# Patient Record
Sex: Female | Born: 1935 | Race: White | Hispanic: No | Smoking: Former smoker
Health system: Southern US, Community
[De-identification: ages and names within clinical notes are randomized; demographics above are authoritative.]

## PROBLEM LIST (undated history)

## (undated) DIAGNOSIS — E785 Hyperlipidemia, unspecified: Secondary | ICD-10-CM

## (undated) DIAGNOSIS — C801 Malignant (primary) neoplasm, unspecified: Secondary | ICD-10-CM

## (undated) DIAGNOSIS — D649 Anemia, unspecified: Secondary | ICD-10-CM

## (undated) DIAGNOSIS — F419 Anxiety disorder, unspecified: Secondary | ICD-10-CM

## (undated) DIAGNOSIS — S6290XA Unspecified fracture of unspecified wrist and hand, initial encounter for closed fracture: Secondary | ICD-10-CM

## (undated) DIAGNOSIS — F32A Depression, unspecified: Secondary | ICD-10-CM

## (undated) DIAGNOSIS — E119 Type 2 diabetes mellitus without complications: Secondary | ICD-10-CM

## (undated) DIAGNOSIS — I10 Essential (primary) hypertension: Secondary | ICD-10-CM

## (undated) DIAGNOSIS — F329 Major depressive disorder, single episode, unspecified: Secondary | ICD-10-CM

## (undated) DIAGNOSIS — C50912 Malignant neoplasm of unspecified site of left female breast: Secondary | ICD-10-CM

## (undated) HISTORY — DX: Unspecified fracture of unspecified wrist and hand, initial encounter for closed fracture: S62.90XA

## (undated) HISTORY — DX: Anemia, unspecified: D64.9

## (undated) HISTORY — PX: MASTECTOMY: SHX3

## (undated) HISTORY — PX: APPENDECTOMY: SHX54

## (undated) HISTORY — DX: Malignant neoplasm of unspecified site of left female breast: C50.912

---

## 2010-08-19 ENCOUNTER — Other Ambulatory Visit: Payer: Self-pay | Admitting: Anesthesiology

## 2010-08-19 ENCOUNTER — Encounter (HOSPITAL_COMMUNITY): Payer: Medicare Other

## 2010-08-19 LAB — BASIC METABOLIC PANEL
BUN: 24 mg/dL — ABNORMAL HIGH (ref 6–23)
CO2: 21 mEq/L (ref 19–32)
Calcium: 10.8 mg/dL — ABNORMAL HIGH (ref 8.4–10.5)
Chloride: 101 mEq/L (ref 96–112)
Creatinine, Ser: 1.08 mg/dL (ref 0.4–1.2)

## 2010-08-26 ENCOUNTER — Ambulatory Visit (HOSPITAL_COMMUNITY)
Admission: RE | Admit: 2010-08-26 | Discharge: 2010-08-26 | Disposition: A | Discharge status: Home / Self Care | Payer: Medicare Other | Source: Ambulatory Visit | Attending: Ophthalmology | Admitting: Ophthalmology

## 2010-08-26 DIAGNOSIS — Z7982 Long term (current) use of aspirin: Secondary | ICD-10-CM | POA: Insufficient documentation

## 2010-08-26 DIAGNOSIS — H2510 Age-related nuclear cataract, unspecified eye: Secondary | ICD-10-CM | POA: Insufficient documentation

## 2010-08-26 DIAGNOSIS — Z01812 Encounter for preprocedural laboratory examination: Secondary | ICD-10-CM | POA: Insufficient documentation

## 2010-08-26 DIAGNOSIS — E119 Type 2 diabetes mellitus without complications: Secondary | ICD-10-CM | POA: Insufficient documentation

## 2010-08-26 DIAGNOSIS — I10 Essential (primary) hypertension: Secondary | ICD-10-CM | POA: Insufficient documentation

## 2010-08-26 DIAGNOSIS — Z79899 Other long term (current) drug therapy: Secondary | ICD-10-CM | POA: Insufficient documentation

## 2010-08-26 HISTORY — PX: CATARACT EXTRACTION W/ INTRAOCULAR LENS IMPLANT: SHX1309

## 2010-09-03 ENCOUNTER — Emergency Department (HOSPITAL_COMMUNITY)
Admission: EM | Admit: 2010-09-03 | Discharge: 2010-09-03 | Disposition: A | Discharge status: Home / Self Care | Payer: Medicare Other | Attending: Emergency Medicine | Admitting: Emergency Medicine

## 2010-09-03 ENCOUNTER — Emergency Department (HOSPITAL_COMMUNITY): Payer: Medicare Other

## 2010-09-03 DIAGNOSIS — E7800 Pure hypercholesterolemia, unspecified: Secondary | ICD-10-CM | POA: Insufficient documentation

## 2010-09-03 DIAGNOSIS — E119 Type 2 diabetes mellitus without complications: Secondary | ICD-10-CM | POA: Insufficient documentation

## 2010-09-03 DIAGNOSIS — F329 Major depressive disorder, single episode, unspecified: Secondary | ICD-10-CM | POA: Insufficient documentation

## 2010-09-03 DIAGNOSIS — E860 Dehydration: Secondary | ICD-10-CM | POA: Insufficient documentation

## 2010-09-03 DIAGNOSIS — I10 Essential (primary) hypertension: Secondary | ICD-10-CM | POA: Insufficient documentation

## 2010-09-03 DIAGNOSIS — Z882 Allergy status to sulfonamides status: Secondary | ICD-10-CM | POA: Insufficient documentation

## 2010-09-03 DIAGNOSIS — Z881 Allergy status to other antibiotic agents status: Secondary | ICD-10-CM | POA: Insufficient documentation

## 2010-09-03 DIAGNOSIS — F3289 Other specified depressive episodes: Secondary | ICD-10-CM | POA: Insufficient documentation

## 2010-09-03 LAB — DIFFERENTIAL
Basophils Absolute: 0 K/uL (ref 0.0–0.1)
Basophils Relative: 0 % (ref 0–1)
Eosinophils Absolute: 0 10*3/uL (ref 0.0–0.7)
Eosinophils Relative: 0 % (ref 0–5)
Lymphocytes Relative: 21 % (ref 12–46)
Lymphs Abs: 1.3 10*3/uL (ref 0.7–4.0)
Monocytes Absolute: 0.5 10*3/uL (ref 0.1–1.0)
Monocytes Relative: 8 % (ref 3–12)
Neutro Abs: 4.4 K/uL (ref 1.7–7.7)
Neutrophils Relative %: 71 % (ref 43–77)

## 2010-09-03 LAB — CK TOTAL AND CKMB (NOT AT ARMC)
CK, MB: 1.4 ng/mL (ref 0.3–4.0)
Relative Index: INVALID (ref 0.0–2.5)
Total CK: 44 U/L (ref 7–177)

## 2010-09-03 LAB — URINALYSIS, ROUTINE W REFLEX MICROSCOPIC
Bilirubin Urine: NEGATIVE
Glucose, UA: NEGATIVE mg/dL
Hgb urine dipstick: NEGATIVE
Ketones, ur: NEGATIVE mg/dL
Leukocytes, UA: NEGATIVE
Nitrite: NEGATIVE
Protein, ur: NEGATIVE mg/dL
Specific Gravity, Urine: >1.03 — ABNORMAL HIGH (ref 1.005–1.030)
Urobilinogen, UA: 0.2 mg/dL (ref 0.0–1.0)
pH: 5.5 (ref 5.0–8.0)

## 2010-09-03 LAB — CBC
HCT: 35.3 % — ABNORMAL LOW (ref 36.0–46.0)
Hemoglobin: 12.2 g/dL (ref 12.0–15.0)
MCH: 31.4 pg (ref 26.0–34.0)
MCHC: 34.6 g/dL (ref 30.0–36.0)
MCV: 91 fL (ref 78.0–100.0)
Platelets: 200 10*3/uL (ref 150–400)
RBC: 3.88 MIL/uL (ref 3.87–5.11)
RDW: 13.4 % (ref 11.5–15.5)
WBC: 6.2 K/uL (ref 4.0–10.5)

## 2010-09-03 LAB — BASIC METABOLIC PANEL
BUN: 18 mg/dL (ref 6–23)
Calcium: 10.6 mg/dL — ABNORMAL HIGH (ref 8.4–10.5)
GFR calc Af Amer: 50 mL/min — ABNORMAL LOW (ref >60)
GFR calc non Af Amer: 41 mL/min — ABNORMAL LOW (ref >60)
Potassium: 4.2 mEq/L (ref 3.5–5.1)

## 2010-09-03 LAB — BASIC METABOLIC PANEL WITH GFR
CO2: 22 meq/L (ref 19–32)
Chloride: 96 meq/L (ref 96–112)
Creatinine, Ser: 1.26 mg/dL — ABNORMAL HIGH (ref 0.50–1.10)
Glucose, Bld: 142 mg/dL — ABNORMAL HIGH (ref 70–99)
Sodium: 131 meq/L — ABNORMAL LOW (ref 135–145)

## 2010-09-03 LAB — HEPATIC FUNCTION PANEL
ALT: 32 U/L (ref 0–35)
AST: 27 U/L (ref 0–37)
Albumin: 4 g/dL (ref 3.5–5.2)
Alkaline Phosphatase: 75 U/L (ref 39–117)
Bilirubin, Direct: 0.1 mg/dL (ref 0.0–0.3)
Indirect Bilirubin: 0.6 mg/dL (ref 0.3–0.9)
Total Bilirubin: 0.7 mg/dL (ref 0.3–1.2)
Total Protein: 7.7 g/dL (ref 6.0–8.3)

## 2010-09-03 LAB — TROPONIN I: Troponin I: <0.3 ng/mL (ref <0.30)

## 2010-09-04 ENCOUNTER — Other Ambulatory Visit (HOSPITAL_COMMUNITY): Payer: Self-pay

## 2010-09-05 ENCOUNTER — Inpatient Hospital Stay (HOSPITAL_COMMUNITY): Payer: Medicare Other

## 2010-09-05 ENCOUNTER — Emergency Department (HOSPITAL_COMMUNITY): Payer: Medicare Other

## 2010-09-05 ENCOUNTER — Inpatient Hospital Stay (HOSPITAL_COMMUNITY)
Admission: EM | Admit: 2010-09-05 | Discharge: 2010-09-09 | DRG: 629 | Disposition: A | Discharge status: Home / Self Care | Payer: Medicare Other | Attending: Internal Medicine | Admitting: Internal Medicine

## 2010-09-05 DIAGNOSIS — E119 Type 2 diabetes mellitus without complications: Secondary | ICD-10-CM | POA: Diagnosis present

## 2010-09-05 DIAGNOSIS — D649 Anemia, unspecified: Secondary | ICD-10-CM | POA: Diagnosis present

## 2010-09-05 DIAGNOSIS — R509 Fever, unspecified: Secondary | ICD-10-CM | POA: Diagnosis present

## 2010-09-05 DIAGNOSIS — C50919 Malignant neoplasm of unspecified site of unspecified female breast: Secondary | ICD-10-CM | POA: Diagnosis present

## 2010-09-05 DIAGNOSIS — N179 Acute kidney failure, unspecified: Secondary | ICD-10-CM | POA: Diagnosis present

## 2010-09-05 DIAGNOSIS — D696 Thrombocytopenia, unspecified: Secondary | ICD-10-CM | POA: Diagnosis present

## 2010-09-05 DIAGNOSIS — I10 Essential (primary) hypertension: Secondary | ICD-10-CM | POA: Diagnosis present

## 2010-09-05 DIAGNOSIS — E871 Hypo-osmolality and hyponatremia: Principal | ICD-10-CM | POA: Diagnosis present

## 2010-09-05 DIAGNOSIS — E785 Hyperlipidemia, unspecified: Secondary | ICD-10-CM | POA: Diagnosis present

## 2010-09-05 DIAGNOSIS — F341 Dysthymic disorder: Secondary | ICD-10-CM | POA: Diagnosis present

## 2010-09-05 HISTORY — PX: INCISIONAL BREAST BIOPSY: SHX1812

## 2010-09-05 LAB — DIFFERENTIAL
Basophils Absolute: 0 10*3/uL (ref 0.0–0.1)
Basophils Relative: 1 % (ref 0–1)
Eosinophils Absolute: 0.1 10*3/uL (ref 0.0–0.7)
Eosinophils Relative: 2 % (ref 0–5)
Neutrophils Relative %: 58 % (ref 43–77)

## 2010-09-05 LAB — COMPREHENSIVE METABOLIC PANEL
Albumin: 3.4 g/dL — ABNORMAL LOW (ref 3.5–5.2)
BUN: 22 mg/dL (ref 6–23)
CO2: 23 mEq/L (ref 19–32)
Chloride: 94 mEq/L — ABNORMAL LOW (ref 96–112)
Creatinine, Ser: 1.35 mg/dL — ABNORMAL HIGH (ref 0.50–1.10)
GFR calc Af Amer: 46 mL/min — ABNORMAL LOW (ref >60)
GFR calc non Af Amer: 38 mL/min — ABNORMAL LOW (ref >60)
Glucose, Bld: 139 mg/dL — ABNORMAL HIGH (ref 70–99)
Total Bilirubin: 0.7 mg/dL (ref 0.3–1.2)

## 2010-09-05 LAB — URINALYSIS, ROUTINE W REFLEX MICROSCOPIC
Bilirubin Urine: NEGATIVE
Glucose, UA: NEGATIVE mg/dL
Ketones, ur: NEGATIVE mg/dL
Leukocytes, UA: NEGATIVE
Nitrite: NEGATIVE
Specific Gravity, Urine: 1.025 (ref 1.005–1.030)
Urobilinogen, UA: 0.2 mg/dL (ref 0.0–1.0)
pH: 5 (ref 5.0–8.0)

## 2010-09-05 LAB — LIPASE, BLOOD: Lipase: 42 U/L (ref 11–59)

## 2010-09-05 LAB — CK TOTAL AND CKMB (NOT AT ARMC)
CK, MB: 1.4 ng/mL (ref 0.3–4.0)
Relative Index: INVALID (ref 0.0–2.5)
Total CK: 49 U/L (ref 7–177)

## 2010-09-05 LAB — CBC
HCT: 36 % (ref 36.0–46.0)
Hemoglobin: 12.4 g/dL (ref 12.0–15.0)
MCH: 31.5 pg (ref 26.0–34.0)
MCHC: 34.4 g/dL (ref 30.0–36.0)
MCV: 91.4 fL (ref 78.0–100.0)
Platelets: 101 K/uL — ABNORMAL LOW (ref 150–400)
RBC: 3.94 MIL/uL (ref 3.87–5.11)
RDW: 13.5 % (ref 11.5–15.5)
WBC: 5.4 K/uL (ref 4.0–10.5)

## 2010-09-05 LAB — TROPONIN I: Troponin I: <0.3 ng/mL (ref <0.30)

## 2010-09-05 LAB — GLUCOSE, CAPILLARY: Glucose-Capillary: 148 mg/dL — ABNORMAL HIGH (ref 70–99)

## 2010-09-06 LAB — CBC
HCT: 30.3 % — ABNORMAL LOW (ref 36.0–46.0)
MCH: 31.4 pg (ref 26.0–34.0)
MCHC: 34.7 g/dL (ref 30.0–36.0)
MCV: 90.7 fL (ref 78.0–100.0)
Platelets: 83 10*3/uL — ABNORMAL LOW (ref 150–400)
RDW: 13.5 % (ref 11.5–15.5)
WBC: 4.2 10*3/uL (ref 4.0–10.5)

## 2010-09-06 LAB — GLUCOSE, CAPILLARY
Glucose-Capillary: 143 mg/dL — ABNORMAL HIGH (ref 70–99)
Glucose-Capillary: 119 mg/dL — ABNORMAL HIGH (ref 70–99)
Glucose-Capillary: 99 mg/dL (ref 70–99)

## 2010-09-06 LAB — BASIC METABOLIC PANEL
BUN: 21 mg/dL (ref 6–23)
Chloride: 102 mEq/L (ref 96–112)
GFR calc Af Amer: 46 mL/min — ABNORMAL LOW (ref >60)
GFR calc non Af Amer: 38 mL/min — ABNORMAL LOW (ref >60)
Potassium: 4.2 mEq/L (ref 3.5–5.1)
Sodium: 134 mEq/L — ABNORMAL LOW (ref 135–145)

## 2010-09-06 LAB — DIFFERENTIAL
Eosinophils Absolute: 0 10*3/uL (ref 0.0–0.7)
Eosinophils Relative: 0 % (ref 0–5)
Lymphocytes Relative: 27 % (ref 12–46)
Lymphs Abs: 1.2 10*3/uL (ref 0.7–4.0)
Monocytes Absolute: 0.3 10*3/uL (ref 0.1–1.0)

## 2010-09-06 LAB — HEMOGLOBIN A1C: Mean Plasma Glucose: 134 mg/dL — ABNORMAL HIGH (ref <117)

## 2010-09-06 LAB — HEPATIC FUNCTION PANEL
AST: 65 U/L — ABNORMAL HIGH (ref 0–37)
Bilirubin, Direct: 0.3 mg/dL (ref 0.0–0.3)
Indirect Bilirubin: 0.6 mg/dL (ref 0.3–0.9)
Total Bilirubin: 0.9 mg/dL (ref 0.3–1.2)

## 2010-09-06 LAB — FOLATE: Folate: 19.6 ng/mL

## 2010-09-06 LAB — TSH: TSH: 0.898 u[IU]/mL (ref 0.350–4.500)

## 2010-09-06 NOTE — Group Therapy Note (Signed)
  NAME:  Stacy [name], Stacy [name]                 ACCOUNT NO.:  [id number]  MEDICAL RECORD NO.:  [id number]  LOCATION:  A301                          FACILITY:  [facility]  PHYSICIAN:  Wilson Singer, M.D.DATE OF BIRTH:  October 08, 1931  DATE OF PROCEDURE:  09/06/2010 DATE OF DISCHARGE:                                PROGRESS NOTE   HISTORY OF PRESENT ILLNESS:  This 77 year old lady with a history of diabetes, was admitted with weakness, nausea, and vomiting.  She was found to have a left breast mass by Dr. Sherrie Mustache and indeed CT scan of her chest yesterday confirmed that there is a mass-like density in the left breast, upper inner quadrant with extension into the overlying skin, left nipple retraction and possible extension to the underlying pectoralis muscle.  There is also an abnormal left axillary lymph node. This is concerning for breast cancer.  She herself feels better today with no nausea or vomiting and she has been afebrile.  PHYSICAL EXAMINATION:  Temperature 98.5, blood pressure 100/66, pulse 74, saturation 93% on room air.  She does look systemically well. HEART:  Sounds are present and normal.  Lung fields are clear.  ABDOMEN: Soft and nontender.  She appeared to be keen to examine her left breast again today.  On investigations, sodium 134, potassium 4.2, bicarbonate 22, BUN 21, creatinine 1.36, calcium 9.0, albumin is 3.0.  AST and ALT slightly elevated at 65 and 58 respectively.  Hemoglobin 10.5, white blood cell count 4.2, platelets 83.  CT scan of her abdomen was rather unremarkable with no acute intra-abdominal pelvic process and no evidence of hepatic breast metastases or any other lymphadenopathy.  IMPRESSION: 1. Nausea, vomiting with fever, unclear etiology, improving. 2. Hyponatremia, improving with rehydration. 3. Acute renal failure, improving with rehydration. 4. Thrombocytopenia.  We will monitor. 5. Left breast mass concerning for malignancy. 6. Hypertension, well  controlled.  PLAN: 1. Surgical consultation as I really think is malignancy and I think a     mammogram may not add much to the management here. 2. Continue with intravenous antibiotics and IV fluids for the time     being and monitor diabetes.     Wilson Singer, M.D.     NCG/MEDQ  D:  09/06/2010  T:  09/06/2010  Job:  [id number]  Electronically Signed by Lilly Cove M.D. on 09/06/2010 11:33:55 AM

## 2010-09-07 LAB — BASIC METABOLIC PANEL
BUN: 17 mg/dL (ref 6–23)
CO2: 21 mEq/L (ref 19–32)
Chloride: 103 mEq/L (ref 96–112)
Creatinine, Ser: 1.39 mg/dL — ABNORMAL HIGH (ref 0.50–1.10)
GFR calc Af Amer: 45 mL/min — ABNORMAL LOW (ref >60)
Glucose, Bld: 113 mg/dL — ABNORMAL HIGH (ref 70–99)
Potassium: 4 mEq/L (ref 3.5–5.1)

## 2010-09-07 LAB — URINE CULTURE
Colony Count: NO GROWTH
Culture  Setup Time: 201206230120

## 2010-09-07 LAB — CBC
HCT: 27 % — ABNORMAL LOW (ref 36.0–46.0)
Hemoglobin: 9.4 g/dL — ABNORMAL LOW (ref 12.0–15.0)
MCV: 90.6 fL (ref 78.0–100.0)
RBC: 2.98 MIL/uL — ABNORMAL LOW (ref 3.87–5.11)
WBC: 4.2 10*3/uL (ref 4.0–10.5)

## 2010-09-07 LAB — OSMOLALITY, URINE: Osmolality, Ur: 525 mOsm/kg (ref 390–1090)

## 2010-09-07 LAB — DIFFERENTIAL
Basophils Relative: 2 % — ABNORMAL HIGH (ref 0–1)
Eosinophils Relative: 0 % (ref 0–5)
Lymphocytes Relative: 46 % (ref 12–46)
Monocytes Absolute: 0.5 10*3/uL (ref 0.1–1.0)
Monocytes Relative: 11 % (ref 3–12)
Neutrophils Relative %: 41 % — ABNORMAL LOW (ref 43–77)

## 2010-09-07 LAB — GLUCOSE, CAPILLARY: Glucose-Capillary: 110 mg/dL — ABNORMAL HIGH (ref 70–99)

## 2010-09-07 LAB — SURGICAL PCR SCREEN: Staphylococcus aureus: NEGATIVE

## 2010-09-07 NOTE — Group Therapy Note (Signed)
  [name], [name]                 ACCOUNT NO.:  [id number]  MEDICAL RECORD NO.:  [id number]  LOCATION:                                 FACILITY:  PHYSICIAN:  Wilson Singer, M.D.DATE OF BIRTH:  06/04/1931  DATE OF PROCEDURE:  09/07/2010 DATE OF DISCHARGE:                                PROGRESS NOTE   This lady feels better.  She had fevers yesterday and I elected to switch her to doxycycline as the antibiotic of choice and stop the other antibiotics.  She has had no further fevers since that time and she actually feels much improved.  I appreciate Dr. Leticia [name] seeing this patient and from what I can make out, it appears that she may be proceeding to a breast biopsy.  Also, he has asked Oncology to see her which I think is highly reasonable.  PHYSICAL EXAMINATION:  VITAL SIGNS:  Temperature 97.8, blood pressure 98/58, pulse 99, saturation 94% on room air. GENERAL:  She looks systemically well and is not clinically shocked despite the soft blood pressure. LUNGS:  Lung fields are clear. HEART:  Heart sounds are present and normal. BREASTS:  Examination of the left breast does show a fairly hard left breast lump consistent with malignancy.  INVESTIGATIONS:  Sodium stable at 133, potassium 4.0, bicarbonate 21, BUN 17, creatinine 1.39.  Hemoglobin 9.4, white blood cell count 4.2, platelets 70; also stable.  Hemoglobin A1c is 6.3% and her CBGs are actually quite reasonable.  IMPRESSION: 1. Nausea and vomiting, resolved. 2. Fever, defervesced for the time being. 3. Hyponatremia, improving. 4. Acute renal failure, stable now. 5. Thrombocytopenia, stable but we will need to monitor. 6. Probable left breast cancer.  PLAN: 1. Decrease intravenous fluids to [facility] and encourage oral fluids. 2. Possible breast mass biopsy tomorrow. 3. I agree with Oncology consultation at this stage.     Wilson Singer, M.D.     NCG/MEDQ  D:  09/07/2010  T:  09/07/2010  Job:   [id number]  Electronically Signed by Lilly Cove M.D. on 09/07/2010 10:45:26 AM

## 2010-09-08 DIAGNOSIS — G939 Disorder of brain, unspecified: Secondary | ICD-10-CM

## 2010-09-08 LAB — COMPREHENSIVE METABOLIC PANEL
AST: 67 U/L — ABNORMAL HIGH (ref 0–37)
Albumin: 2.6 g/dL — ABNORMAL LOW (ref 3.5–5.2)
Alkaline Phosphatase: 65 U/L (ref 39–117)
BUN: 15 mg/dL (ref 6–23)
CO2: 22 mEq/L (ref 19–32)
Chloride: 108 mEq/L (ref 96–112)
Creatinine, Ser: 1.22 mg/dL — ABNORMAL HIGH (ref 0.50–1.10)
GFR calc non Af Amer: 43 mL/min — ABNORMAL LOW (ref >60)
Potassium: 4.1 mEq/L (ref 3.5–5.1)
Total Bilirubin: 0.3 mg/dL (ref 0.3–1.2)

## 2010-09-08 LAB — DIFFERENTIAL
Basophils Absolute: 0.1 10*3/uL (ref 0.0–0.1)
Basophils Relative: 1 % (ref 0–1)
Eosinophils Absolute: 0 10*3/uL (ref 0.0–0.7)
Monocytes Absolute: 0.9 10*3/uL (ref 0.1–1.0)
Monocytes Relative: 18 % — ABNORMAL HIGH (ref 3–12)
Neutrophils Relative %: 26 % — ABNORMAL LOW (ref 43–77)

## 2010-09-08 LAB — GLUCOSE, CAPILLARY
Glucose-Capillary: 118 mg/dL — ABNORMAL HIGH (ref 70–99)
Glucose-Capillary: 139 mg/dL — ABNORMAL HIGH (ref 70–99)

## 2010-09-08 LAB — CBC
MCH: 31.2 pg (ref 26.0–34.0)
MCV: 90.6 fL (ref 78.0–100.0)
Platelets: 77 10*3/uL — ABNORMAL LOW (ref 150–400)
RDW: 13.8 % (ref 11.5–15.5)

## 2010-09-08 LAB — LACTATE DEHYDROGENASE: LDH: 308 U/L — ABNORMAL HIGH (ref 94–250)

## 2010-09-08 NOTE — Group Therapy Note (Signed)
  Stacy [name], Stacy [name]                 ACCOUNT NO.:  [id number]  MEDICAL RECORD NO.:  [id number]  LOCATION:  A301                          FACILITY:  [facility]  PHYSICIAN:  Wilson Singer, M.D.DATE OF BIRTH:  1931-09-23  DATE OF PROCEDURE:  09/08/2010 DATE OF DISCHARGE:                                PROGRESS NOTE   SUBJECTIVE:  This lady is due to have a left breast biopsy today by Dr. Leticia Penna.  She feels much better.  She has had no fevers in the last 24- 36 hours now.  She is eating well.  OBJECTIVE:  Vital Signs:  Temperature 98, blood pressure 132/69, pulse 68, saturation 98% on room air.  There are no new physical findings today.  Investigations:  Hemoglobin 9.6, white blood cell count 4.9, platelets 77, which is improving slowly.  Sodium 138, potassium 4.1, bicarbonate 22, BUN 15, creatinine almost normal at 1.22.  IMPRESSION: 1. Hyponatremia, resolved. 2. Fever, resolved. 3. Acute renal failure, improved. 4. Thrombocytopenia, improving. 5. Left breast mass, probably cancer.  Biopsy today.  PLAN: 1. Breast biopsy today. 2. Possible discharge to home today or tomorrow depending on clinical     course today.     Wilson Singer, M.D.     NCG/MEDQ  D:  09/08/2010  T:  09/08/2010  Job:  [id number]  Electronically Signed by Lilly Cove M.D. on 09/08/2010 11:03:00 AM

## 2010-09-09 ENCOUNTER — Ambulatory Visit (HOSPITAL_COMMUNITY): Admission: RE | Admit: 2010-09-09 | Payer: Medicare Other | Source: Ambulatory Visit | Admitting: Ophthalmology

## 2010-09-09 ENCOUNTER — Inpatient Hospital Stay (HOSPITAL_COMMUNITY): Payer: Medicare Other

## 2010-09-09 ENCOUNTER — Other Ambulatory Visit: Payer: Self-pay | Admitting: General Surgery

## 2010-09-09 DIAGNOSIS — N63 Unspecified lump in unspecified breast: Secondary | ICD-10-CM

## 2010-09-09 LAB — GLUCOSE, CAPILLARY: Glucose-Capillary: 137 mg/dL — ABNORMAL HIGH (ref 70–99)

## 2010-09-09 LAB — DIFFERENTIAL
Eosinophils Relative: 1 % (ref 0–5)
Lymphocytes Relative: 66 % — ABNORMAL HIGH (ref 12–46)
Monocytes Absolute: 0.6 10*3/uL (ref 0.1–1.0)
Monocytes Relative: 12 % (ref 3–12)
Neutro Abs: 1 10*3/uL — ABNORMAL LOW (ref 1.7–7.7)

## 2010-09-09 LAB — COMPREHENSIVE METABOLIC PANEL
ALT: 60 U/L — ABNORMAL HIGH (ref 0–35)
AST: 61 U/L — ABNORMAL HIGH (ref 0–37)
Albumin: 2.6 g/dL — ABNORMAL LOW (ref 3.5–5.2)
Calcium: 10.1 mg/dL (ref 8.4–10.5)
Chloride: 108 mEq/L (ref 96–112)
Creatinine, Ser: 1.11 mg/dL — ABNORMAL HIGH (ref 0.50–1.10)
Sodium: 139 mEq/L (ref 135–145)

## 2010-09-09 LAB — CBC
HCT: 27 % — ABNORMAL LOW (ref 36.0–46.0)
Hemoglobin: 9.3 g/dL — ABNORMAL LOW (ref 12.0–15.0)
MCH: 31.1 pg (ref 26.0–34.0)
MCV: 90.3 fL (ref 78.0–100.0)
RBC: 2.99 MIL/uL — ABNORMAL LOW (ref 3.87–5.11)

## 2010-09-09 LAB — CANCER ANTIGEN 27.29: CA 27.29: 12 U/mL (ref 0–39)

## 2010-09-09 MED ORDER — TECHNETIUM TC 99M MEDRONATE IV KIT
25.0000 | PACK | Freq: Once | INTRAVENOUS | Status: AC | PRN
  Administered 2010-09-09: 24.5 via INTRAVENOUS

## 2010-09-09 NOTE — H&P (Signed)
Stacy [name], HOFFERT NO.:  [id number]  MEDICAL RECORD NO.:  [id number]  LOCATION:  A307                          FACILITY:  [facility]  PHYSICIAN:  Elliot Cousin, M.D.    DATE OF BIRTH:  1931-10-12  DATE OF ADMISSION:  09/05/2010 DATE OF DISCHARGE:  LH                             HISTORY & PHYSICAL   PRIMARY CARE PHYSICIAN:  The patient is unassigned.  CHIEF COMPLAINT:  Generalized weakness, nausea, and vomiting.  HISTORY OF PRESENT ILLNESS:  The patient is a 77 year old woman with a past medical history significant for type 2 diabetes mellitus, depression, anxiety, and hyperlipidemia.  She presents to the emergency department today with a chief complaint of generalized weakness, and one episode of nausea and vomiting this morning.  She originally presented to the emergency department at [facility] on September 03, 2010. Following the evaluation, she was told that she was dehydrated.  She was discharged to home with instructions to increase her fluid intake. Approximately 2-3 weeks ago, she presented to the emergency department at [facility] for a chief complaint of weakness. According to her daughter, [name], they ordered a number of studies including radiographic studies and laboratory studies.  She ruled out for a myocardial infarction.  There was no evidence of PE on the V/Q scan.  She was, however, diagnosed with a urinary tract infection.  She was discharged from the emergency department on Cipro. The patient took Cipro for a couple of days but developed nausea and then subsequently stopped it.  She was given intravenous Cipro at [facility].  Two days after she was discharged from the emergency department, the patient noticed swelling and redness at the site of the previous IV.  She went to a local [facility] and was diagnosed with cellulitis.  She was then started on Bactrim which she took for a couple of  days but then subsequently stopped because of nausea.  She was also told that she was anemic.  She was started on oral iron therapy.  She stopped this as well due to nausea.  Currently, she has no complaints of pain.  She does complain of generalized weakness.  She had nausea and one bout of vomiting this morning.  She denies subjective fever or chills.  She denies focal unilateral weakness.  She denies headache, upper respiratory infection symptoms, cough, chest pain, shortness of breath, diarrhea, abdominal pain, painful urination and, swelling in her legs.  She has been drinking more water as instructed by the emergency department physician several days ago.  In addition to the medications above, metformin was discontinued several weeks ago after her primary care physician explained that her blood sugars were well within normal limits on diet alone.  In the emergency department, the patient is noted to be hemodynamically stable and initially afebrile.  Following the completion of my history and physical, the registered nurse informed me that the patient had developed a fever of 103.1.  Her lab data are significant a normal WBC, platelet count of 101, normal lipase, serum sodium of 127, BUN of 22, creatinine of 1.35, SGOT of 66, and SGPT of 59.  The ultrasound of her abdomen reveals hepatic steatosis but otherwise normal gallbladder. Her acute abdominal series is unremarkable.  She is being admitted for further evaluation and management.  PAST MEDICAL HISTORY: 1. Type 2 diabetes mellitus. 2. Depression with anxiety. 3. Hyperlipidemia. 4. Status post appendectomy. 5. Status post right cataract surgery recently. 6. Obesity. 7. Hypertension.  MEDICATIONS: 1. As mentioned above, Cipro, ferrous sulfate, and sulfa were recently     taken, but ultimately discontinued several days later due to     nausea. 2. Lexapro 10 mg daily. 3. Crestor 5 mg daily. 4.  Lisinopril/hydrochlorothiazide 20/25 mg once daily. 5. Aspirin 81 mg daily. 6. Pepcid 20 mg daily.  ALLERGIES:  The patient now has intolerances to Cipro, ferrous sulfate and sulfa drugs all of which cause nausea.  SOCIAL HISTORY:  The patient is married.  She lives in [place], [place].  She has one daughter, [name].  The patient is retired. She still drives.  She denies alcohol, tobacco, and illicit drug use.  FAMILY HISTORY:  Her mother died of a heart attack.  Her father died of chemical poisoning.  REVIEW OF SYSTEMS:  As above in history present illness.  Otherwise review of systems is negative.  PHYSICAL EXAMINATION:  VITAL SIGNS:  Temperature now 103.1, blood pressure 120/43, pulse 84, respiratory rate 20, and oxygen saturation 95% on room air. GENERAL:  The patient is a pleasant alert 77 year old obese Caucasian woman who is currently lying in bed in no acute distress, although she does appear ill. HEENT:  Head is normocephalic and nontraumatic.  Pupils equal, round, and reactive to light.  Extraocular muscles are intact.  Conjunctivae are clear.  Sclerae are white.  Tympanic membranes are clear bilaterally.  Nasal mucosa is dry.  No sinus tenderness.  Oropharynx reveals mildly dry mucous membranes.  No posterior exudates or erythema. NECK:  Supple and obese.  No adenopathy, no thyromegaly, no bruit or JVD. LUNGS:  Clear to auscultation bilaterally. BREASTS:  The left breast has an inverted nipple and a hardened mass around the areola which is nontender.  The right breast is soft with no masses palpated. HEART:  S1-S2 with no murmurs, rubs, or gallops. ABDOMEN:  Obese, positive bowel sounds, soft, nontender, and nondistended.  No hepatosplenomegaly, no masses palpated. GU/RECTAL:  Deferred. EXTREMITIES:  Pedal pulses are palpable bilaterally.  No pretibial edema.  No pedal edema. NEUROLOGIC:  The patient is alert and oriented x3.  Cranial nerves II- XII are  intact.  Strength is 5/5 throughout grossly.  Sensation is intact.  ADMISSION LABORATORY DATA:  EKG is pending.  The results of the ultrasound of the abdomen and acute abdominal series were dictated above.  Urinalysis reveals trace of blood and trace of protein, otherwise within normal limits.  WBC 5.4, hemoglobin 12.4, and platelet count 101.  Lipase 42.  Sodium 127, potassium 4.1, chloride 94, CO2 23, glucose 139, BUN 22, and creatinine 1.35.  Total bilirubin 0.7, alkaline phosphatase 78, SGOT 66, SGPT 59, total protein 7.1, albumin 3.4, and calcium 9.5.  ASSESSMENT: 1. Generalized weakness with associated nausea and vomiting.  The     etiology is unclear at this time.  She has no complaints of     abdominal pain or diarrhea.  She has no focal weakness that would     suggest a neurological event. 2. New onset fever.  The patient was initially afebrile but became     febrile with temperature of 103.1 in the emergency department.  Her     chest x-ray is not suggestive of infection.  Her urinalysis is not     suggestive of infection.  There appears to be no other obvious     reason for her to have a fever at this point. 3. Hyponatremia.  The patient's serum sodium is 127.  In comparison, 2     days ago, it was 131.  Her hyponatremia may be secondary to     hypovolemia and/or volume depletion.  It may also be secondary to     her recent increase in water intake as was recommended a couple of     days ago in the emergency department. 4. Acute renal failure.  The patient's BUN is 22 and her creatinine is     1.35.  In comparison, on September 03, 2010, her BUN was 18 and her     creatinine was 1.26.  This is likely the consequence of prerenal     azotemia. 5. Thrombocytopenia.  The patient's platelet count is 101.  In     comparison, it was 200 on September 03, 2010.  The underlying etiology     is unclear at this time. 6. Mild hepatic transaminitis.  The patient's SGOT and SGPT are mildly      elevated.  In comparison, her liver transaminases were well within     normal limits on September 03, 2010.  The ultrasound of her abdomen     reveals hepatic steatosis.  This is likely the cause of her     elevated liver transaminases.  Otherwise, the ultrasound of her     abdomen was unremarkable. 7. Left breast mass.  Upon my examination, the patient's left breast     was dimpled around the areola.  There was an inverted nipple and a     hard mass around the nipple.  This is concerning for a malignancy.     Upon my conversation with the patient, she had noticed the inverted     nipple about 5 years ago.  She has never had a mammogram. 8. Type 2 diabetes mellitus.  The patient was recently taken off     metformin.  Her venous glucose today is reasonable. 9. Hypertension.  The patient's blood pressure is well-controlled.  PLAN: 1. We will start gentle IV fluids.  We will monitor her serum sodium.     We will check a urine osmolality and a serum osmolality.  If it     appears that her serum sodium is not improving on normal saline, we     will then discontinue the IV fluids and investigate SIADH. 2. We will start intravenous Protonix empirically.  We will treat her     nausea with as-needed Zofran. 3. Due to the recent fever, we will check blood cultures x2 and a     urine culture.  We will start empiric broad-spectrum antibiotic     treatment with Zosyn and vancomycin.  Given the finding of the left     breast mass, we will order a CT scan of the chest, abdomen, and     pelvis to investigate malignancy, metastatic disease, and source of     fever. 4. The patient will need an outpatient mammogram.  If she is still     hospitalized on Monday, September 08, 2010, a mammogram can be ordered     at [facility]. 5. For further evaluation, we will check a vitamin B12 level, folate  level, cardiac enzymes, TSH, and free T4.     Elliot Cousin, M.D.     DF/MEDQ  D:  09/05/2010  T:   09/05/2010  Job:  [id number]  Electronically Signed by Elliot Cousin M.D. on 09/09/2010 07:10:14 PM

## 2010-09-09 NOTE — Group Therapy Note (Signed)
  NAME:  Stacy [name], Stacy [name]                 ACCOUNT NO.:  [id number]  MEDICAL RECORD NO.:  [id number]  LOCATION:  A301                          FACILITY:  [facility]  PHYSICIAN:  Wilson Singer, M.D.DATE OF BIRTH:  10-Aug-1931  DATE OF PROCEDURE: DATE OF DISCHARGE:                                PROGRESS NOTE   This lady feels well and due to scheduling issues, she was not able to have a breast biopsy yesterday.  Dr. Vicente Serene Odogwu saw the patient in consultation and has recommended bone scan, mammograms, and ultrasound for completion of her workup.  LDH has been elevated, CA 27.29 is actually in the normal range.  PHYSICAL EXAMINATION:  Temperature 98.3, blood pressure 132/73, pulse 68, saturation 96% on room air.  There are no new physical findings today.  INVESTIGATIONS:  Hemoglobin 9.3, white blood cell count 5.0, platelets 87,000.  Sodium normal now at 139, potassium 4.0, bicarbonate 24, BUN 15, creatinine 1.11, albumin 2.6, LDH elevated at 308.  IMPRESSION: 1. Hyponatremia resolved. 2. Fever resolved with blood and urine cultures still negative. 3. Acute renal failure resolved. 4. Thrombocytopenia continues to improve slowly. 5. Left breast mass likely breast cancer.  PLAN: 1. Breast biopsy today. 2. Hopefully we can get bone scan and other studies done today. 3. Probable discharge tomorrow.     Wilson Singer, M.D.     NCG/MEDQ  D:  09/09/2010  T:  09/09/2010  Job:  [id number]  Electronically Signed by Lilly Cove M.D. on 09/09/2010 04:54:09 PM

## 2010-09-10 ENCOUNTER — Ambulatory Visit (HOSPITAL_COMMUNITY)
Admission: RE | Admit: 2010-09-10 | Discharge: 2010-09-10 | Disposition: A | Discharge status: Home / Self Care | Payer: Medicare Other | Source: Ambulatory Visit | Attending: Oncology | Admitting: Oncology

## 2010-09-10 ENCOUNTER — Ambulatory Visit (HOSPITAL_COMMUNITY): Admit: 2010-09-10 | Payer: Medicare Other

## 2010-09-10 ENCOUNTER — Other Ambulatory Visit (HOSPITAL_COMMUNITY): Payer: Self-pay | Admitting: Oncology

## 2010-09-10 DIAGNOSIS — N63 Unspecified lump in unspecified breast: Secondary | ICD-10-CM

## 2010-09-10 DIAGNOSIS — C50919 Malignant neoplasm of unspecified site of unspecified female breast: Secondary | ICD-10-CM | POA: Insufficient documentation

## 2010-09-10 LAB — CULTURE, BLOOD (ROUTINE X 2)
Culture: NO GROWTH
Culture: NO GROWTH

## 2010-09-11 NOTE — Discharge Summary (Signed)
Stacy [name], Stacy [name]                 ACCOUNT NO.:  [id number]  MEDICAL RECORD NO.:  [id number]  LOCATION:  A301                          FACILITY:  [facility]  PHYSICIAN:  Wilson Singer, M.D.DATE OF BIRTH:  22-Nov-1931  DATE OF ADMISSION:  09/05/2010 DATE OF DISCHARGE:  06/26/2012LH                              DISCHARGE SUMMARY   FINAL DISCHARGE DIAGNOSES: 1. Dehydration and hyponatremia, resolved. 2. Left breast mass, likely breast cancer, status post breast biopsy 3. Fever, resolved empirically treated with doxycycline. 4. Thrombocytopenia, improving. 5. Hypertension.  MEDICATIONS ON DISCHARGE: 1. Doxycycline 100 mg b.i.d. for one further week. 2. Norvasc 5 mg daily. 3. Aspirin 81 mg daily. 4. Crestor 5 mg daily. 5. Famotidine 20 mg daily. 6. Lexapro 10 mg daily. 7. Tylenol 325 mg p.r.n. 8. Discontinue lisinopril/HCTZ.  HISTORY:  This is a very pleasant 77 year old lady who was admitted with symptoms of generalized weakness, nausea and vomiting.  When she was admitted, she was found to be dehydrated and hyponatremic with a sodium of 127.  Please see initial history and physical examination done by Dr. Elliot Cousin.  HOSPITAL PROGRESS:  The patient was appropriately rehydrated with intravenous fluids and also during Dr. Theodis Aguas examination, there was a left breast mass found which was very significant.  The patient underwent CT scan of her chest and also abdomen.  The CT scan of her chest was very suggestive of malignancy as there was a mass in the left upper inner quadrant with extension in the overlying skin, left nipple retraction and possible extension to the underlying pectoralis muscle. Dr. Leticia Penna, surgeon saw the patient and performed a breast biopsy today which went without complications.  The patient also was seen by Dr. Arlan Organ, oncologist who recommended further workup.  The patient underwent a bone scan today which was normal and no evidence  of metastatic disease.  Mammogram is pending.  This needs to be done as an outpatient.  The patient also had a CT scan of the abdomen which was unremarkable for any pathology.  Today, she feels well.  She has no further nausea and vomiting.  PHYSICAL EXAMINATION:  VITAL SIGNS:  Temperature 97.8, blood pressure 157/73, pulse 61, saturation 95% on room air. GENERAL:  She looks systemically well. HEART:  Sounds are present and normal. LUNGS:  Fields are clear.  INVESTIGATIONS:  All cultures have been negative so far.  Hemoglobin 9.3, white blood cell count 5.0, platelets 87.  Sodium 139, potassium 4.0, bicarbonate 24, BUN 15, creatinine 1.11, albumin 2.6.  Tumor marker CA 27.29 was in the normal range of 12, LDH was raised at 308. Pathology of the left breast biopsy is awaited.  She will need to follow up with Dr. Leticia Penna regarding this.  DISPOSITION:  The patient is stable to be discharged home today and I have discontinued her antihypertensive medication on admission and switched to Norvasc 5 mg daily.  I have asked to seek a primary carephysician to make sure blood pressure is monitored and controlled. Also, she will need to follow up with Dr. Leticia Penna surgery and also Dr. Mariel Sleet, Oncology.     Wilson Singer, M.D.  NCG/MEDQ  D:  09/09/2010  T:  09/09/2010  Job:  [id number]  cc:   Dr. Bernadene Person. Mariel Sleet, MD Fax: [phone]  Dr. Suzette Battiest  Electronically Signed by Lilly Cove M.D. on 09/11/2010 01:00:57 PM

## 2010-09-16 ENCOUNTER — Other Ambulatory Visit (HOSPITAL_COMMUNITY): Payer: Self-pay | Admitting: Oncology

## 2010-09-16 ENCOUNTER — Encounter (HOSPITAL_COMMUNITY): Payer: Medicare Other | Attending: Oncology | Admitting: Oncology

## 2010-09-16 ENCOUNTER — Encounter (HOSPITAL_COMMUNITY): Payer: Self-pay | Admitting: *Deleted

## 2010-09-16 DIAGNOSIS — Z79899 Other long term (current) drug therapy: Secondary | ICD-10-CM | POA: Insufficient documentation

## 2010-09-16 DIAGNOSIS — C50919 Malignant neoplasm of unspecified site of unspecified female breast: Secondary | ICD-10-CM

## 2010-09-16 DIAGNOSIS — I10 Essential (primary) hypertension: Secondary | ICD-10-CM | POA: Insufficient documentation

## 2010-09-16 DIAGNOSIS — E119 Type 2 diabetes mellitus without complications: Secondary | ICD-10-CM | POA: Insufficient documentation

## 2010-09-16 LAB — CBC
Hemoglobin: 11.2 g/dL — ABNORMAL LOW (ref 12.0–15.0)
MCHC: 33.7 g/dL (ref 30.0–36.0)
RBC: 3.56 MIL/uL — ABNORMAL LOW (ref 3.87–5.11)
WBC: 6.8 10*3/uL (ref 4.0–10.5)

## 2010-09-16 LAB — COMPREHENSIVE METABOLIC PANEL
Albumin: 3.6 g/dL (ref 3.5–5.2)
Alkaline Phosphatase: 75 U/L (ref 39–117)
BUN: 30 mg/dL — ABNORMAL HIGH (ref 6–23)
CO2: 26 mEq/L (ref 19–32)
Chloride: 103 mEq/L (ref 96–112)
Potassium: 4 mEq/L (ref 3.5–5.1)
Total Bilirubin: 0.4 mg/dL (ref 0.3–1.2)

## 2010-09-16 LAB — DIFFERENTIAL
Basophils Relative: 0 % (ref 0–1)
Lymphocytes Relative: 44 % (ref 12–46)
Lymphs Abs: 3 10*3/uL (ref 0.7–4.0)
Monocytes Relative: 10 % (ref 3–12)
Neutro Abs: 3 10*3/uL (ref 1.7–7.7)
Neutrophils Relative %: 45 % (ref 43–77)
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
## 2010-09-17 LAB — CANCER ANTIGEN 27.29: CA 27.29: 23 U/mL (ref 0–39)

## 2010-09-19 ENCOUNTER — Ambulatory Visit (HOSPITAL_COMMUNITY)
Admission: RE | Admit: 2010-09-19 | Discharge: 2010-09-19 | Disposition: A | Discharge status: Home / Self Care | Payer: Medicare Other | Source: Ambulatory Visit | Attending: Oncology | Admitting: Oncology

## 2010-09-19 DIAGNOSIS — Z79899 Other long term (current) drug therapy: Secondary | ICD-10-CM | POA: Insufficient documentation

## 2010-09-19 DIAGNOSIS — I10 Essential (primary) hypertension: Secondary | ICD-10-CM | POA: Insufficient documentation

## 2010-09-19 DIAGNOSIS — D696 Thrombocytopenia, unspecified: Secondary | ICD-10-CM | POA: Insufficient documentation

## 2010-09-19 DIAGNOSIS — Z780 Asymptomatic menopausal state: Secondary | ICD-10-CM | POA: Insufficient documentation

## 2010-09-19 DIAGNOSIS — C50919 Malignant neoplasm of unspecified site of unspecified female breast: Secondary | ICD-10-CM | POA: Insufficient documentation

## 2010-09-19 DIAGNOSIS — E785 Hyperlipidemia, unspecified: Secondary | ICD-10-CM | POA: Insufficient documentation

## 2010-09-19 DIAGNOSIS — E119 Type 2 diabetes mellitus without complications: Secondary | ICD-10-CM | POA: Insufficient documentation

## 2010-09-20 NOTE — Progress Notes (Signed)
CC:   Stacy [name], M.D. Tilford Pillar, MD  DIAGNOSIS:  Advanced stage III cancer of the left breast with what is a suspicious node on CT scan but a large mass in the left breast with nipple retraction and possible involvement down to the chest wall muscle.  Status post biopsy which showed a strongly ER/PR positive tumor, 100% for each.  HER-2 is still pending at the time.  She has seen Dr. Michell Heinrich whom I have spoken with today by phone, and I talked to Dr. Leticia Penna in person about this lady who is nearly 55 and has a large mass which she ignored for at least 2 years.  IMPRESSION AND PLAN:  With her strong ER/PR positivity and her grade 1 disease, I think it is absolutely worth trying her on Femara for 4-6 months seeing if her operation could be made easier by that therapy with shrinkage.  We are all in agreement that that should be the course of therapy and will call in Femara today.  We will see her monthly and then probably after 4 months decide on a date for surgery as long as she has responded.  I think she will need radiation therapy either way, and with a modified radical mastectomy which Dr. Leticia Penna and I and Dr. Michell Heinrich all think she will probably need, lymph node dissection will be part of that procedure.  So we will proceed with hormonal therapy.  There is a chance that it might make her a lumpectomy candidate as it has done in some studies, but I am not sure we can count on that.  Will call in the Femara today and get her HER-2 status finalized.    ______________________________ Ladona Horns. Mariel Sleet, MD ESN/MEDQ  D:  09/19/2010  T:  09/20/2010  Job:  [id number]

## 2010-10-01 NOTE — Op Note (Signed)
  NAMEARILYN, BRIERLEY NO.:  [id number]  MEDICAL RECORD NO.:  [id number]  LOCATION:  A301                          FACILITY:  [facility]  PHYSICIAN:  Tilford Pillar, MD      DATE OF BIRTH:  Jul 19, 1931  DATE OF PROCEDURE:  09/09/2010 DATE OF DISCHARGE:  09/09/2010                              OPERATIVE REPORT   PREOPERATIVE DIAGNOSES:  Left breast mass.  POSTOPERATIVE DIAGNOSIS:  Left breast mass.  PROCEDURE:  Incisional breast biopsy of left breast mass.  SURGEON:  Tilford Pillar, MD.  ANESTHESIA:  Laryngeal mask airway (general).  SPECIMEN:  Left breast biopsy.  ESTIMATED BLOOD LOSS:  Minimal.  INDICATIONS:  The patient is a 77 year old female, who presented to [facility] for unrelated reason, was noted to have a palpable left breast mass and inverted nipple.  CT of the chest in the office demonstrated changes in the breast tissue, worrisome for breast cancer and lymphadenopathy on the left side.  At this point, risks, benefits, and alternatives of excisional breast biopsy were discussed at length with the patient for additional diagnosis of tumor markers.  Her questions and concerns were addressed.  The patient was consented for planned procedure.  OPERATION:  The patient was taken to the operating room, was placed in supine position on the operating table, at which time, general anesthetic was administered.  Once the patient was asleep, the laryngeal mask airway was placed.  At this point, her left breast was prepped with DuraPrep solution and draped in standard fashion.  An elliptical incision was created around the areola on the left upper outer quadrant with a 15 blade scalpel.  Additional dissection down through the subcuticular tissue was carried out using electrocautery.  This was carried out down to the palpable mass, a portion which is grasped with Allis clamp, and dissection was carried out to excise a plug in this tissue, approximated 1  cm x 1 cm specimen removed.  Hemostasis was excellent and this sent as a permanent specimen pathology.  At this time, I did irrigate the field and 3-0 Vicryl was utilized to reapproximate the deep subcuticular tissue and then a 4-0 Monocryl was utilized to reapproximate the skin edges in a running subcuticular suture.  The skin was washed and dried with moist and dry towel. Benzoin was applied around the incision.  Half inch Steri-Strips was placed.  Drapes removed.  The patient allowed to come out of general anesthetic and was transferred back to regular hospital bed in stable condition.  At the conclusion of procedure, all instruments, sponge, and needle counts were correct.  The patient tolerated the procedure well.     Tilford Pillar, MD     BZ/MEDQ  D:  09/09/2010  T:  09/10/2010  Job:  [id number]  Electronically Signed by Tilford Pillar MD on 10/01/2010 08:28:53 AM

## 2010-10-02 ENCOUNTER — Telehealth (HOSPITAL_COMMUNITY): Payer: Self-pay

## 2010-10-02 NOTE — Telephone Encounter (Signed)
Will call patient in the morning.  Not at home.

## 2010-10-03 ENCOUNTER — Telehealth (HOSPITAL_COMMUNITY): Payer: Self-pay

## 2010-10-03 DIAGNOSIS — C50912 Malignant neoplasm of unspecified site of left female breast: Secondary | ICD-10-CM

## 2010-10-03 DIAGNOSIS — C50919 Malignant neoplasm of unspecified site of unspecified female breast: Secondary | ICD-10-CM

## 2010-10-03 HISTORY — DX: Malignant neoplasm of unspecified site of left female breast: C50.912

## 2010-10-03 NOTE — Telephone Encounter (Signed)
Patient instructed to start Calcium 600 mg and Vitamin D 1,000 - 2,000 units daily.  Bone Density will need to be done in 2 years.  Transcribed order placed for Bone Density in 09/2012.

## 2010-10-06 ENCOUNTER — Other Ambulatory Visit (HOSPITAL_COMMUNITY): Payer: Self-pay | Admitting: *Deleted

## 2010-10-06 MED ORDER — AMLODIPINE BESYLATE 5 MG PO TABS: 5.0000 mg | ORAL_TABLET | Freq: Every day | ORAL | Status: DC

## 2010-10-09 ENCOUNTER — Encounter (HOSPITAL_COMMUNITY): Payer: Self-pay | Admitting: *Deleted

## 2010-10-09 ENCOUNTER — Inpatient Hospital Stay (HOSPITAL_COMMUNITY): Admission: RE | Admit: 2010-10-09 | Payer: Medicare Other | Source: Ambulatory Visit

## 2010-10-14 ENCOUNTER — Encounter (HOSPITAL_COMMUNITY)
Admission: RE | Disposition: A | Discharge status: Home / Self Care | Payer: Self-pay | Source: Ambulatory Visit | Attending: Ophthalmology

## 2010-10-14 ENCOUNTER — Encounter (HOSPITAL_COMMUNITY): Payer: Self-pay | Admitting: Ophthalmology

## 2010-10-14 ENCOUNTER — Encounter (HOSPITAL_COMMUNITY): Payer: Self-pay | Admitting: Anesthesiology

## 2010-10-14 ENCOUNTER — Ambulatory Visit (HOSPITAL_COMMUNITY)
Admission: RE | Admit: 2010-10-14 | Discharge: 2010-10-14 | Disposition: A | Discharge status: Home / Self Care | Payer: Medicare Other | Source: Ambulatory Visit | Attending: Ophthalmology | Admitting: Ophthalmology

## 2010-10-14 ENCOUNTER — Ambulatory Visit (HOSPITAL_COMMUNITY): Payer: Medicare Other | Admitting: Anesthesiology

## 2010-10-14 DIAGNOSIS — I10 Essential (primary) hypertension: Secondary | ICD-10-CM | POA: Insufficient documentation

## 2010-10-14 DIAGNOSIS — Z01812 Encounter for preprocedural laboratory examination: Secondary | ICD-10-CM | POA: Insufficient documentation

## 2010-10-14 DIAGNOSIS — Z7982 Long term (current) use of aspirin: Secondary | ICD-10-CM | POA: Insufficient documentation

## 2010-10-14 DIAGNOSIS — H2510 Age-related nuclear cataract, unspecified eye: Secondary | ICD-10-CM | POA: Insufficient documentation

## 2010-10-14 HISTORY — DX: Depression, unspecified: F32.A

## 2010-10-14 HISTORY — DX: Major depressive disorder, single episode, unspecified: F32.9

## 2010-10-14 HISTORY — PX: CATARACT EXTRACTION W/PHACO: SHX586

## 2010-10-14 HISTORY — DX: Hyperlipidemia, unspecified: E78.5

## 2010-10-14 HISTORY — DX: Type 2 diabetes mellitus without complications: E11.9

## 2010-10-14 HISTORY — DX: Essential (primary) hypertension: I10

## 2010-10-14 HISTORY — DX: Anxiety disorder, unspecified: F41.9

## 2010-10-14 LAB — GLUCOSE, CAPILLARY: Glucose-Capillary: 110 mg/dL — ABNORMAL HIGH (ref 70–99)

## 2010-10-14 SURGERY — PHACOEMULSIFICATION, CATARACT, WITH IOL INSERTION
Anesthesia: Monitor Anesthesia Care | Site: Eye | Laterality: Left | Wound class: Clean

## 2010-10-14 MED ORDER — TETRACAINE HCL 0.5 % OP SOLN
1.0000 [drp] | OPHTHALMIC | Status: AC
  Administered 2010-10-14 (×3): 1 [drp] via OPHTHALMIC

## 2010-10-14 MED ORDER — TETRACAINE HCL 0.5 % OP SOLN
OPHTHALMIC | Status: AC
  Administered 2010-10-14: 1 [drp] via OPHTHALMIC
  Filled 2010-10-14: qty 2

## 2010-10-14 MED ORDER — MIDAZOLAM HCL 2 MG/2ML IJ SOLN
INTRAMUSCULAR | Status: AC
  Administered 2010-10-14: 2 mg via INTRAVENOUS
  Filled 2010-10-14: qty 2

## 2010-10-14 MED ORDER — CYCLOPENTOLATE-PHENYLEPHRINE 0.2-1 % OP SOLN
OPHTHALMIC | Status: AC
  Administered 2010-10-14: 1 [drp] via OPHTHALMIC
  Filled 2010-10-14: qty 2

## 2010-10-14 MED ORDER — LIDOCAINE HCL 3.5 % OP GEL: 1.0000 "application " | Freq: Once | OPHTHALMIC | Status: DC

## 2010-10-14 MED ORDER — BSS IO SOLN
INTRAOCULAR | Status: DC | PRN
  Administered 2010-10-14: 15 mL via OPHTHALMIC

## 2010-10-14 MED ORDER — LACTATED RINGERS IV SOLN
INTRAVENOUS | Status: DC
  Administered 2010-10-14: 08:00:00 via INTRAVENOUS

## 2010-10-14 MED ORDER — PHENYLEPHRINE HCL 2.5 % OP SOLN
1.0000 [drp] | OPHTHALMIC | Status: AC
  Administered 2010-10-14 (×3): 1 [drp] via OPHTHALMIC

## 2010-10-14 MED ORDER — FLURBIPROFEN SODIUM 0.03 % OP SOLN
OPHTHALMIC | Status: AC
  Administered 2010-10-14: 08:00:00
  Filled 2010-10-14: qty 2.5

## 2010-10-14 MED ORDER — PROVISC 10 MG/ML IO SOLN
INTRAOCULAR | Status: DC | PRN
  Administered 2010-10-14: .85 mL via INTRAOCULAR

## 2010-10-14 MED ORDER — CYCLOPENTOLATE-PHENYLEPHRINE 0.2-1 % OP SOLN
1.0000 [drp] | OPHTHALMIC | Status: AC
  Administered 2010-10-14 (×3): 1 [drp] via OPHTHALMIC

## 2010-10-14 MED ORDER — EPINEPHRINE HCL 1 MG/ML IJ SOLN
INTRAOCULAR | Status: DC | PRN
  Administered 2010-10-14: 08:00:00

## 2010-10-14 MED ORDER — KETOROLAC TROMETHAMINE 0.5 % OP SOLN
1.0000 [drp] | OPHTHALMIC | Status: AC
  Administered 2010-10-14 (×3): 1 [drp] via OPHTHALMIC

## 2010-10-14 MED ORDER — PHENYLEPHRINE HCL 2.5 % OP SOLN
OPHTHALMIC | Status: AC
  Administered 2010-10-14: 1 [drp] via OPHTHALMIC
  Filled 2010-10-14: qty 2

## 2010-10-14 MED ORDER — EPINEPHRINE HCL 1 MG/ML IJ SOLN
INTRAMUSCULAR | Status: AC
  Filled 2010-10-14: qty 1

## 2010-10-14 MED ORDER — MIDAZOLAM HCL 2 MG/2ML IJ SOLN
1.0000 mg | INTRAMUSCULAR | Status: DC | PRN
  Administered 2010-10-14: 2 mg via INTRAVENOUS

## 2010-10-14 SURGICAL SUPPLY — 24 items
CAPSULAR TENSION RING-AMO (OPHTHALMIC RELATED) IMPLANT
CLOTH BEACON ORANGE TIMEOUT ST (SAFETY) ×2 IMPLANT
DUOVISC SYSTEM (INTRAOCULAR LENS)
EYE SHIELD UNIVERSAL CLEAR (GAUZE/BANDAGES/DRESSINGS) ×2 IMPLANT
GLOVE BIO SURGEON STRL SZ 6.5 (GLOVE) ×2 IMPLANT
GLOVE BIOGEL M 6.5 STRL (GLOVE) IMPLANT
GLOVE ECLIPSE 6.5 STRL STRAW (GLOVE) IMPLANT
GLOVE ECLIPSE 7.0 STRL STRAW (GLOVE) IMPLANT
GLOVE EXAM NITRILE LRG STRL (GLOVE) IMPLANT
GLOVE EXAM NITRILE MD LF STRL (GLOVE) ×2 IMPLANT
GLOVE SKINSENSE NS SZ6.5 (GLOVE)
GLOVE SKINSENSE STRL SZ6.5 (GLOVE) IMPLANT
HEALON 5 0.6 ML (INTRAOCULAR LENS) IMPLANT
KIT VITRECTOMY (OPHTHALMIC RELATED) IMPLANT
PAD ARMBOARD 7.5X6 YLW CONV (MISCELLANEOUS) ×2 IMPLANT
PROC W NO LENS (INTRAOCULAR LENS)
PROC W SPEC LENS (INTRAOCULAR LENS)
PROCESS W NO LENS (INTRAOCULAR LENS) IMPLANT
PROCESS W SPEC LENS (INTRAOCULAR LENS) IMPLANT
RING MALYGIN (MISCELLANEOUS) IMPLANT
SIGHTPATH CAT PROC W REG LENS (Ophthalmic Related) ×2 IMPLANT
SYSTEM DUOVISC (INTRAOCULAR LENS) IMPLANT
VISCOELASTIC ADDITIONAL (OPHTHALMIC RELATED) IMPLANT
WATER STERILE IRR 250ML POUR (IV SOLUTION) ×2 IMPLANT

## 2010-10-14 NOTE — Anesthesia Preprocedure Evaluation (Signed)
Anesthesia Evaluation   Patient awake  General Assessment Comment  Reviewed: Allergy & Precautions, H&P , Patient's Chart, lab work & pertinent test results and reviewed documented beta blocker date and time   History of Anesthesia Complications Negative for: history of anesthetic complications  Airway Mallampati: II  Neck ROM: Full    Dental  (+) Teeth Intact   Pulmonary    pulmonary exam normal   Cardiovascular hypertension, Pt. on medications    Neuro/Psych (+) {AN ROS/MED HX NEURO HEADACHES (+) Anxiety, Depression,   GI/Hepatic/Renal   Endo/Other   (+) Diabetes mellitus-, Well Controlled, Type 2  Abdominal   Musculoskeletal  Hematology   Peds  Reproductive/Obstetrics   Anesthesia Other Findings             Anesthesia Physical Anesthesia Plan  ASA: II  Anesthesia Plan: MAC   Post-op Pain Management:    Induction:   Airway Management Planned: Nasal Cannula  Additional Equipment:   Intra-op Plan:   Post-operative Plan:   Informed Consent: I have reviewed the patients History and Physical, chart, labs and discussed the procedure including the risks, benefits and alternatives for the proposed anesthesia with the patient or authorized representative who has indicated his/her understanding and acceptance.     Plan Discussed with:   Anesthesia Plan Comments:         Anesthesia Quick Evaluation

## 2010-10-14 NOTE — Transfer of Care (Signed)
Immediate Anesthesia Transfer of Care Note  Patient: Stacy [name]  Procedure(s) Performed:  CATARACT EXTRACTION PHACO AND INTRAOCULAR LENS PLACEMENT (IOC) - CDE:  8.79  shortstay  Anesthesia Type: MAC  Level of Consciousness: awake and alert   Airway & Oxygen Therapy: Patient Spontanous Breathing  Post-op Assessment: Report given to PACU RN, Post -op Vital signs reviewed and stable and Patient moving all extremities  Post vital signs: Reviewed and stable  Complications: No apparent anesthesia complications

## 2010-10-14 NOTE — H&P (Signed)
No change in H and P from office. 

## 2010-10-14 NOTE — Anesthesia Postprocedure Evaluation (Signed)
  Anesthesia Post-op Note  Patient: Stacy [name]  Procedure(s) Performed:  CATARACT EXTRACTION PHACO AND INTRAOCULAR LENS PLACEMENT (IOC) - CDE:  8.79  Patient Location: Short Stay  Anesthesia Type: MAC  Level of Consciousness: awake and alert   Airway and Oxygen Therapy: Patient Spontanous Breathing  Post-op Pain: none  Post-op Assessment: Post-op Vital signs reviewed, Patient's Cardiovascular Status Stable, Respiratory Function Stable, Patent Airway and No signs of Nausea or vomiting  Post-op Vital Signs: Reviewed and stable  Complications: No apparent anesthesia complications

## 2010-10-14 NOTE — Op Note (Signed)
Patient brought to the operating room and prepped and draped in the usual manner.  Lid speculum inserted in right eye.  Stab incision made at the twelve o'clock position.  Provisc instilled in the anterior chamber.   A 2.4 mm. Stab incision was made temporally.  An anterior capsulotomy was done with a bent 25 gauge needle.  The nucleus was hydrodissected.  The Phaco tip was inserted in the anterior chamber and the nucleus was emulsified.  CDE was 8.79.  The cortical material was then removed with the I and A tip.  Posterior capsule was the polished.  The anterior chamber was deepened with Provisc.  A 24.0 Diopter Rayner 570C IOL was then inserted in the capsular bag.  Provisc was then removed with the I and A tip.  The wound was then hydrated.  Patient sent to the Recovery Room in good condition with follow up in my office.

## 2010-10-14 NOTE — Anesthesia Procedure Notes (Signed)
Procedure Name: MAC Date/Time: 10/14/2010 8:09 AM Performed by: Minerva Areola Pre-anesthesia Checklist: Patient identified, Patient being monitored, Emergency Drugs available, Timeout performed and Suction available Patient Re-evaluated:Patient Re-evaluated prior to inductionOxygen Delivery Method: Nasal Cannula

## 2010-10-27 ENCOUNTER — Encounter (HOSPITAL_COMMUNITY): Payer: Self-pay | Admitting: Ophthalmology

## 2010-11-04 ENCOUNTER — Encounter (HOSPITAL_COMMUNITY): Payer: Medicare Other | Attending: Oncology | Admitting: Oncology

## 2010-11-04 DIAGNOSIS — Z170 Estrogen receptor positive status [ER+]: Secondary | ICD-10-CM

## 2010-11-04 DIAGNOSIS — I10 Essential (primary) hypertension: Secondary | ICD-10-CM | POA: Insufficient documentation

## 2010-11-04 DIAGNOSIS — C50919 Malignant neoplasm of unspecified site of unspecified female breast: Secondary | ICD-10-CM | POA: Insufficient documentation

## 2010-11-04 NOTE — Patient Instructions (Signed)
[facility]  Discharge Instructions  RECOMMENDATIONS MADE BY THE CONSULTANT AND ANY TEST RESULTS WILL BE SENT TO YOUR REFERRING DOCTOR.        SPECIAL INSTRUCTIONS/FOLLOW-UP: You are doing well. We will get appointment scheduled in 8-9 weeks with Dr.Wentworth for radiation and Dr.Zeigler for port-a-cath placement.   I acknowledge that I have been informed and understand all the instructions given to me and received a copy. I do not have any more questions at this time, but understand that I may call the Specialty Clinic at [facility] at [phone] during business hours should I have any further questions or need assistance in obtaining follow-up care.    __________________________________________  _____________  __________ Signature of Patient or Authorized Representative            Date                   Time    __________________________________________ Nurse's Signature

## 2010-11-04 NOTE — Progress Notes (Signed)
This office note has been dictated.

## 2010-11-04 NOTE — Progress Notes (Signed)
CC:   Tilford Pillar, MD Lurline Hare, M.D.  DIAGNOSIS:  Stage III carcinoma of the left breast presented with a large mass in the breast, retraction of the nipple, and lymph node suspicious in left axilla by CT scan.  Estrogen receptor is 100%, progesterone receptor is 100%, KI-67 marker is 66%.  LVI was seen but was felt to be a grade 1 cancer and HER2 was said to be negative.  She had her biopsy on June 26th.  The patient is here today with her husband and daughter.  She is feeling very good today.  Vital signs are all stable.  Her oncologic review of systems is really negative.  She cannot tell she is taking the pill.  She takes it and has no side effects whatsoever.  Her sense of well-being is excellent.  PHYSICAL EXAMINATION:  Vital Signs:  Her weight is stable.  She is afebrile.  Blood pressure is 124/63, pulse is right around 55 and regular, respirations 16-18 and unlabored.  She is in no acute distress. She is alert and oriented.  She does have some left eye pupillary changes where she had her cataract removed recently.  She still cannot read but she can see better.  Neck:  Supple.  Lungs:  Clear to auscultation and percussion.  Lymph:  She has no axillary adenopathy. No supraclavicular, no infraclavicular, no cervical or inguinal nodes. Abdomen:  She has no hepatosplenomegaly.  Breasts:  Her left breast shows that the mass has been reduced to about 4 x 4 cm maximum diameter. The retraction of the nipple is still present but there is no breakdown of skin.  The whole breast is much softer.  It has clearly shrunk in size.  Heart:  No murmur, rub, or gallop.  At this time, there is no arm edema and no leg edema.  She looks really quite good so we are going to continue the Femara for another 2-1/2 months and then line up consultations with Dr. Leticia Penna in anticipation of surgery and Dr. Michell Heinrich to make sure she sees her before surgical resection.  In the meantime, we  will actually continue the Femara.  I do not think I need to do any blood work today.  She and her family delighted with the response as am I.  I will let her see Dr. Leticia Penna before scheduling any x-rays prior to surgery.  I will let him make that determination as to what he thinks he needs.  In the meantime, she will continue the Femara.  I will see her back in 8 week's myself.  She will see Drs. Michell Heinrich and [place] in the next 8-9 weeks and we will go from there.   ______________________________ Ladona Horns. Mariel Sleet, MD ESN/MEDQ  D:  11/04/2010  T:  11/04/2010  Job:  [id number]

## 2011-01-02 MED ORDER — FENTANYL CITRATE 0.05 MG/ML IJ SOLN: 25.0000 ug | INTRAMUSCULAR | Status: DC | PRN

## 2011-01-02 MED ORDER — ONDANSETRON HCL 4 MG/2ML IJ SOLN: 4.0000 mg | Freq: Once | INTRAMUSCULAR | Status: DC | PRN

## 2011-01-02 NOTE — Patient Instructions (Addendum)
20 Stacy [name]  01/02/2011   Your procedure is scheduled on:  01/09/11  Report to [facility] at 07:00 AM.  Call this number if you have problems the morning of surgery: [phone]   Remember:   Do not eat food:After Midnight.  Do not drink clear liquids: After Midnight.  Take these medicines the morning of surgery with A SIP OF WATER: Pepcis, lexapro, Norvasc, and Zestoretic   Do not wear jewelry, make-up or nail polish.  Do not wear lotions, powders, or perfumes. You may wear deodorant.  Do not shave 48 hours prior to surgery.  Do not bring valuables to the hospital.  Contacts, dentures or bridgework may not be worn into surgery.  Leave suitcase in the car. After surgery it may be brought to your room.  For patients admitted to the hospital, checkout time is 11:00 AM the day of discharge.   Patients discharged the day of surgery will not be allowed to drive home.  Name and phone number of your driver: driver  Special Instructions: CHG Shower Use Special Wash: 1/2 bottle night before surgery and 1/2 bottle morning of surgery.   Please read over the following fact sheets that you were given: Pain Booklet, Coughing and Deep Breathing, MRSA Information, Surgical Site Infection Prevention, Anesthesia Post-op Instructions and Care and Recovery After Surgery   Total or Modified Radical Mastectomy  Care After Refer to this sheet in the next few weeks. These instructions provide you with information on caring for yourself after your procedure. Your caregiver may also give you more specific instructions. Your treatment has been planned according to current medical practices, but problems sometimes occur. Call your caregiver if you have any problems or questions after your procedure. ACTIVITY  Your caregiver will advise you when you may resume strenuous activities, driving, and sports.   After the drain(s) are removed, you may do light housework. Avoid heavy lifting, carrying, or pushing. You  should not be lifting anything heavier than 5 lbs.   Take frequent rest periods. You may tire more easily than usual.   Always rest and elevate the arm affected by your surgery for a period of time equal to your activity time.   Continue doing the exercises given to you by the physical therapist/occupational therapist even after full range of motion has returned. The amount of time this takes will vary from person to person.   After normal range of motion has returned, some stiffness and soreness may persist for 2-3 months. This is normal and will subside.   Begin sports or strenuous activities in moderation. This will give you a chance to rebuild your endurance. Continue to be cautious of heavy lifting or carrying (no more than 10 lbs.) with your affected arm.   You may return to work as recommended by your caregiver.  NUTRITION  You may resume your normal diet.   Make sure you drink plenty of fluids (6-8 glasses a day).   Eat a well-balanced diet. Including daily portions of food from government recommended food groups:   Grains.   Vegetables.   Fruits.   Milk.   Meat & beans.   Oils.  Visit [employer] for more information HYGIENE  You may wash your hair.   If your incision (cut from surgery) is closed, you may shower or tub bathe, unless instructed otherwise by your doctor.  FEVER  If you feel feverish or have shaking chills, take your temperature. If your temperature is 102 F (38.9 C)  or above, call your caregiver. The fever may mean there is an infection.   If you call early, infection can be treated with antibiotics and hospitalization may be avoided.  PAIN CONTROL  Mild discomfort may occur.   You may need to take an over-the-counter pain medication or a medication prescribed by your caregiver.   Call your caregiver if you experience increased pain.  INCISION CARE  Check your incision daily for increased redness, drainage, swelling, or  separation of skin.   Call your caregiver if any of the above are noted.  ARM AND HAND CARE  If the lymph nodes under your arm were removed with a modified radical mastectomy, there may be a greater tendency for the arm to swell.   Try to avoid having blood pressures taken, blood drawn, or injections given in the affected arm. This is the arm on the same side as the surgery.   Use hand lotion to soften cuticles instead of cutting them to avoid cutting yourself.   Be careful when shaving your under arms. Use an electric shaver if possible. You may use a deodorant after the incision has completely healed. Until then, clean under your arms with hydrogen peroxide.   Use reasonable precaution when cooking, sewing, and gardening to avoid burning or needle or thorn pricks.   Do not weigh your arm straight down with a package or your purse.   Follow the exercises and instructions given to you by the physical therapist/occupational therapist and your caregiver.  FOLLOW-UP APPOINTMENT Call your caregiver for a follow-up appointment as directed. PROSTHESIS INFORMATION Wear your temporary prosthesis (artificial breast) until your caregiver gives you permission to purchase a permanent one. This will depend upon your rate of healing. We suggest you also wait until you are physically and emotionally ready to shop for one. The suitability depends on several individual factors. We do not endorse any particular prosthesis, but suggest you try several until you are satisfied with appearance and fit. A list of stores may be obtained from your local [employer] at www.cancer.org or 1-800-ACS-2345 (1-206-598-1616). A permanent prosthesis is medically necessary to restore balance. It is also income tax deductible. Be sure all receipts are marked "surgical". It is not essential to purchase a bra. You may sew a pocket into your regular bra. Note: Remember to take all of your medical insurance information  with you when shopping for your prosthesis. SELECTING A PROSTHESIS FITTER You may want to ask the following questions when selecting a fitter:  What styles and brands of forms are carried in stock?   How long have the forms been on the market and have there been any problems with them?   Why would one form be better than another?   How long should a particular form last?   May I wear the form for a trial period without obligation?   Do the forms require a prosthetic bra? If so, what is the price range? Must I always wear that style?   If alterations to the bra are necessary, can they be done at this location or be sent out?   Will I be charged for alterations?   Will I receive suggestions on how to alter my own wardrobe, if necessary?   Will you special order forms or bras if necessary?   Are fitters always available to meet my needs?   What kinds of garments should be worn for the fitting?   Are lounge wear, swim wear, and accessories  available?   If I have insurance coverage or Medicare, will you suggest ways for processing the paper work?   Do you keep complete records so that mail reordering is possible?   How are warranty claims handled if I have a problem with the form?  Document Released: 10/24/2003 Document Revised: 11/12/2010 Document Reviewed: 06/28/2007 [facility] Patient Information 2012 [place], [place].

## 2011-01-05 ENCOUNTER — Encounter (HOSPITAL_COMMUNITY): Payer: Self-pay

## 2011-01-05 ENCOUNTER — Encounter (HOSPITAL_COMMUNITY)
Admission: RE | Admit: 2011-01-05 | Discharge: 2011-01-05 | Disposition: A | Discharge status: Home / Self Care | Payer: Medicare Other | Source: Ambulatory Visit | Attending: General Surgery | Admitting: General Surgery

## 2011-01-05 ENCOUNTER — Other Ambulatory Visit: Payer: Self-pay

## 2011-01-05 HISTORY — DX: Anemia, unspecified: D64.9

## 2011-01-05 LAB — DIFFERENTIAL
Basophils Absolute: 0 10*3/uL (ref 0.0–0.1)
Lymphocytes Relative: 32 % (ref 12–46)
Lymphs Abs: 2.1 10*3/uL (ref 0.7–4.0)
Monocytes Absolute: 0.6 10*3/uL (ref 0.1–1.0)
Neutro Abs: 3.7 10*3/uL (ref 1.7–7.7)

## 2011-01-05 LAB — CBC
HCT: 33.6 % — ABNORMAL LOW (ref 36.0–46.0)
Hemoglobin: 11.6 g/dL — ABNORMAL LOW (ref 12.0–15.0)
RBC: 3.65 MIL/uL — ABNORMAL LOW (ref 3.87–5.11)
RDW: 13.4 % (ref 11.5–15.5)
WBC: 6.6 10*3/uL (ref 4.0–10.5)

## 2011-01-05 LAB — TYPE AND SCREEN
ABO/RH(D): A NEG
Antibody Screen: NEGATIVE

## 2011-01-05 LAB — BASIC METABOLIC PANEL
CO2: 27 mEq/L (ref 19–32)
Chloride: 102 mEq/L (ref 96–112)
Creatinine, Ser: 1.28 mg/dL — ABNORMAL HIGH (ref 0.50–1.10)
GFR calc Af Amer: 46 mL/min — ABNORMAL LOW (ref >90)
Potassium: 4.2 mEq/L (ref 3.5–5.1)
Sodium: 139 mEq/L (ref 135–145)

## 2011-01-05 LAB — SURGICAL PCR SCREEN: MRSA, PCR: NEGATIVE

## 2011-01-05 NOTE — H&P (Signed)
  NTS SOAP Note  Vital Signs:  Vitals as of: 12/30/2010: Systolic 136: Diastolic 64: Heart Rate 54: Temp 96.74F: Height 63ft 3in: Weight 207Lbs 0 Ounces: Pain Level 0: BMI 37  BMI : 36.67 kg/m2  Subjective: This 53 Years 61 Months old Female presents for of Breast cancer.  Has underwent hormone treatment and has had decrease in the size of mass.  No fevers or chills.  Has not had any radiation tx but is to see rad onc next week.  No breast or skin changes.  Review of Symptoms:  Constitutional:unremarkable Head:unremarkable Eyes:unremarkable Nose/Mouth/Throat:unremarkable Cardiovascular:unremarkable Respiratory:unremarkable Gastrointestinal:unremarkable Genitourinary:unremarkable Musculoskeletal:unremarkable Skin:unremarkable Breast:unremarkable Hematolgic/Lymphatic:unremarkable Allergic/Immunologic:unremarkable   Past Medical History:Reviewed   Past Medical History      Not Available   Social History:Reviewed   Social History  Preferred Language: English ([place]) Race:  White Ethnicity: Not Hispanic / Latino Age: 77 Years 0 Months Marital Status:  M   Smoking Status: Unknown if ever smoked  Family History:Reviewed   Family History      Not Available    Objective Information: General:Well appearing, well nourished in no distress. Skin:no rash or prominent lesions Head:Atraumatic; no masses; no abnormalities Eyes:conjunctiva clear, EOM intact, PERRL Mouth:Mucous membranes moist, no mucosal lesions. Throat:no erythema, exudates or lesions. Neck:Supple without lymphadenopathy.  Lungs:CTA bilaterally, no wheezes, rhonchi, rales.  Breathing unlabored. L breast mass is more mobile.  non-tender.  Nipple retraction.   Abdomen:Soft, NT/ND, no HSM, no masses. Extremities:No deformities, clubbing, cyanosis, or edema.   Assessment:  Diagnosis &amp; Procedure: DiagnosisCode:  174.9, ProcedureCode: 16109,    Plan: L breast cancer.  Discussed surgical options.  Limited to MRM with ln dissection due to involvement of nipple and axillary ln.  Patient Education:Alternative treatments to surgery were discussed with patient (and family).Risks and benefits  of procedure were fully explained to the patient (and family) who gave informed consent. Patient/family questions were addressed.  Follow-up:Pending Surgery

## 2011-01-09 ENCOUNTER — Encounter (HOSPITAL_COMMUNITY): Payer: Self-pay | Admitting: *Deleted

## 2011-01-09 ENCOUNTER — Encounter (HOSPITAL_COMMUNITY): Payer: Self-pay | Admitting: Anesthesiology

## 2011-01-09 ENCOUNTER — Other Ambulatory Visit: Payer: Self-pay | Admitting: General Surgery

## 2011-01-09 ENCOUNTER — Encounter (HOSPITAL_COMMUNITY)
Admission: RE | Disposition: A | Discharge status: Home / Self Care | Payer: Self-pay | Source: Ambulatory Visit | Attending: General Surgery

## 2011-01-09 ENCOUNTER — Inpatient Hospital Stay (HOSPITAL_COMMUNITY): Payer: Medicare Other | Admitting: Anesthesiology

## 2011-01-09 ENCOUNTER — Encounter (HOSPITAL_COMMUNITY): Payer: Self-pay

## 2011-01-09 ENCOUNTER — Inpatient Hospital Stay (HOSPITAL_COMMUNITY)
Admission: RE | Admit: 2011-01-09 | Discharge: 2011-01-11 | DRG: 583 | Disposition: A | Discharge status: Home / Self Care | Payer: Medicare Other | Source: Ambulatory Visit | Attending: General Surgery | Admitting: General Surgery

## 2011-01-09 DIAGNOSIS — C50119 Malignant neoplasm of central portion of unspecified female breast: Principal | ICD-10-CM | POA: Diagnosis present

## 2011-01-09 DIAGNOSIS — I10 Essential (primary) hypertension: Secondary | ICD-10-CM | POA: Diagnosis present

## 2011-01-09 DIAGNOSIS — E119 Type 2 diabetes mellitus without complications: Secondary | ICD-10-CM | POA: Diagnosis present

## 2011-01-09 DIAGNOSIS — E7800 Pure hypercholesterolemia, unspecified: Secondary | ICD-10-CM | POA: Diagnosis present

## 2011-01-09 LAB — CBC
HCT: 31 % — ABNORMAL LOW (ref 36.0–46.0)
Hemoglobin: 10.6 g/dL — ABNORMAL LOW (ref 12.0–15.0)
MCH: 31.4 pg (ref 26.0–34.0)
MCHC: 34.2 g/dL (ref 30.0–36.0)
RBC: 3.38 MIL/uL — ABNORMAL LOW (ref 3.87–5.11)

## 2011-01-09 LAB — CREATININE, SERUM
Creatinine, Ser: 1 mg/dL (ref 0.50–1.10)
GFR calc non Af Amer: 53 mL/min — ABNORMAL LOW (ref >90)

## 2011-01-09 LAB — GLUCOSE, CAPILLARY

## 2011-01-09 SURGERY — MASTECTOMY, MODIFIED RADICAL
Anesthesia: General | Site: Breast | Laterality: Left | Wound class: Clean

## 2011-01-09 MED ORDER — SODIUM CHLORIDE 0.9 % IR SOLN
Status: DC | PRN
  Administered 2011-01-09: 1000 mL

## 2011-01-09 MED ORDER — PROPOFOL 10 MG/ML IV EMUL
INTRAVENOUS | Status: DC | PRN
  Administered 2011-01-09: 150 mg via INTRAVENOUS

## 2011-01-09 MED ORDER — ROCURONIUM BROMIDE 100 MG/10ML IV SOLN
INTRAVENOUS | Status: DC | PRN
  Administered 2011-01-09: 35 mg via INTRAVENOUS
  Administered 2011-01-09: 10 mg via INTRAVENOUS
  Administered 2011-01-09: 5 mg via INTRAVENOUS

## 2011-01-09 MED ORDER — ROCURONIUM BROMIDE 50 MG/5ML IV SOLN
INTRAVENOUS | Status: AC
  Filled 2011-01-09: qty 1

## 2011-01-09 MED ORDER — ACETAMINOPHEN 325 MG PO TABS: 325.0000 mg | ORAL_TABLET | ORAL | Status: DC | PRN

## 2011-01-09 MED ORDER — MIDAZOLAM HCL 2 MG/2ML IJ SOLN
1.0000 mg | INTRAMUSCULAR | Status: DC | PRN
  Administered 2011-01-09: 2 mg via INTRAVENOUS

## 2011-01-09 MED ORDER — POVIDONE-IODINE 10 % EX OINT
TOPICAL_OINTMENT | CUTANEOUS | Status: AC
  Filled 2011-01-09: qty 2

## 2011-01-09 MED ORDER — ONDANSETRON HCL 4 MG/2ML IJ SOLN
4.0000 mg | Freq: Once | INTRAMUSCULAR | Status: AC
  Administered 2011-01-09: 4 mg via INTRAVENOUS

## 2011-01-09 MED ORDER — ENOXAPARIN SODIUM 40 MG/0.4ML ~~LOC~~ SOLN
SUBCUTANEOUS | Status: AC
  Filled 2011-01-09: qty 0.4

## 2011-01-09 MED ORDER — LISINOPRIL 10 MG PO TABS
20.0000 mg | ORAL_TABLET | Freq: Every day | ORAL | Status: DC
  Administered 2011-01-10 – 2011-01-11 (×2): 20 mg via ORAL
  Filled 2011-01-09 (×2): qty 2

## 2011-01-09 MED ORDER — CEFAZOLIN SODIUM 1-5 GM-% IV SOLN: 1.0000 g | INTRAVENOUS | Status: DC

## 2011-01-09 MED ORDER — FENTANYL CITRATE 0.05 MG/ML IJ SOLN
INTRAMUSCULAR | Status: AC
  Administered 2011-01-09: 50 ug via INTRAVENOUS
  Filled 2011-01-09: qty 5

## 2011-01-09 MED ORDER — PROMETHAZINE HCL 25 MG/ML IJ SOLN: 12.5000 mg | Freq: Four times a day (QID) | INTRAMUSCULAR | Status: DC | PRN

## 2011-01-09 MED ORDER — MIDAZOLAM HCL 5 MG/5ML IJ SOLN
INTRAMUSCULAR | Status: DC | PRN
  Administered 2011-01-09: 2 mg via INTRAVENOUS

## 2011-01-09 MED ORDER — ONDANSETRON HCL 4 MG/2ML IJ SOLN: 4.0000 mg | Freq: Once | INTRAMUSCULAR | Status: DC | PRN

## 2011-01-09 MED ORDER — ROSUVASTATIN CALCIUM 5 MG PO TABS
5.0000 mg | ORAL_TABLET | Freq: Every day | ORAL | Status: DC
  Administered 2011-01-10 – 2011-01-11 (×2): 5 mg via ORAL
  Filled 2011-01-09 (×2): qty 1

## 2011-01-09 MED ORDER — FENTANYL CITRATE 0.05 MG/ML IJ SOLN
INTRAMUSCULAR | Status: DC | PRN
  Administered 2011-01-09 (×5): 50 ug via INTRAVENOUS

## 2011-01-09 MED ORDER — LACTATED RINGERS IV SOLN
INTRAVENOUS | Status: DC
  Administered 2011-01-09 (×2): via INTRAVENOUS

## 2011-01-09 MED ORDER — LISINOPRIL-HYDROCHLOROTHIAZIDE 20-25 MG PO TABS: 1.0000 | ORAL_TABLET | Freq: Every day | ORAL | Status: DC

## 2011-01-09 MED ORDER — GLYCOPYRROLATE 0.2 MG/ML IJ SOLN
0.2000 mg | Freq: Once | INTRAMUSCULAR | Status: AC | PRN
  Administered 2011-01-09: 08:00:00 via INTRAVENOUS

## 2011-01-09 MED ORDER — MIDAZOLAM HCL 2 MG/2ML IJ SOLN
INTRAMUSCULAR | Status: AC
  Filled 2011-01-09: qty 2

## 2011-01-09 MED ORDER — LIDOCAINE HCL 1 % IJ SOLN
INTRAMUSCULAR | Status: DC | PRN
  Administered 2011-01-09: 30 mg via INTRADERMAL

## 2011-01-09 MED ORDER — LETROZOLE 2.5 MG PO TABS
2.5000 mg | ORAL_TABLET | Freq: Every day | ORAL | Status: DC
  Administered 2011-01-09 – 2011-01-11 (×3): 2.5 mg via ORAL
  Filled 2011-01-09 (×4): qty 1

## 2011-01-09 MED ORDER — ENOXAPARIN SODIUM 40 MG/0.4ML ~~LOC~~ SOLN
40.0000 mg | SUBCUTANEOUS | Status: DC
  Administered 2011-01-10: 40 mg via SUBCUTANEOUS
  Filled 2011-01-09: qty 0.4

## 2011-01-09 MED ORDER — VITAMIN D3 25 MCG (1000 UNIT) PO TABS
1000.0000 [IU] | ORAL_TABLET | Freq: Every day | ORAL | Status: DC
  Administered 2011-01-09 – 2011-01-11 (×3): 1000 [IU] via ORAL
  Filled 2011-01-09 (×4): qty 1

## 2011-01-09 MED ORDER — CEFAZOLIN SODIUM 1-5 GM-% IV SOLN
INTRAVENOUS | Status: DC | PRN
  Administered 2011-01-09: 1 g via INTRAVENOUS

## 2011-01-09 MED ORDER — HYDROMORPHONE HCL 1 MG/ML IJ SOLN
1.0000 mg | INTRAMUSCULAR | Status: DC | PRN
  Administered 2011-01-09: 1 mg via INTRAVENOUS
  Filled 2011-01-09: qty 1

## 2011-01-09 MED ORDER — ONDANSETRON HCL 4 MG/2ML IJ SOLN: 4.0000 mg | Freq: Four times a day (QID) | INTRAMUSCULAR | Status: DC | PRN

## 2011-01-09 MED ORDER — HYDROCODONE-ACETAMINOPHEN 5-325 MG PO TABS: 1.0000 | ORAL_TABLET | ORAL | Status: DC | PRN

## 2011-01-09 MED ORDER — NEOSTIGMINE METHYLSULFATE 1 MG/ML IJ SOLN
INTRAMUSCULAR | Status: DC | PRN
  Administered 2011-01-09: 3 mg via INTRAVENOUS

## 2011-01-09 MED ORDER — ONDANSETRON HCL 4 MG/2ML IJ SOLN
INTRAMUSCULAR | Status: AC
  Filled 2011-01-09: qty 2

## 2011-01-09 MED ORDER — HYDROCHLOROTHIAZIDE 25 MG PO TABS
25.0000 mg | ORAL_TABLET | Freq: Every day | ORAL | Status: DC
  Administered 2011-01-10 – 2011-01-11 (×2): 25 mg via ORAL
  Filled 2011-01-09 (×2): qty 1

## 2011-01-09 MED ORDER — CEFAZOLIN SODIUM 1-5 GM-% IV SOLN
INTRAVENOUS | Status: AC
  Filled 2011-01-09: qty 50

## 2011-01-09 MED ORDER — LACTATED RINGERS IV SOLN
INTRAVENOUS | Status: DC
  Administered 2011-01-09 – 2011-01-10 (×2): via INTRAVENOUS

## 2011-01-09 MED ORDER — GLYCOPYRROLATE 0.2 MG/ML IJ SOLN
INTRAMUSCULAR | Status: AC
  Filled 2011-01-09: qty 1

## 2011-01-09 MED ORDER — FAMOTIDINE 20 MG PO TABS
20.0000 mg | ORAL_TABLET | Freq: Every day | ORAL | Status: DC
  Administered 2011-01-10 – 2011-01-11 (×2): 20 mg via ORAL
  Filled 2011-01-09 (×2): qty 1

## 2011-01-09 MED ORDER — AMLODIPINE BESYLATE 5 MG PO TABS
5.0000 mg | ORAL_TABLET | Freq: Every day | ORAL | Status: DC
  Administered 2011-01-10 – 2011-01-11 (×2): 5 mg via ORAL
  Filled 2011-01-09 (×2): qty 1

## 2011-01-09 MED ORDER — CALCIUM CARBONATE-VITAMIN D 500-200 MG-UNIT PO TABS
1.0000 | ORAL_TABLET | Freq: Every day | ORAL | Status: DC
  Administered 2011-01-09 – 2011-01-11 (×3): 1 via ORAL
  Filled 2011-01-09 (×3): qty 1

## 2011-01-09 MED ORDER — GLYCOPYRROLATE 0.2 MG/ML IJ SOLN
INTRAMUSCULAR | Status: DC | PRN
  Administered 2011-01-09 (×2): .4 mg via INTRAVENOUS

## 2011-01-09 MED ORDER — PROPOFOL 10 MG/ML IV EMUL
INTRAVENOUS | Status: AC
  Filled 2011-01-09: qty 20

## 2011-01-09 MED ORDER — ESCITALOPRAM OXALATE 10 MG PO TABS
10.0000 mg | ORAL_TABLET | Freq: Every day | ORAL | Status: DC
  Administered 2011-01-10 – 2011-01-11 (×2): 10 mg via ORAL
  Filled 2011-01-09 (×2): qty 1

## 2011-01-09 MED ORDER — ENOXAPARIN SODIUM 40 MG/0.4ML ~~LOC~~ SOLN
40.0000 mg | Freq: Once | SUBCUTANEOUS | Status: AC
  Administered 2011-01-09: 40 mg via SUBCUTANEOUS

## 2011-01-09 MED ORDER — POVIDONE-IODINE 10 % EX OINT
TOPICAL_OINTMENT | CUTANEOUS | Status: DC | PRN
  Administered 2011-01-09: 1 via TOPICAL

## 2011-01-09 MED ORDER — FENTANYL CITRATE 0.05 MG/ML IJ SOLN
25.0000 ug | INTRAMUSCULAR | Status: DC | PRN
  Administered 2011-01-09 (×3): 50 ug via INTRAVENOUS

## 2011-01-09 MED ORDER — LIDOCAINE HCL (PF) 1 % IJ SOLN
INTRAMUSCULAR | Status: AC
  Filled 2011-01-09: qty 5

## 2011-01-09 MED ORDER — FENTANYL CITRATE 0.05 MG/ML IJ SOLN
INTRAMUSCULAR | Status: AC
  Administered 2011-01-09: 50 ug via INTRAVENOUS
  Filled 2011-01-09: qty 2

## 2011-01-09 SURGICAL SUPPLY — 47 items
APPLIER CLIP 11 MED OPEN (CLIP) ×2
APPLIER CLIP 9.375 SM OPEN (CLIP)
BAG HAMPER (MISCELLANEOUS) ×2 IMPLANT
BLADE HEX COATED 2.75 (ELECTRODE) ×2 IMPLANT
BNDG COHESIVE 4X5 TAN NS LF (GAUZE/BANDAGES/DRESSINGS) ×2 IMPLANT
BNDG CONFORM 6X.82 1P STRL (GAUZE/BANDAGES/DRESSINGS) ×2 IMPLANT
CLIP APPLIE 11 MED OPEN (CLIP) ×1 IMPLANT
CLIP APPLIE 9.375 SM OPEN (CLIP) IMPLANT
CLOTH BEACON ORANGE TIMEOUT ST (SAFETY) ×2 IMPLANT
COVER LIGHT HANDLE STERIS (MISCELLANEOUS) ×4 IMPLANT
DRAPE PROXIMA HALF (DRAPES) ×2 IMPLANT
DURAPREP 6ML APPLICATOR 50/CS (WOUND CARE) ×2 IMPLANT
ELECT REM PT RETURN 9FT ADLT (ELECTROSURGICAL) ×2
ELECTRODE REM PT RTRN 9FT ADLT (ELECTROSURGICAL) ×1 IMPLANT
EVACUATOR DRAINAGE 10X20 100CC (DRAIN) ×2 IMPLANT
EVACUATOR SILICONE 100CC (DRAIN) ×2
GLOVE BIOGEL PI IND STRL 7.0 (GLOVE) ×1 IMPLANT
GLOVE BIOGEL PI IND STRL 7.5 (GLOVE) ×1 IMPLANT
GLOVE BIOGEL PI INDICATOR 7.0 (GLOVE) ×1
GLOVE BIOGEL PI INDICATOR 7.5 (GLOVE) ×1
GLOVE ECLIPSE 6.5 STRL STRAW (GLOVE) ×2 IMPLANT
GLOVE ECLIPSE 7.0 STRL STRAW (GLOVE) ×2 IMPLANT
GLOVE EXAM NITRILE MD LF STRL (GLOVE) ×2 IMPLANT
GLOVE OPTIFIT SS 6.5 STRL BRWN (GLOVE) ×2 IMPLANT
GOWN BRE IMP SLV AUR XL STRL (GOWN DISPOSABLE) ×6 IMPLANT
INST SET MINOR GENERAL (KITS) ×2 IMPLANT
KIT ROOM TURNOVER APOR (KITS) ×2 IMPLANT
MANIFOLD NEPTUNE II (INSTRUMENTS) ×2 IMPLANT
NS IRRIG 1000ML POUR BTL (IV SOLUTION) ×2 IMPLANT
PACK MINOR (CUSTOM PROCEDURE TRAY) ×2 IMPLANT
PAD ARMBOARD 7.5X6 YLW CONV (MISCELLANEOUS) ×2 IMPLANT
SET BASIN LINEN APH (SET/KITS/TRAYS/PACK) ×2 IMPLANT
SPONGE DRAIN TRACH 4X4 STRL 2S (GAUZE/BANDAGES/DRESSINGS) ×2 IMPLANT
SPONGE GAUZE 4X4 12PLY (GAUZE/BANDAGES/DRESSINGS) ×2 IMPLANT
SPONGE LAP 18X18 X RAY DECT (DISPOSABLE) ×4 IMPLANT
STAPLER VISISTAT (STAPLE) ×2 IMPLANT
STOCKINETTE IMPERVIOUS LG (DRAPES) ×2 IMPLANT
SUT ETHILON 3 0 FSL (SUTURE) ×2 IMPLANT
SUT SILK 2 0 (SUTURE) ×1
SUT SILK 2 0 SH (SUTURE) IMPLANT
SUT SILK 2-0 18XBRD TIE 12 (SUTURE) ×1 IMPLANT
SUT VIC AB 3-0 SH 27 (SUTURE) ×1
SUT VIC AB 3-0 SH 27X BRD (SUTURE) ×1 IMPLANT
SUT VICRYL AB 2 0 TIES (SUTURE) IMPLANT
SYR BULB IRRIGATION 50ML (SYRINGE) ×2 IMPLANT
TAPE CLOTH SURG 4X10 WHT LF (GAUZE/BANDAGES/DRESSINGS) ×2 IMPLANT
TOWEL OR 17X26 4PK STRL BLUE (TOWEL DISPOSABLE) ×2 IMPLANT

## 2011-01-09 NOTE — Interval H&P Note (Signed)
History and Physical Interval Note:   01/09/2011   7:58 AM   Stacy [name]  has presented today for surgery, with the diagnosis of Malignant neoplasm of breast (female), unspecified site [174.9]  The various methods of treatment have been discussed with the patient and family. After consideration of risks, benefits and other options for treatment, the patient has consented to  Procedure(s): MASTECTOMY MODIFIED RADICAL as a surgical intervention .  The patients' history has been reviewed, patient examined, no change in status, stable for surgery.  I have reviewed the patients' chart and labs.  Questions were answered to the patient's satisfaction.     Fabio Bering  MD

## 2011-01-09 NOTE — Anesthesia Procedure Notes (Signed)
Procedure Name: Intubation Date/Time: 01/09/2011 9:25 AM Performed by: Despina Hidden Pre-anesthesia Checklist: Patient identified, Timeout performed, Emergency Drugs available, Suction available and Patient being monitored Patient Re-evaluated:Patient Re-evaluated prior to inductionOxygen Delivery Method: Circle System Utilized Preoxygenation: Pre-oxygenation with 100% oxygen Intubation Type: IV induction Ventilation: Mask ventilation without difficulty Laryngoscope Size: 3 and Mac Grade View: Grade II Tube type: Oral Number of attempts: 1 Airway Equipment and Method: stylet Placement Confirmation: ETT inserted through vocal cords under direct vision,  positive ETCO2 and breath sounds checked- equal and bilateral Secured at: 22 cm Tube secured with: Tape Dental Injury: Teeth and Oropharynx as per pre-operative assessment

## 2011-01-09 NOTE — Anesthesia Postprocedure Evaluation (Signed)
  Anesthesia Post-op Note  Patient: Stacy [name]  Procedure(s) Performed:  MASTECTOMY MODIFIED RADICAL  Patient Location: PACU  Anesthesia Type: General  Level of Consciousness: awake, alert , oriented and patient cooperative  Airway and Oxygen Therapy: Patient Spontanous Breathing  Post-op Pain: 5 /10, moderate  Post-op Assessment: Post-op Vital signs reviewed, Patient's Cardiovascular Status Stable, Respiratory Function Stable, Patent Airway and No signs of Nausea or vomiting  Post-op Vital Signs: Reviewed and stable  Complications: No apparent anesthesia complications

## 2011-01-09 NOTE — Anesthesia Preprocedure Evaluation (Signed)
Anesthesia Evaluation  Patient identified by MRN, date of birth, ID band Patient awake  General Assessment Comment  Reviewed: Allergy & Precautions, H&P , NPO status , Patient's Chart, lab work & pertinent test results  History of Anesthesia Complications Negative for: history of anesthetic complications  Airway Mallampati: II TM Distance: >3 FB Neck ROM: Full    Dental No notable dental hx.    Pulmonary    Pulmonary exam normal       Cardiovascular hypertension, Pt. on medications Regular Normal    Neuro/Psych PSYCHIATRIC DISORDERS Anxiety Depression Negative Neurological ROS     GI/Hepatic negative GI ROS Neg liver ROS    Endo/Other  Diabetes mellitus-, Type 2, Oral Hypoglycemic Agents  Renal/GU negative Renal ROS     Musculoskeletal negative musculoskeletal ROS (+)   Abdominal (+) obese,  Abdomen: soft.    Peds  Hematology  (+) Blood dyscrasia, anemia ,   Anesthesia Other Findings   Reproductive/Obstetrics negative OB ROS                           Anesthesia Physical Anesthesia Plan  ASA: III  Anesthesia Plan: General   Post-op Pain Management:    Induction: Intravenous  Airway Management Planned: Oral ETT  Additional Equipment:   Intra-op Plan:   Post-operative Plan: Extubation in OR  Informed Consent: I have reviewed the patients History and Physical, chart, labs and discussed the procedure including the risks, benefits and alternatives for the proposed anesthesia with the patient or authorized representative who has indicated his/her understanding and acceptance.     Plan Discussed with: CRNA  Anesthesia Plan Comments:         Anesthesia Quick Evaluation

## 2011-01-09 NOTE — Transfer of Care (Signed)
Immediate Anesthesia Transfer of Care Note  Patient: Stacy [name]  Procedure(s) Performed:  MASTECTOMY MODIFIED RADICAL  Patient Location: PACU  Anesthesia Type: General  Level of Consciousness: awake, oriented and patient cooperative  Airway & Oxygen Therapy: Patient Spontanous Breathing and Patient connected to face mask oxygen  Post-op Assessment: Report given to PACU RN, Post -op Vital signs reviewed and stable and Patient moving all extremities X 4  Post vital signs: Reviewed and stable  Complications: No apparent anesthesia complications

## 2011-01-09 NOTE — Progress Notes (Signed)
UR chart review completed.  

## 2011-01-09 NOTE — Op Note (Signed)
Patient:  Stacy [name]  DOB:  Apr 03, 1931  MRN:  [id number]   Preop Diagnosis:  Left sided Invasive Ductal Carcinoma  Postop Diagnosis:  The same  Procedure:  Left modified radical mastectomy and drain placement  Surgeon:  Dr. Tilford Pillar  Anes:  General endotracheal  Indications:  Patient is a 77 year old female known to me with a history of invasive ductal carcinoma. She underwent neoadjuvant treatment and with reduction in the size of the tumor was scheduled for surgical intervention. As the tumor does involve the nipple areolar complex a modified radical mastectomy was discussed with the patient. Risks benefits and alternatives were discussed at length including but not limited to risk of bleeding, infection, skin dehiscence, skin flap necrosis, intraoperative cardiac and pulmonary events. Patient's questions and concerns were addressed the patient was consented for the planned procedure.  Procedure note:  Patient is taken to the OR is placed in a supine position on the or table. At this point the general anesthetic is administered and was patient was asleep she is in endotracheally intubated by the nurse anesthetist. At this point her left chest wall and arm are prepped with DuraPrep solution and draped in standard fashion. An elliptical incision was created around the nipple area or complex medially continuing to over the sternum and laterally into the axilla. Electrocautery was then utilized to dissect down to the subcuticular tissue. Allis clamps utilized to elevate the skin flaps and electrocautery was utilized to dissect the breast tissue off of the overlying skin flaps. I continued the superior dissection up to the clavicle medially to the sternum inferior to the mammary cleft and laterally into the axilla. At this point the breast tissue is elevated off of the pectoralis fascia again using electrocautery. Over the midportion of the pectoralis major there is some thickened tissue which  is removed with some muscular tissue. This is suspected to be the area where the tumor had called the chest wall. No obvious evidence of gross tumor is noted at this point. Hemostasis was obtained during this dissection using electrocautery. Upon reaching the axilla the axillary fat plane is entered into a careful dissection is carried out using a right angle clamp. This dissection is carried up underneath the pectoralis to level II dissection. Superiorly continue this dissection up to the axillary vein which was clearly identified. Posteriorly to the border of the latissimus dorsi laterally to the chest wall. Several small vessels were encountered which were clipped with medium surgical clips. Hemostasis was excellent. Upon complete dissection of the level II dissection the specimen was removed en bloc and was sent as a permanent specimen to pathology. The wound was copiously irrigated. Hemostasis was excellent at this time I turned my attention to closure.  To 2 separate stab incisions in the lower lateral aspect of the chest wall skin flaps 2 JP drains were placed. The lateral JP was placed into the axilla. The medial JP that was #1 is placed onto the skin flaps along the chest wall. The deep subcuticular tissues reapproximated using a running 3-0 Vicryl. The drainage secured in place with a 3-0 nylon. The skin edges were reapproximated using skin staples. The skin was washed and dried a moistened dry towel. Betadine ointment was placed over the staple line. Sterile dressings are placed. The drapes were removed, the dressings were secured. Patient was allowed to come out of general anesthetic and stretcher the PACU in stable condition. At the conclusion of procedure all instrument sponge and needle counts  are correct. Patient tolerated procedure extremely well.  Complications:  None  EBL:  150 mL  Specimen:  Left breast and axillary tissue.

## 2011-01-10 LAB — GLUCOSE, CAPILLARY
Glucose-Capillary: 116 mg/dL — ABNORMAL HIGH (ref 70–99)
Glucose-Capillary: 122 mg/dL — ABNORMAL HIGH (ref 70–99)

## 2011-01-10 LAB — BASIC METABOLIC PANEL
BUN: 19 mg/dL (ref 6–23)
Chloride: 100 mEq/L (ref 96–112)
GFR calc Af Amer: 59 mL/min — ABNORMAL LOW (ref >90)
GFR calc non Af Amer: 50 mL/min — ABNORMAL LOW (ref >90)
Potassium: 4 mEq/L (ref 3.5–5.1)
Sodium: 136 mEq/L (ref 135–145)

## 2011-01-10 LAB — CBC
HCT: 28.6 % — ABNORMAL LOW (ref 36.0–46.0)
Hemoglobin: 9.6 g/dL — ABNORMAL LOW (ref 12.0–15.0)
MCHC: 33.6 g/dL (ref 30.0–36.0)
RBC: 3.06 MIL/uL — ABNORMAL LOW (ref 3.87–5.11)

## 2011-01-10 NOTE — Progress Notes (Signed)
1 Day Post-Op  Subjective: Doing well. Pain control. No significant issues. Activity being increased.  Objective: Vital signs in last 24 hours: Temp:  [98 F (36.7 C)-98.4 F (36.9 C)] 98.3 F (36.8 C) (10/27 0600) Pulse Rate:  [54-58] 54  (10/27 0600) Resp:  [18] 18  (10/27 0600) BP: (108-111)/(65-68) 111/65 mmHg (10/27 0600) SpO2:  [96 %-97 %] 97 % (10/27 0600) Weight:  [97.3 kg (214 lb 8.1 oz)] 214 lb 8.1 oz (97.3 kg) (10/27 0600) Last BM Date: 01/09/11  Intake/Output from previous day: 10/26 0701 - 10/27 0700 In: 1240 [P.O.:240; I.V.:1000] Out: 1055 [Urine:950; Drains:55; Blood:50] Intake/Output this shift: Total I/O In: 480 [P.O.:480] Out: 380 [Urine:300; Drains:80]  General appearance: alert and no distress Breasts: Left chest wall incision is clean dry and intact. JPs are present with minimal amount of serous sanguinous discharge. Skin flaps appear healthy and viable. No pain or crepitance to palpation.  Lab Results:  @LABLAST2 (wbc:2,hgb:2,hct:2,plt:2) BMET  Basename 01/10/11 0404 01/09/11 1341  NA 136 --  K 4.0 --  CL 100 --  CO2 30 --  GLUCOSE 106* --  BUN 19 --  CREATININE 1.04 1.00  CALCIUM 10.1 --   PT/INR No results found for this basename: LABPROT:2,INR:2 in the last 72 hours ABG No results found for this basename: PHART:2,PCO2:2,PO2:2,HCO3:2 in the last 72 hours  Studies/Results: No results found.  Anti-infectives: Anti-infectives     Start     Dose/Rate Route Frequency Ordered Stop   01/09/11 0715   ceFAZolin (ANCEF) IVPB 1 g/50 mL premix  Status:  Discontinued        1 g 100 mL/hr over 30 Minutes Intravenous 60 min pre-op 01/09/11 0709 01/09/11 1306          Assessment/Plan: s/p Procedure(s): MASTECTOMY MODIFIED RADICAL Increase activity. Continue oral pain medications. Speech drain care. Likely this charge first thing in the morning.  LOS: 1 day    Dex Blakely C 01/10/2011

## 2011-01-10 NOTE — Anesthesia Postprocedure Evaluation (Signed)
  Anesthesia Post-op Note  Patient: Stacy [name]  Procedure(s) Performed:  MASTECTOMY MODIFIED RADICAL  Patient Location: room 214  Anesthesia Type: General  Level of Consciousness: awake, alert , oriented and patient cooperative  Airway and Oxygen Therapy: Patient Spontanous Breathing  Post-op Pain: none  Post-op Assessment: Post-op Vital signs reviewed, Patient's Cardiovascular Status Stable, Respiratory Function Stable, Patent Airway, No signs of Nausea or vomiting, Adequate PO intake and Pain level controlled  Post-op Vital Signs: Reviewed and stable  Complications: No apparent anesthesia complications

## 2011-01-10 NOTE — Addendum Note (Signed)
Addendum  created 01/10/11 1633 by Despina Hidden   Modules edited:Notes Section

## 2011-01-11 NOTE — Discharge Summary (Signed)
Physician Discharge Summary  Patient ID: Stacy [name] MRN: [id number] DOB/AGE: 1931/09/08 78 y.o.  Admit date: 01/09/2011 Discharge date: 01/11/2011  Admission Diagnoses:Breast Cancer  Discharge Diagnoses: The same Active Problems:  * No active hospital problems. *    Discharged Condition: stable  Hospital Course: Patient was admitted for left MRM.  Tolerated the procedure well.  Watch post-op for wound care and pain control.  Doing well.  Consults: none  Significant Diagnostic Studies: none  Treatments: surgery:Left modified radical mastectomy. Discharge Exam: Blood pressure 108/64, pulse 65, temperature 98.4 F (36.9 C), temperature source Oral, resp. rate 18, height 5\' 5"  (1.651 m), weight 97.5 kg (214 lb 15.2 oz), SpO2 95.00%. General appearance: alert and no distress Chest wall: no tenderness, Skin flaps viable.  JP serodang.  No crepitence.  Disposition: Home or Self Care  Discharge Orders    Future Orders Please Complete By Expires   Diet - low sodium heart healthy      Increase activity slowly      Discharge instructions      Comments:   Increase activity as tolerated.   Discharge wound care:      Comments:   Clean surgical sites with soap and water.  May shower the morning after surgery unless instructed by Dr. Leticia Penna otherwise.  No soaking for 2-3 weeks.    Drain care as discussed.    Driving Restrictions      Comments:   No driving while on pain medications.      Current Discharge Medication List    CONTINUE these medications which have NOT CHANGED   Details  amLODipine (NORVASC) 5 MG tablet Take 1 tablet (5 mg total) by mouth daily. Qty: 30 tablet, Refills: 1    aspirin 81 MG tablet Take 81 mg by mouth daily.      Calcium Carbonate-Vitamin D (CALCIUM 600+D) 600-400 MG-UNIT per tablet Take 1 tablet by mouth daily.      cholecalciferol (VITAMIN D) 1000 UNITS tablet Take 1,000 Units by mouth daily.      escitalopram (LEXAPRO) 10 MG tablet Take  10 mg by mouth daily.      famotidine (PEPCID) 20 MG tablet Take 20 mg by mouth daily.      letrozole (FEMARA) 2.5 MG tablet Take 2.5 mg by mouth daily.      lisinopril-hydrochlorothiazide (PRINZIDE,ZESTORETIC) 20-25 MG per tablet Take 1 tablet by mouth daily.     Associated Diagnoses: Hypertension    Multiple Vitamin (MULTIVITAMIN) tablet Take 1 tablet by mouth daily.      rosuvastatin (CRESTOR) 5 MG tablet Take 5 mg by mouth daily.         Follow-up Information    Follow up with [facility] in 1 week.   Contact information:   [address] [place] [address] [phone]          Signed: Fabio Bering 01/11/2011, 10:04 AM

## 2011-01-11 NOTE — Progress Notes (Signed)
Pt discharged home via family; Pt and family given and explained all discharge instructions, carenotes, and prescriptions; pt and family stated understanding and denied questions/concerns; all f/u appointments in place; IV removed without complicaitons; pt stable at time of discharge  

## 2011-01-13 ENCOUNTER — Encounter (HOSPITAL_COMMUNITY): Payer: Self-pay | Admitting: Emergency Medicine

## 2011-01-13 ENCOUNTER — Emergency Department (HOSPITAL_COMMUNITY)
Admission: EM | Admit: 2011-01-13 | Discharge: 2011-01-13 | Disposition: A | Discharge status: Home / Self Care | Payer: Medicare Other | Attending: Emergency Medicine | Admitting: Emergency Medicine

## 2011-01-13 DIAGNOSIS — E119 Type 2 diabetes mellitus without complications: Secondary | ICD-10-CM | POA: Insufficient documentation

## 2011-01-13 DIAGNOSIS — K5909 Other constipation: Secondary | ICD-10-CM | POA: Insufficient documentation

## 2011-01-13 DIAGNOSIS — K625 Hemorrhage of anus and rectum: Secondary | ICD-10-CM

## 2011-01-13 DIAGNOSIS — Z79899 Other long term (current) drug therapy: Secondary | ICD-10-CM | POA: Insufficient documentation

## 2011-01-13 DIAGNOSIS — I10 Essential (primary) hypertension: Secondary | ICD-10-CM | POA: Insufficient documentation

## 2011-01-13 DIAGNOSIS — K602 Anal fissure, unspecified: Secondary | ICD-10-CM

## 2011-01-13 DIAGNOSIS — D649 Anemia, unspecified: Secondary | ICD-10-CM | POA: Insufficient documentation

## 2011-01-13 DIAGNOSIS — E785 Hyperlipidemia, unspecified: Secondary | ICD-10-CM | POA: Insufficient documentation

## 2011-01-13 HISTORY — DX: Malignant (primary) neoplasm, unspecified: C80.1

## 2011-01-13 LAB — BASIC METABOLIC PANEL
BUN: 19 mg/dL (ref 6–23)
GFR calc Af Amer: 64 mL/min — ABNORMAL LOW (ref >90)
GFR calc non Af Amer: 55 mL/min — ABNORMAL LOW (ref >90)
Potassium: 3.6 mEq/L (ref 3.5–5.1)
Sodium: 134 mEq/L — ABNORMAL LOW (ref 135–145)

## 2011-01-13 LAB — DIFFERENTIAL
Basophils Relative: 0 % (ref 0–1)
Eosinophils Absolute: 0 10*3/uL (ref 0.0–0.7)
Monocytes Relative: 7 % (ref 3–12)
Neutrophils Relative %: 81 % — ABNORMAL HIGH (ref 43–77)

## 2011-01-13 LAB — CBC
MCH: 31.5 pg (ref 26.0–34.0)
MCHC: 34.6 g/dL (ref 30.0–36.0)
Platelets: 221 10*3/uL (ref 150–400)

## 2011-01-13 LAB — OCCULT BLOOD, POC DEVICE: Fecal Occult Bld: POSITIVE

## 2011-01-13 MED ORDER — DOCUSATE SODIUM 100 MG PO CAPS: 100.0000 mg | ORAL_CAPSULE | Freq: Two times a day (BID) | ORAL | Status: AC

## 2011-01-13 MED ORDER — ALUM & MAG HYDROXIDE-SIMETH 200-200-20 MG/5ML PO SUSP
15.0000 mL | Freq: Once | ORAL | Status: AC
  Administered 2011-01-13: 15 mL via ORAL
  Filled 2011-01-13: qty 30

## 2011-01-13 NOTE — ED Provider Notes (Signed)
History    Scribed for Celene Kras, MD, the patient was seen in room APA14/AP[facility]14. This chart was scribed by Katha Cabal.   CSN: [id number] Arrival date & time: 01/13/2011  3:51 PM   First MD Initiated Contact with Patient 01/13/11 1603      Chief Complaint  Patient presents with  . Rectal Bleeding  . Constipation    (Consider location/radiation/quality/duration/timing/severity/associated sxs/prior treatment) HPI Stacy [name] is a 77 y.o. female who presents to the Emergency Department complaining of sudden onset of persistent moderate constipation that began 3 days ago with sudden onset of rectal pain and rectal bleeding.  Sx are not associated with abdominal pain, vomiting or fever.  Patient took 2 stool softeners that provided some relief.  Patient had hard BM. Patient has not had similar sx previously.  Patient had mastectomy 4 days ago.  Patient had appendectomy when she was 77 years old.   Patient with history of DM, HTN, hyperlipemia and anemia.    PCP Aniceto Boss., MD    Past Medical History  Diagnosis Date  . Diabetes mellitus type II   . Hypertension   . Depression   . Hyperlipemia   . Anxiety   . Anemia   . Cancer     Past Surgical History  Procedure Date  . Appendectomy   . Cataract extraction w/ intraocular lens implant 08/26/10    Right; [facility]  . Incisional breast biopsy 09/05/10    Left, [facility], [facility]  . Cataract extraction w/phaco 10/14/2010    Procedure: CATARACT EXTRACTION PHACO AND INTRAOCULAR LENS PLACEMENT (IOC);  Surgeon: Loraine Leriche T. [facility];  Location: [facility];  Service: Ophthalmology;  Laterality: Left;  CDE:  8.79  . Mastectomy     Family History  Problem Relation Age of Onset  . Anesthesia problems Neg Hx     History  Substance Use Topics  . Smoking status: Former Games developer  . Smokeless tobacco: Not on file  . Alcohol Use: No    OB History    Grav Para Term Preterm Abortions TAB SAB Ect Mult Living                  Review of  Systems  All other systems reviewed and are negative.    Allergies  Ciprofloxacin; Ferrous sulfate; and Sulfa drugs cross reactors  Home Medications   Current Outpatient Rx  Name Route Sig Dispense Refill  . AMLODIPINE BESYLATE 5 MG PO TABS Oral Take 1 tablet (5 mg total) by mouth daily. 30 tablet 1  . ASPIRIN 81 MG PO TABS Oral Take 81 mg by mouth daily.      Marland Kitchen CALCIUM CARBONATE-VITAMIN D 600-400 MG-UNIT PO TABS Oral Take 1 tablet by mouth daily.      Marland Kitchen VITAMIN D 1000 UNITS PO TABS Oral Take 1,000 Units by mouth daily.      Marland Kitchen ESCITALOPRAM OXALATE 10 MG PO TABS Oral Take 10 mg by mouth daily.      Marland Kitchen FAMOTIDINE 20 MG PO TABS Oral Take 20 mg by mouth daily.      Marland Kitchen LETROZOLE 2.5 MG PO TABS Oral Take 2.5 mg by mouth daily.      Marland Kitchen LISINOPRIL-HYDROCHLOROTHIAZIDE 20-25 MG PO TABS Oral Take 1 tablet by mouth daily.      Marland Kitchen ONE-DAILY MULTI VITAMINS PO TABS Oral Take 1 tablet by mouth daily.      Marland Kitchen ROSUVASTATIN CALCIUM 5 MG PO TABS Oral Take 5 mg by mouth daily.  BP 120/57  Pulse 74  Temp(Src) 98.1 F (36.7 C) (Oral)  Resp 20  Ht 5\' 3"  (1.6 m)  Wt 200 lb (90.719 kg)  BMI 35.43 kg/m2  SpO2 100%  Physical Exam  Nursing note and vitals reviewed. Constitutional: She appears well-developed and well-nourished. No distress.  HENT:  Head: Normocephalic and atraumatic.  Right Ear: External ear normal.  Left Ear: External ear normal.  Eyes: Conjunctivae are normal. Right eye exhibits no discharge. Left eye exhibits no discharge. No scleral icterus.  Neck: Neck supple. No tracheal deviation present.  Cardiovascular: Normal rate, regular rhythm and intact distal pulses.   Pulmonary/Chest: Effort normal and breath sounds normal. No stridor. No respiratory distress. She has no wheezes. She has no rales.  Abdominal: Soft. Bowel sounds are normal. She exhibits no distension. There is no tenderness. There is no rebound and no guarding.  Genitourinary: Rectal exam shows fissure. Rectal exam  shows no mass.       Brown stool, no active bleeding, anal fissure at 12 o'clock position, pain with digital rectal exam,   Musculoskeletal: She exhibits no edema and no tenderness.  Neurological: She is alert. She has normal strength. No sensory deficit. Cranial nerve deficit:  no gross defecits noted. She exhibits normal muscle tone. She displays no seizure activity. Coordination normal.  Skin: Skin is warm and dry. No rash noted.  Psychiatric: She has a normal mood and affect.    ED Course  Procedures (including critical care time)   DIAGNOSTIC STUDIES: Oxygen Saturation is 100% on room air, normal by my interpretation.    COORDINATION OF CARE:  4:17 PM  Physical and rectal exam complete.  Will order labs.    LABS / RADIOLOGY:  Labs Reviewed  CBC - Abnormal; Notable for the following:    WBC 13.9 (*)    RBC 3.55 (*)    Hemoglobin 11.2 (*)    HCT 32.4 (*)    All other components within normal limits  DIFFERENTIAL - Abnormal; Notable for the following:    Neutrophils Relative 81 (*)    Neutro Abs 11.2 (*)    All other components within normal limits  BASIC METABOLIC PANEL - Abnormal; Notable for the following:    Sodium 134 (*)    Glucose, Bld 174 (*)    GFR calc non Af Amer 55 (*)    GFR calc Af Amer 64 (*)    All other components within normal limits  SAMPLE TO BLOOD BANK  OCCULT BLOOD, POC DEVICE  OCCULT BLOOD, POC DEVICE  POCT OCCULT BLOOD STOOL, DEVICE   No results found.       MDM   MDM: Patient had an episode of rectal bleeding associated with constipation with painful stool. She appears to have a anal fissure associated with this. There is no evidence of active bleeding. The patient has mild anemia but when compared to her old labs she is actually improving from her recent surgery. I doubt any significant GI bleeding. The patient can be managed with local care and close followup with her primary doctor as I suspect the source of this was her fissure.  Patient did have a bowel movement while in emergency room and there is no evidence of impaction on rectal exam     MEDICATIONS GIVEN IN THE E.D. Scheduled Meds:    . alum & mag hydroxide-simeth  15 mL Oral Once   Continuous Infusions:      IMPRESSION: 1. Anal fissure   2.  Rectal bleeding       I personally performed the services described in this documentation, which was scribed in my presence.  The recorded information has been reviewed and considered.           Celene Kras, MD 01/13/11 [phone]

## 2011-01-13 NOTE — ED Notes (Signed)
POC occult blood test positive.

## 2011-01-13 NOTE — ED Notes (Signed)
Patient with no complaints at this time. Respirations even and unlabored. Skin warm/dry. Discharge instructions reviewed with patient at this time. Patient given opportunity to voice concerns/ask questions. Patient discharged at this time and left Emergency Department with steady gait.   

## 2011-01-13 NOTE — ED Notes (Signed)
Pt c/o constipation since Saturday. Pt states she had a mastectomy Friday.

## 2011-01-13 NOTE — ED Notes (Signed)
Patient requesting heart burn medication. States she takes OTC meds for this, and does not recall the name of the medication. Dr. Lynelle Doctor made aware. Awaiting orders.

## 2011-02-04 ENCOUNTER — Encounter (HOSPITAL_COMMUNITY): Payer: Medicare Other | Attending: Oncology | Admitting: Oncology

## 2011-02-04 VITALS — BP 127/72 | HR 67 | Temp 97.7°F | Wt 206.0 lb

## 2011-02-04 DIAGNOSIS — C50219 Malignant neoplasm of upper-inner quadrant of unspecified female breast: Secondary | ICD-10-CM

## 2011-02-04 DIAGNOSIS — C50919 Malignant neoplasm of unspecified site of unspecified female breast: Secondary | ICD-10-CM

## 2011-02-04 DIAGNOSIS — C773 Secondary and unspecified malignant neoplasm of axilla and upper limb lymph nodes: Secondary | ICD-10-CM

## 2011-02-04 NOTE — Progress Notes (Signed)
This office note has been dictated.

## 2011-02-04 NOTE — Progress Notes (Signed)
CC:   Tilford Pillar, MD Lurline Hare, M.D. Salvatore Decent, MD  DIAGNOSIS:  Advanced stage III cancer of the left breast presenting with, at least, a 6 x 6 cm mass, perhaps larger, retraction of the nipple, suspicious lymph nodes in the left axilla, ER receptor-positive 100%, PR receptor-positive 100%, Ki-67 marker 66%, HER-2/neu negative, but felt to be grade 1 initially but at that time of mastectomy, she was felt to be grade 2.  INTERVAL HISTORY:  This is a very pleasant lady who presented with a large mass in the left breast that she had known about for at least 2 years.  She saw my partner, Dr. Dalene Carrow, initially and came back to see me after scans, etc.  She had no evidence for metastatic disease on the CT of the chest, abdomen, and pelvis or her bone scan.  We treated her with hormonal therapy to reduce the size of the mass and, hopefully, make surgery a little bit easier.  She recently underwent definitive surgery by a Dr. Leticia Penna on 01/09/2011.  At that time, she had a 4.8 cm tumor that had both invasive components as well as DCIS.  Margins were absolutely clear.  She had 4/4 lymph nodes involved by tumor, however. She had LVI noticed as well.  The HER-2 was negative.  The largest lymph node, interestingly, was 4.5 cm in greatest dimension and that was actually larger than CT scan had estimated.  The lymph node that was largest essentially had replacement by tumor.  As I mentioned, HER-2 was repeated and once again negative.  She is here today.  She is recuperating quite well.  She looks absolutely great today.  She has no complaints.  She is seeing Dr. Basilio Cairo.  Dr. Basilio Cairo and I, of course, have talked this week about further therapy.  With her positive nodes, she certainly is a potential candidate for chemotherapy potentially but since her cancer is extremely ER/PR positive, the vast majority of benefit will realistically be, at her age, from hormonal therapy.  Therefore,  I have talked to her about chemotherapy, but she is leaning against it.  I think it opens the door for only minimal improvement as I mentioned to her.  She will think about things over the holiday weekend.  She was accompanied today by her husband and her older sister.  PHYSICAL EXAMINATION:  General Appearance:  She is in no acute distress. Vital signs: Her vital signs are very, very stable.  Lymphs:  Her exam today shows no adenopathy in the cervical, supraclavicular, infraclavicular, or axillary areas.  Skin:  The wound is healing very nicely in the left chest wall and axilla.  ASSESSMENT AND PLAN:  We will see her back, one way or the other, in 10 weeks but if she wants to proceed with chemotherapy, I recommend that we do that before radiation.  If we do treat her, I would consider 4 cycles of Taxotere and Cytoxan.  Once again, she will think about things and let me know.    ______________________________ Ladona Horns. Mariel Sleet, MD ESN/MEDQ  D:  02/04/2011  T:  02/04/2011  Job:  [id number]

## 2011-02-04 NOTE — Patient Instructions (Signed)
[facility]  Discharge Instructions Stacy [name]  [id number] 04/03/1931   RECOMMENDATIONS MADE BY THE CONSULTANT AND ANY TEST RESULTS WILL BE SENT TO YOUR REFERRING DOCTOR.   EXAM FINDINGS BY MD TODAY AND SIGNS AND SYMPTOMS TO REPORT TO CLINIC OR PRIMARY MD: Good per Dr. Mariel Sleet.  INSTRUCTIONS GIVEN AND DISCUSSED: Stop taking your Femara prior to starting your radiation therapy.  Restart your Femara within 1 week after your radiation cycle is complete.  See Dr. Mariel Sleet in 10 weeks.  I acknowledge that I have been informed and understand all the instructions given to me and received a copy. I do not have any more questions at this time, but understand that I may call the Specialty Clinic at [facility] at [phone] during business hours should I have any further questions or need assistance in obtaining follow-up care.    __________________________________________  _____________  __________ Signature of Patient or Authorized Representative            Date                   Time    __________________________________________ Nurse's Signature

## 2011-02-10 ENCOUNTER — Ambulatory Visit (HOSPITAL_COMMUNITY): Payer: Medicare Other | Admitting: Oncology

## 2011-04-15 ENCOUNTER — Encounter (HOSPITAL_COMMUNITY): Payer: Medicare Other | Attending: Oncology | Admitting: Oncology

## 2011-04-15 VITALS — BP 103/63 | HR 67 | Temp 98.0°F | Wt 201.2 lb

## 2011-04-15 DIAGNOSIS — C50219 Malignant neoplasm of upper-inner quadrant of unspecified female breast: Secondary | ICD-10-CM

## 2011-04-15 DIAGNOSIS — R062 Wheezing: Secondary | ICD-10-CM

## 2011-04-15 DIAGNOSIS — C50919 Malignant neoplasm of unspecified site of unspecified female breast: Secondary | ICD-10-CM | POA: Insufficient documentation

## 2011-04-15 DIAGNOSIS — C773 Secondary and unspecified malignant neoplasm of axilla and upper limb lymph nodes: Secondary | ICD-10-CM

## 2011-04-15 LAB — COMPREHENSIVE METABOLIC PANEL
AST: 20 U/L (ref 0–37)
BUN: 33 mg/dL — ABNORMAL HIGH (ref 6–23)
CO2: 22 mEq/L (ref 19–32)
Calcium: 10.8 mg/dL — ABNORMAL HIGH (ref 8.4–10.5)
Creatinine, Ser: 1.1 mg/dL (ref 0.50–1.10)
GFR calc Af Amer: 55 mL/min — ABNORMAL LOW (ref >90)
GFR calc non Af Amer: 47 mL/min — ABNORMAL LOW (ref >90)
Glucose, Bld: 125 mg/dL — ABNORMAL HIGH (ref 70–99)

## 2011-04-15 LAB — CBC
HCT: 34 % — ABNORMAL LOW (ref 36.0–46.0)
MCH: 31.6 pg (ref 26.0–34.0)
MCV: 90.9 fL (ref 78.0–100.0)
Platelets: 158 10*3/uL (ref 150–400)
RBC: 3.74 MIL/uL — ABNORMAL LOW (ref 3.87–5.11)

## 2011-04-15 NOTE — Progress Notes (Signed)
This office note has been dictated.

## 2011-04-15 NOTE — Progress Notes (Signed)
CC:   Salvatore Decent, MD Tilford Pillar, MD Lurline Hare, M.D.  DIAGNOSES: 1. Advanced stage III cancer of the left breast with a large mass,     retraction of the nipple.  Mass was at least 6 x 6 cm clinically.     She had suspicious lymph nodes in left axilla at presentation. ER     receptors were 100% positive.  PR receptors 100% positive. Ki-67     marker was 66%.  LVI was seen in the specimen but was still felt to     be a grade 1 cancer.  HER-2 was non-amplified. 2. History of cataract operation. 3. Mild obesity. 4. Hypercholesterolemia on Crestor. 5. History of gastrointestinal upset on famotidine 20 mg a day. 6. History of hypertension. 7. Mild depression on Lexapro. 8. Appendectomy 1946.  This very pleasant lady is here today with her daughter and husband and she was treated initially with Femara in the hopes of reducing the size of this mass making her surgery more easily achievable.  She did very well with the Femara.  She did have reduction in the size of the tumor. At the time of pathological review, her tumor was 4.8 cm in size. Margins were clear.  She had 4/4 lymph nodes involved however.  She had LVI seen.  There was seen to be treatment effect on the tumor at that time of pathological resection which was on 01/09/2011.  She actually presented here for the 1st time 09/16/2010.  She developed a temperature of less than 100 degree she states last Friday and a little cough, a little runny nose.  The cough is nonproductive.  She has not seen yellow-green stuff out of her nose or chest.  She did not go to radiation therapy on Friday, Monday or Tuesday.  She is going to go today.  She feels slightly better.  She has some rhonchi and some wheezes on the left lung only today.  She has erythema of the left chest wall and axilla with some denuding of the skin in the axillary area.  She has no palpable infraclavicular or supraclavicular or cervical nodes.  Her heart shows  a regular rhythm and rate, and, again, she is not febrile today.  I am hoping that this is not radiation pneumonitis, especially since she states that she is better.  She saw a doctor in an urgent care clinic who put her on doxycycline last Friday empirically.  She has 2 more days of that.  She will finish those.  She has no arm edema.  No leg edema today as well.  Her other vital signs are very stable.  We will get a CBC, CMET and CA27.29 next week. After completion of radiation, she will start the Femara once again and I will see her in 12 weeks.  I have talked to Dr. Michell Heinrich about seeing her this Friday because I want to make sure she is clearly getting better over this bronchitis issue.  I certainly trust that it is viral in nature more than anything else at this point in time.    ______________________________ Ladona Horns. Mariel Sleet, MD ESN/MEDQ  D:  04/15/2011  T:  04/15/2011  Job:  [id number]

## 2011-04-15 NOTE — Patient Instructions (Signed)
Stacy [name]  [id number] 01-22-1932   [facility]  Discharge Instructions  RECOMMENDATIONS MADE BY THE CONSULTANT AND ANY TEST RESULTS WILL BE SENT TO YOUR REFERRING DOCTOR.   EXAM FINDINGS BY MD TODAY AND SIGNS AND SYMPTOMS TO REPORT TO CLINIC OR PRIMARY MD: You have some wheezing in your left lung.  Make sure radiation oncologist checks you on Friday. Report any fevers, more cough, etc.  Restart your Femara 1 week after you complete radiation.  MEDICATIONS PRESCRIBED: none   INSTRUCTIONS GIVEN AND DISCUSSED: Other :  Report fevers, worsening cough, etc.  SPECIAL INSTRUCTIONS/FOLLOW-UP: Return to Clinic 3 months to see Dr. Mariel Sleet.   I acknowledge that I have been informed and understand all the instructions given to me and received a copy. I do not have any more questions at this time, but understand that I may call the Specialty Clinic at [facility] at [phone] during business hours should I have any further questions or need assistance in obtaining follow-up care.    __________________________________________  _____________  __________ Signature of Patient or Authorized Representative            Date                   Time    __________________________________________ Nurse's Signature

## 2011-07-14 ENCOUNTER — Ambulatory Visit (HOSPITAL_COMMUNITY): Payer: Medicare Other | Admitting: Oncology

## 2011-07-14 ENCOUNTER — Encounter (HOSPITAL_COMMUNITY): Payer: Medicare Other | Attending: Oncology | Admitting: Oncology

## 2011-07-14 VITALS — BP 110/68 | HR 59 | Temp 97.9°F | Ht 63.0 in | Wt 207.0 lb

## 2011-07-14 DIAGNOSIS — F3289 Other specified depressive episodes: Secondary | ICD-10-CM | POA: Insufficient documentation

## 2011-07-14 DIAGNOSIS — E119 Type 2 diabetes mellitus without complications: Secondary | ICD-10-CM | POA: Insufficient documentation

## 2011-07-14 DIAGNOSIS — C50919 Malignant neoplasm of unspecified site of unspecified female breast: Secondary | ICD-10-CM

## 2011-07-14 DIAGNOSIS — E7800 Pure hypercholesterolemia, unspecified: Secondary | ICD-10-CM | POA: Insufficient documentation

## 2011-07-14 DIAGNOSIS — C773 Secondary and unspecified malignant neoplasm of axilla and upper limb lymph nodes: Secondary | ICD-10-CM

## 2011-07-14 DIAGNOSIS — F329 Major depressive disorder, single episode, unspecified: Secondary | ICD-10-CM | POA: Insufficient documentation

## 2011-07-14 DIAGNOSIS — C50219 Malignant neoplasm of upper-inner quadrant of unspecified female breast: Secondary | ICD-10-CM

## 2011-07-14 DIAGNOSIS — Z170 Estrogen receptor positive status [ER+]: Secondary | ICD-10-CM

## 2011-07-14 DIAGNOSIS — E669 Obesity, unspecified: Secondary | ICD-10-CM | POA: Insufficient documentation

## 2011-07-14 DIAGNOSIS — F411 Generalized anxiety disorder: Secondary | ICD-10-CM | POA: Insufficient documentation

## 2011-07-14 DIAGNOSIS — I10 Essential (primary) hypertension: Secondary | ICD-10-CM

## 2011-07-14 LAB — COMPREHENSIVE METABOLIC PANEL
ALT: 18 U/L (ref 0–35)
Alkaline Phosphatase: 63 U/L (ref 39–117)
BUN: 24 mg/dL — ABNORMAL HIGH (ref 6–23)
Chloride: 103 mEq/L (ref 96–112)
GFR calc Af Amer: 59 mL/min — ABNORMAL LOW (ref >90)
Glucose, Bld: 100 mg/dL — ABNORMAL HIGH (ref 70–99)
Potassium: 4.1 mEq/L (ref 3.5–5.1)
Sodium: 139 mEq/L (ref 135–145)
Total Bilirubin: 0.2 mg/dL — ABNORMAL LOW (ref 0.3–1.2)

## 2011-07-14 LAB — CBC
Hemoglobin: 10.9 g/dL — ABNORMAL LOW (ref 12.0–15.0)
Platelets: 163 10*3/uL (ref 150–400)
RBC: 3.46 MIL/uL — ABNORMAL LOW (ref 3.87–5.11)
WBC: 4.4 10*3/uL (ref 4.0–10.5)

## 2011-07-14 LAB — DIFFERENTIAL
Lymphocytes Relative: 18 % (ref 12–46)
Lymphs Abs: 0.8 10*3/uL (ref 0.7–4.0)
Monocytes Relative: 10 % (ref 3–12)
Neutro Abs: 3 10*3/uL (ref 1.7–7.7)
Neutrophils Relative %: 68 % (ref 43–77)

## 2011-07-14 NOTE — Progress Notes (Signed)
Stacy Stacy [name]., MD, MD [address] [name] [address]  1. Breast cancer  letrozole (FEMARA) 2.5 MG tablet, CBC, Differential, Comprehensive metabolic panel    CURRENT THERAPY: On Femara.  S/P Radiation completed in Feb 2013.  S/P Femara pre-operatively.  S/P left modified radical mastectomy on 01/09/2011.   INTERVAL HISTORY: Stacy Stacy [name] 78 y.o. female returns for  regular  visit for followup of Advanced stage III cancer of the left breast with a large mass, retraction of the nipple. Mass was at least 6 x 6 cm clinically. She had suspicious lymph nodes in left axilla at presentation. ER receptors were 100% positive. PR receptors 100% positive. Ki-67 marker was 66%. LVI was seen in the specimen but was still felt to be a grade 1 cancer. HER-2 was non-amplified.    The patient has completed radiation. She completed radiation beginning of February 2013. She is now back on her femara which is tolerating beautifully. She denies any side effects femara including hot flashes, arthralgias, and myalgias.    Patient reports that her husband had a stroke. She is now caring for him. He is recovering nicely. The majority of his function loss has recovered but he continues to recover the rest of his functionality.  Today, the patient denies any complaints. She denies have any questions. She denies needing any refills of medications we are providing her. Oncologically, patient's review of systems is negative. ROS: No TIA's or unusual headaches, no dysphagia.  No prolonged cough. No dyspnea or chest pain on exertion.  No abdominal pain, change in bowel habits, black or bloody stools.  No urinary tract symptoms.  No new or unusual musculoskeletal symptoms.  Normal menses, no abnormal vaginal bleeding, discharge or unexpected pelvic pain. No new breast lumps, breast pain or nipple discharge.   Past Medical History  Diagnosis Date  . Diabetes mellitus type II   . Hypertension   . Depression   .  Hyperlipemia   . Anxiety   . Anemia   . Cancer     has Breast cancer on her problem list.     is allergic to ciprofloxacin; ferrous sulfate; and sulfa drugs cross reactors.  Stacy Stacy [name] does not currently have medications on file.  Past Surgical History  Procedure Date  . Appendectomy   . Cataract extraction w/ intraocular lens implant 08/26/10    Right; [facility]  . Incisional breast biopsy 09/05/10    Left, [facility], [facility]  . Cataract extraction w/phaco 10/14/2010    Procedure: CATARACT EXTRACTION PHACO AND INTRAOCULAR LENS PLACEMENT (IOC);  Surgeon: Loraine Leriche T. [facility];  Location: [facility];  Service: Ophthalmology;  Laterality: Left;  CDE:  8.79  . Mastectomy     Denies any headaches, dizziness, double vision, fevers, chills, night sweats, nausea, vomiting, diarrhea, constipation, chest pain, heart palpitations, shortness of breath, blood in stool, black tarry stool, urinary pain, urinary burning, urinary frequency, hematuria.   PHYSICAL EXAMINATION  ECOG PERFORMANCE STATUS: 1 - Symptomatic but completely ambulatory  Filed Vitals:   07/14/11 1234  BP: 110/68  Pulse: 59  Temp: 97.9 F (36.6 [name])    GENERAL:alert, no distress, well nourished, well developed, comfortable, cooperative, obese and smiling SKIN: skin color, texture, turgor are normal, no rashes or significant lesions HEAD: Normocephalic, No masses, lesions, tenderness or abnormalities EYES: normal, EOMI, Conjunctiva are pink and non-injected EARS: External ears normal OROPHARYNX:lips, buccal mucosa, and tongue normal and mucous membranes are moist  NECK: supple, no adenopathy, thyroid normal size, non-tender, without  nodularity, no stridor, non-tender, trachea midline LYMPH:  no palpable lymphadenopathy, no hepatosplenomegaly BREAST:patient declines to have breast exam LUNGS: clear to auscultation and percussion HEART: regular rate & rhythm, no murmurs, no gallops, S1 normal and S2 normal ABDOMEN:abdomen soft,  non-tender, obese, normal bowel sounds, no masses or organomegaly and no hepatosplenomegaly BACK: Back symmetric, no curvature., No CVA tenderness EXTREMITIES:less then 2 second capillary refill, no joint deformities, effusion, or inflammation, no edema, no skin discoloration, no clubbing, no cyanosis, compression stockings in place  NEURO: alert & oriented x 3 with fluent speech, no focal motor/sensory deficits, gait normal   PENDING LABS: CBC diff, CMET    PATHOLOGY: 01/09/2011  Diagnosis Breast, modified radical mastectomy , left breast and axillary tissue - INVASIVE AND IN SITU DUCTAL CARCINOMA, 4.8 CM. MARGINS NOT INVOLVED. INVASIVE TUMOR 0.7 CM FROM DEEP MARGIN. - METASTATIC CARCINOMA IN FOUR OF FOUR LYMPH NODES (4/4). Microscopic Comment BREAST, WITH LYMPH NODE SAMPLING Specimen, including laterality: Left breast and axillary tissue Procedure: Mastectomy Tumor size of largest invasive carcinoma (gross measurement or glass slide measurement): 4.8 cm Margins: Invasive component, distance to closest margin: 0.7 cm from deep margin In situ component, distance to closest margin: 0.8 cm from deep margin Lymph - Vascular invasion: Yes Histologic type, invasive component: Ductal Grade, invasive component (Nottingham combined histologic score): II Tubule formation grade: 2 Nuclear pleomorphism grade: 2 Mitotic grade: 2 Ductal carcinoma in situ: Present Nuclear grade: Intermediate Necrosis: No Extensive intraductal component: No Lobular neoplasia present: No Treatment effect (if treated with neoadjuvant therapy): Yes Multicentric (separate tumors in different quadrants): No Multifocal (separate tumors in same quadrant or biopsy): No Macroscopic or microscopic extent of tumor: Skin: Free of tumor Nipple: Dermis of nipple is involved by tumor, no pagetoid involvement Skeletal muscle: N/A 1 of 3 FINAL for Stacy Stacy [name] ([id number]) Microscopic Comment(continued) Axillary  lymph nodes: Number examined: 4 Number with metastasis: 4 ITC (isolated tumor cells, < 0.70mm): 0 Micrometastasis (> 0.50mm, < 2mm): 0 Metastasis > 2 mm: 4 Extracapsular extension: Present, focal Method of detection of metastases: H&E, cytokeratin or both: H & E TNM Code: pT2, pN2a Breast Prognostic Markers: Case # [id number] Estrogen receptor: 100%, positive, strong intensity Progesterone receptor: 100%, positive, strong intensity Ki 67 (Mib-1): 66% Her 2 neu by C[facility]: No amplification, ration 1.31. Will be repeated on current specimen Non-neoplastic breast: Unremarkable (JDP:mw 01-12-11) Jimmy Picket MD Pathologist, Electronic Signature (Case signed 01/12/2011)   ASSESSMENT:  1. Advanced stage III cancer of the left breast with a large mass, retraction of the nipple. Mass was at least 6 x 6 cm clinically. She had suspicious lymph nodes in left axilla at presentation. ER receptors were 100% positive. PR receptors 100% positive. Ki-67 marker was 66%. LVI was seen in the specimen but was still felt to be a grade 1 cancer. HER-2 was non-amplified.  2. History of cataract operation.  3. Mild obesity.  4. Hypercholesterolemia on Crestor.  5. History of gastrointestinal upset on famotidine 20 mg a day.  6. History of hypertension.  7. Mild depression on Lexapro.  8. Appendectomy 1946.   PLAN:  1. Lab work today: CBC diff, CMET. 2. Patient will continue Femara for 5 years.  She will complete therapy in February 2018. 3. Return in 4 months for follow-up.  We may then see her on a bi-yearly basis if she continues to tolerate the Femara well and she does not have any medical issues.   All questions were answered. The patient knows  to call the clinic with any problems, questions or concerns. We can certainly see the patient much sooner if necessary.  Jessamyn Watterson

## 2011-07-14 NOTE — Patient Instructions (Addendum)
Stacy [name]  [id number] 03-26-1931 Dr. Glenford Peers   [facility]  Discharge Instructions  RECOMMENDATIONS MADE BY THE CONSULTANT AND ANY TEST RESULTS WILL BE SENT TO YOUR REFERRING DOCTOR.   EXAM FINDINGS BY MD TODAY AND SIGNS AND SYMPTOMS TO REPORT TO CLINIC OR PRIMARY MD: Exam findings as discussed with T. Kefalas, PA-C.  SPECIAL INSTRUCTIONS/FOLLOW-UP: 1.  You had labs done today and we will contact you for any abnormal results. 2.  Return to see Dr. Mariel Sleet in 4 months as scheduled.  I acknowledge that I have been informed and understand all the instructions given to me and received a copy. I do not have any more questions at this time, but understand that I may call the Specialty Clinic at [facility] at [phone] during business hours should I have any further questions or need assistance in obtaining follow-up care.    __________________________________________  _____________  __________ Signature of Patient or Authorized Representative            Date                   Time    __________________________________________ Nurse's Signature

## 2011-09-16 ENCOUNTER — Other Ambulatory Visit (HOSPITAL_COMMUNITY): Payer: Self-pay | Admitting: Oncology

## 2011-09-16 DIAGNOSIS — Z9010 Acquired absence of unspecified breast and nipple: Secondary | ICD-10-CM

## 2011-09-16 DIAGNOSIS — Z139 Encounter for screening, unspecified: Secondary | ICD-10-CM

## 2011-10-19 ENCOUNTER — Ambulatory Visit (HOSPITAL_COMMUNITY)
Admission: RE | Admit: 2011-10-19 | Discharge: 2011-10-19 | Disposition: A | Discharge status: Home / Self Care | Payer: Medicare Other | Source: Ambulatory Visit | Attending: Oncology | Admitting: Oncology

## 2011-10-19 DIAGNOSIS — Z9010 Acquired absence of unspecified breast and nipple: Secondary | ICD-10-CM

## 2011-10-19 DIAGNOSIS — Z1231 Encounter for screening mammogram for malignant neoplasm of breast: Secondary | ICD-10-CM | POA: Insufficient documentation

## 2011-10-19 DIAGNOSIS — Z139 Encounter for screening, unspecified: Secondary | ICD-10-CM

## 2011-11-13 ENCOUNTER — Encounter (HOSPITAL_COMMUNITY): Payer: Self-pay | Admitting: Oncology

## 2011-11-13 ENCOUNTER — Ambulatory Visit (HOSPITAL_COMMUNITY): Payer: Medicare Other | Admitting: Oncology

## 2011-11-13 ENCOUNTER — Encounter (HOSPITAL_COMMUNITY): Payer: Medicare Other | Attending: Oncology | Admitting: Oncology

## 2011-11-13 VITALS — BP 115/56 | HR 56 | Temp 97.6°F | Resp 16 | Wt 214.2 lb

## 2011-11-13 DIAGNOSIS — C50219 Malignant neoplasm of upper-inner quadrant of unspecified female breast: Secondary | ICD-10-CM

## 2011-11-13 DIAGNOSIS — C773 Secondary and unspecified malignant neoplasm of axilla and upper limb lymph nodes: Secondary | ICD-10-CM

## 2011-11-13 DIAGNOSIS — Z170 Estrogen receptor positive status [ER+]: Secondary | ICD-10-CM

## 2011-11-13 DIAGNOSIS — C50919 Malignant neoplasm of unspecified site of unspecified female breast: Secondary | ICD-10-CM | POA: Insufficient documentation

## 2011-11-13 LAB — COMPREHENSIVE METABOLIC PANEL
CO2: 25 mEq/L (ref 19–32)
Calcium: 10.3 mg/dL (ref 8.4–10.5)
Chloride: 105 mEq/L (ref 96–112)
Creatinine, Ser: 1.11 mg/dL — ABNORMAL HIGH (ref 0.50–1.10)
GFR calc Af Amer: 54 mL/min — ABNORMAL LOW (ref >90)
GFR calc non Af Amer: 46 mL/min — ABNORMAL LOW (ref >90)
Glucose, Bld: 95 mg/dL (ref 70–99)
Total Bilirubin: 0.3 mg/dL (ref 0.3–1.2)

## 2011-11-13 LAB — CBC WITH DIFFERENTIAL/PLATELET
Eosinophils Relative: 10 % — ABNORMAL HIGH (ref 0–5)
HCT: 31.6 % — ABNORMAL LOW (ref 36.0–46.0)
Hemoglobin: 10.8 g/dL — ABNORMAL LOW (ref 12.0–15.0)
Lymphocytes Relative: 14 % (ref 12–46)
Lymphs Abs: 0.7 10*3/uL (ref 0.7–4.0)
MCV: 93.5 fL (ref 78.0–100.0)
Monocytes Absolute: 0.6 10*3/uL (ref 0.1–1.0)
Monocytes Relative: 13 % — ABNORMAL HIGH (ref 3–12)
Neutro Abs: 3 10*3/uL (ref 1.7–7.7)
RBC: 3.38 MIL/uL — ABNORMAL LOW (ref 3.87–5.11)
WBC: 4.8 10*3/uL (ref 4.0–10.5)

## 2011-11-13 NOTE — Patient Instructions (Addendum)
Stacy [name]  DOB 01-Nov-1931 CSN [id number]  MRN [id number] Dr. Glenford Peers   [facility]  Discharge Instructions  RECOMMENDATIONS MADE BY THE CONSULTANT AND ANY TEST RESULTS WILL BE SENT TO YOUR REFERRING DOCTOR.   EXAM FINDINGS BY MD TODAY AND SIGNS AND SYMPTOMS TO REPORT TO CLINIC OR PRIMARY MD: You are doing well.  No evidence of recurrence by exam.  Will check some labwork today and if there are any abnormalities we will call you.  Continue taking your Femara (letrozole).  MEDICATIONS PRESCRIBED: none   INSTRUCTIONS GIVEN AND DISCUSSED: Other : Report any new lumps, bone pain or shortness of breath.  SPECIAL INSTRUCTIONS/FOLLOW-UP: Lab work Needed today and Return to Clinic in 4 months to see the PA.   I acknowledge that I have been informed and understand all the instructions given to me and received a copy. I do not have any more questions at this time, but understand that I may call the Specialty Clinic at [facility] at [phone] during business hours should I have any further questions or need assistance in obtaining follow-up care.    __________________________________________  _____________  __________ Signature of Patient or Authorized Representative            Date                   Time    __________________________________________ Nurse's Signature

## 2011-11-13 NOTE — Progress Notes (Signed)
Problem #1 advanced stage III cancer the left breast with a large mass and retraction of the nipple. The mass was only 6 x 6 cm clinically suspicious lymph nodes in the left axilla at presentation. ER receptors were 100% positive progesterone receptors were 100%, Ki-67 marker was 66% LV I was seen but this was still felt to be a grade 1 cancer. HER-2/neu was nonamplified. She underwent neoadjuvant letrozole in the hopes of reducing the size of the mass making surgery somewhat easier. She tolerated the letrozole quite well and there was reduction in the size of the tumor but upon pathological review her cancer still 4.8 cm in size for 4 lymph nodes were involved the margins were clear. She was started on the letrozole after completion of radiation therapy postoperatively.   her other problems are in my note from March 19 2011.  She is tolerating the letrozole quite well once again. She does well fully active asymptomatic today. Her review of systems is noncontributory.  Vital signs are stable but her weight is still excessive. She is up 13 pounds since January. Lymph nodes remain negative throughout. The left chest wall is clear. The right breast is negative. Her lungs are clear. Heart shows a regular rhythm and rate without murmur rub or gallop. Abdomen is soft obese but without obvious hepatosplenomegaly. She has no leg edema no arm edema.  Her labs are pending.  Will continue the letrozole for at least 5 years and possibly longer.

## 2012-03-14 ENCOUNTER — Encounter (HOSPITAL_COMMUNITY): Payer: Self-pay | Admitting: Oncology

## 2012-03-14 ENCOUNTER — Encounter (HOSPITAL_COMMUNITY): Payer: Medicare Other | Attending: Oncology | Admitting: Oncology

## 2012-03-14 VITALS — BP 123/67 | HR 58 | Temp 97.8°F | Resp 16 | Wt 212.5 lb

## 2012-03-14 DIAGNOSIS — C773 Secondary and unspecified malignant neoplasm of axilla and upper limb lymph nodes: Secondary | ICD-10-CM

## 2012-03-14 DIAGNOSIS — Z170 Estrogen receptor positive status [ER+]: Secondary | ICD-10-CM

## 2012-03-14 DIAGNOSIS — Z853 Personal history of malignant neoplasm of breast: Secondary | ICD-10-CM | POA: Insufficient documentation

## 2012-03-14 DIAGNOSIS — Z09 Encounter for follow-up examination after completed treatment for conditions other than malignant neoplasm: Secondary | ICD-10-CM | POA: Insufficient documentation

## 2012-03-14 DIAGNOSIS — C50919 Malignant neoplasm of unspecified site of unspecified female breast: Secondary | ICD-10-CM

## 2012-03-14 DIAGNOSIS — C50219 Malignant neoplasm of upper-inner quadrant of unspecified female breast: Secondary | ICD-10-CM

## 2012-03-14 DIAGNOSIS — F3289 Other specified depressive episodes: Secondary | ICD-10-CM | POA: Insufficient documentation

## 2012-03-14 DIAGNOSIS — F329 Major depressive disorder, single episode, unspecified: Secondary | ICD-10-CM | POA: Insufficient documentation

## 2012-03-14 DIAGNOSIS — I10 Essential (primary) hypertension: Secondary | ICD-10-CM | POA: Insufficient documentation

## 2012-03-14 DIAGNOSIS — E7800 Pure hypercholesterolemia, unspecified: Secondary | ICD-10-CM | POA: Insufficient documentation

## 2012-03-14 DIAGNOSIS — E669 Obesity, unspecified: Secondary | ICD-10-CM | POA: Insufficient documentation

## 2012-03-14 LAB — COMPREHENSIVE METABOLIC PANEL
ALT: 17 U/L (ref 0–35)
AST: 17 U/L (ref 0–37)
Alkaline Phosphatase: 69 U/L (ref 39–117)
CO2: 25 mEq/L (ref 19–32)
Calcium: 10.1 mg/dL (ref 8.4–10.5)
Chloride: 104 mEq/L (ref 96–112)
GFR calc non Af Amer: 48 mL/min — ABNORMAL LOW (ref >90)
Glucose, Bld: 106 mg/dL — ABNORMAL HIGH (ref 70–99)
Potassium: 4.3 mEq/L (ref 3.5–5.1)
Sodium: 139 mEq/L (ref 135–145)

## 2012-03-14 LAB — DIFFERENTIAL
Basophils Absolute: 0 10*3/uL (ref 0.0–0.1)
Eosinophils Absolute: 0.4 10*3/uL (ref 0.0–0.7)
Eosinophils Relative: 9 % — ABNORMAL HIGH (ref 0–5)
Lymphocytes Relative: 19 % (ref 12–46)
Monocytes Absolute: 0.5 10*3/uL (ref 0.1–1.0)

## 2012-03-14 LAB — CBC
Hemoglobin: 11.6 g/dL — ABNORMAL LOW (ref 12.0–15.0)
MCH: 32.2 pg (ref 26.0–34.0)
MCHC: 34.2 g/dL (ref 30.0–36.0)
Platelets: 184 10*3/uL (ref 150–400)
RBC: 3.6 MIL/uL — ABNORMAL LOW (ref 3.87–5.11)

## 2012-03-14 NOTE — Patient Instructions (Addendum)
[facility] Discharge Instructions  RECOMMENDATIONS MADE BY THE CONSULTANT AND ANY TEST RESULTS WILL BE SENT TO YOUR REFERRING PHYSICIAN.  EXAM FINDINGS BY THE PHYSICIAN TODAY AND SIGNS OR SYMPTOMS TO REPORT TO CLINIC OR PRIMARY PHYSICIAN: exam and discussion by PA.  No evidence of recurrence by exam.  We will check some blood work today and see you back in the spring.  Continue Letrozole.  MEDICATIONS PRESCRIBED:  none  INSTRUCTIONS GIVEN AND DISCUSSED: Report any new lumps, bone pain or shortness of breath.  SPECIAL INSTRUCTIONS/FOLLOW-UP: Return for follow-up in 4 months to see Dr. Mariel Sleet.  Thank you for choosing [facility] to provide your oncology and hematology care.  To afford each patient quality time with our providers, please arrive at least 15 minutes before your scheduled appointment time.  With your help, our goal is to use those 15 minutes to complete the necessary work-up to ensure our physicians have the information they need to help with your evaluation and healthcare recommendations.    Effective January 1st, 2014, we ask that you re-schedule your appointment with our physicians should you arrive 10 or more minutes late for your appointment.  We strive to give you quality time with our providers, and arriving late affects you and other patients whose appointments are after yours.    Again, thank you for choosing [facility].  Our hope is that these requests will decrease the amount of time that you wait before being seen by our physicians.       _____________________________________________________________  I acknowledge that I have been informed and understand all the instructions given to me and received a copy. I do not have anymore questions at this time but understand that I may call the Cancer Center at [facility] at [phone] during business hours should I have any further questions or need assistance in obtaining  follow-up care.

## 2012-03-14 NOTE — Progress Notes (Signed)
StacyMINESH, MD No address on file  1. Breast cancer     CURRENT THERAPY: Letrozole daily  INTERVAL HISTORY: Stacy [name] 78 y.o. female returns for  regular  visit for followup of advanced stage III cancer the left breast with a large mass and retraction of the nipple. The mass was 6 x 6 cm clinically with suspicious lymph nodes in the left axilla at presentation. ER receptors were 100% positive progesterone receptors were 100%, Ki-67 marker was 66% LV I was seen but this was still felt to be a grade 1 cancer. HER-2/neu was nonamplified. She underwent neoadjuvant letrozole in the hopes of reducing the size of the mass making surgery somewhat easier. She tolerated the letrozole quite well and there was reduction in the size of the tumor but upon pathological review her cancer still 4.8 cm in size, 4 lymph nodes were involved, the margins were clear. She was started on the letrozole in Feb 2013 after completion of radiation therapy postoperatively.  Stacy [name] is doing well.  Her husband has recovered from his stroke, but he now has early dementia.  She reports that his memory is poor.  They went camping over the summer and he could not remember the name of the people they were camping with, but they have camped together for over 20 years.  Stacy [name] also mentions that he forgets directions.  He continues to drive and I am not sure that is the best for him.    On the other hand, Stacy [name] is doing great.  She is tolerating the letrozole well.  She re-initiated this in February 2013 after completion of her radiation. She denies any hot flashes, arthralgias, and myalgias.    She had a nice Christmas holiday.    Complete ROS questioning is negative.   I personally reviewed and went over laboratory results with the patient.  Her labs from August were discussed and we will get labs today.   I personally reviewed and went over radiographic studies with the patient.  Her mammogram in August 2013 was negative.   She will be due for another next year.  She reports that she has a bone density in July.     Past Medical History  Diagnosis Date  . Diabetes mellitus type II   . Hypertension   . Depression   . Hyperlipemia   . Anxiety   . Anemia   . Cancer     has Breast cancer on her problem list.     is allergic to ciprofloxacin; ferrous sulfate; and sulfa drugs cross reactors.  [name] had no medications administered during this visit.  Past Surgical History  Procedure Date  . Appendectomy   . Cataract extraction w/ intraocular lens implant 08/26/10    Right; [facility]  . Incisional breast biopsy 09/05/10    Left, [facility], [facility]  . Cataract extraction w/phaco 10/14/2010    Procedure: CATARACT EXTRACTION PHACO AND INTRAOCULAR LENS PLACEMENT (IOC);  Surgeon: Loraine Leriche T. [facility];  Location: [facility];  Service: Ophthalmology;  Laterality: Left;  CDE:  8.79  . Mastectomy     Denies any headaches, dizziness, double vision, fevers, chills, night sweats, nausea, vomiting, diarrhea, constipation, chest pain, heart palpitations, shortness of breath, blood in stool, black tarry stool, urinary pain, urinary burning, urinary frequency, hematuria.   PHYSICAL EXAMINATION  ECOG PERFORMANCE STATUS: 0 - Asymptomatic  Filed Vitals:   03/14/12 1029  BP: 123/67  Pulse: 58  Temp: 97.8 F (36.6 C)  Resp: 16  GENERAL:alert, no distress, well nourished, well developed, comfortable, cooperative, obese and smiling SKIN: skin color, texture, turgor are normal, no rashes or significant lesions HEAD: Normocephalic, No masses, lesions, tenderness or abnormalities EYES: normal, Conjunctiva are pink and non-injected EARS: External ears normal OROPHARYNX:lips, buccal mucosa, and tongue normal and mucous membranes are moist  NECK: supple, no adenopathy, thyroid normal size, non-tender, without nodularity, no stridor, non-tender, trachea midline LYMPH:  no palpable lymphadenopathy, no  hepatosplenomegaly BREAST:right breast normal without mass, skin or nipple changes or axillary nodes, left post-mastectomy site well healed and free of suspicious changes LUNGS: clear to auscultation and percussion HEART: regular rate & rhythm, no murmurs, no gallops, S1 normal and S2 normal ABDOMEN:abdomen soft, non-tender, obese, normal bowel sounds, no masses or organomegaly, difficult to assess for hepatosplenomegaly due to body habitus, but no hepatosplenomegaly BACK: Back symmetric, no curvature. EXTREMITIES:less then 2 second capillary refill, no joint deformities, effusion, or inflammation, no edema, no skin discoloration, no clubbing, no cyanosis  NEURO: alert & oriented x 3 with fluent speech, no focal motor/sensory deficits, gait normal   LABORATORY DATA: CBC    Component Value Date/Time   WBC 4.8 11/13/2011 1144   RBC 3.38* 11/13/2011 1144   HGB 10.8* 11/13/2011 1144   HCT 31.6* 11/13/2011 1144   PLT 161 11/13/2011 1144   MCV 93.5 11/13/2011 1144   MCH 32.0 11/13/2011 1144   MCHC 34.2 11/13/2011 1144   RDW 13.8 11/13/2011 1144   LYMPHSABS 0.7 11/13/2011 1144   MONOABS 0.6 11/13/2011 1144   EOSABS 0.5 11/13/2011 1144   BASOSABS 0.0 11/13/2011 1144      Chemistry      Component Value Date/Time   NA 140 11/13/2011 1144   K 4.0 11/13/2011 1144   CL 105 11/13/2011 1144   CO2 25 11/13/2011 1144   BUN 27* 11/13/2011 1144   CREATININE 1.11* 11/13/2011 1144      Component Value Date/Time   CALCIUM 10.3 11/13/2011 1144   ALKPHOS 68 11/13/2011 1144   AST 18 11/13/2011 1144   ALT 17 11/13/2011 1144   BILITOT 0.3 11/13/2011 1144        RADIOGRAPHIC STUDIES:  10/19/2011  *RADIOLOGY REPORT*  Clinical Data: Screening.  MAMMOGRAPHIC UNILATERAL RIGHT DIGITAL SCREENING WITH CAD  Comparison: Previous exams.  Findings: There are scattered fibroglandular densities. No  suspicious masses, architectural distortion, or calcifications are  present.  Images were processed with CAD.  IMPRESSION:   No mammographic evidence of malignancy.  A result letter of this screening mammogram will be mailed directly  to the patient.  RECOMMENDATION:  Screening mammogram in one year. (Code:SM-B-01Y)  BI-RADS CATEGORY 1: Negative.  Original Report Authenticated By: Harrel Lemon, M.D.    PATHOLOGY: 01/09/2011  Diagnosis  Breast, modified radical mastectomy , left breast and axillary tissue  - INVASIVE AND IN SITU DUCTAL CARCINOMA, 4.8 CM. MARGINS NOT INVOLVED. INVASIVE  TUMOR  0.7 CM FROM DEEP MARGIN.  - METASTATIC CARCINOMA IN FOUR OF FOUR LYMPH NODES (4/4).  Microscopic Comment  BREAST, WITH LYMPH NODE SAMPLING  Specimen, including laterality: Left breast and axillary tissue  Procedure: Mastectomy  Tumor size of largest invasive carcinoma (gross measurement or glass slide measurement): 4.8 cm  Margins:  Invasive component, distance to closest margin: 0.7 cm from deep margin  In situ component, distance to closest margin: 0.8 cm from deep margin  Lymph - Vascular invasion: Yes  Histologic type, invasive component: Ductal  Grade, invasive component (Nottingham combined histologic score): II  Tubule formation grade: 2  Nuclear pleomorphism grade: 2  Mitotic grade: 2  Ductal carcinoma in situ: Present  Nuclear grade: Intermediate  Necrosis: No  Extensive intraductal component: No  Lobular neoplasia present: No  Treatment effect (if treated with neoadjuvant therapy): Yes  Multicentric (separate tumors in different quadrants): No  Multifocal (separate tumors in same quadrant or biopsy): No  Macroscopic or microscopic extent of tumor:  Skin: Free of tumor  Nipple: Dermis of nipple is involved by tumor, no pagetoid involvement  Skeletal muscle: N/A  1 of 3  FINAL for Biber, Arleene C ([id number])  Microscopic Comment(continued)  Axillary lymph nodes:  Number examined: 4  Number with metastasis: 4  ITC (isolated tumor cells, < 0.23mm): 0  Micrometastasis (> 0.58mm, < 2mm): 0   Metastasis > 2 mm: 4  Extracapsular extension: Present, focal  Method of detection of metastases:  H&E, cytokeratin or both: H & E  TNM Code: pT2, pN2a  Breast Prognostic Markers: Case # [id number]  Estrogen receptor: 100%, positive, strong intensity  Progesterone receptor: 100%, positive, strong intensity  Ki 67 (Mib-1): 66%  Her 2 neu by C[facility]: No amplification, ration 1.31. Will be repeated on current specimen  Non-neoplastic breast: Unremarkable (JDP:mw 01-12-11)  Jimmy Picket MD  Pathologist, Electronic Signature  (Case signed 01/12/2011)     ASSESSMENT:  1. Advanced stage III cancer the left breast with a large mass and retraction of the nipple. The mass was 6 x 6 cm clinically with suspicious lymph nodes in the left axilla at presentation. ER receptors were 100% positive progesterone receptors were 100%, Ki-67 marker was 66% LV I was seen but this was still felt to be a grade 1 cancer. HER-2/neu was nonamplified. She underwent neoadjuvant letrozole in the hopes of reducing the size of the mass making surgery somewhat easier. She tolerated the letrozole quite well and there was reduction in the size of the tumor but upon pathological review her cancer still 4.8 cm in size, 4 lymph nodes were involved, the margins were clear. She was started on the Letrozole in Feb 2013 after completion of radiation therapy postoperatively. 2. History of cataract operation.  3. Mild obesity.  4. Hypercholesterolemia on Crestor.  5. History of gastrointestinal upset on famotidine 20 mg a day.  6. History of hypertension.  7. Mild depression on Lexapro.  8. Appendectomy 1946.   PLAN:  1. I personally reviewed and went over laboratory results with the patient. 2. I personally reviewed and went over radiographic studies with the patient. 3. Lab work today: CBC diff, CMET 4. Continue Letrozole daily 5. Mammogram due in August 2014. 6. Bone density in July 2014 as ordered 7. Return in 4 months for  follow-up.     All questions were answered. The patient knows to call the clinic with any problems, questions or concerns. We can certainly see the patient much sooner if necessary.  Patient and plan will be discussed with Dr. Mariel Sleet in the near future.   Mykeal Carrick

## 2012-04-17 ENCOUNTER — Other Ambulatory Visit (HOSPITAL_COMMUNITY): Payer: Self-pay | Admitting: Oncology

## 2012-07-13 ENCOUNTER — Encounter (HOSPITAL_COMMUNITY): Payer: Medicare Other | Attending: Oncology | Admitting: Oncology

## 2012-07-13 VITALS — BP 128/70 | HR 62 | Temp 97.8°F | Resp 18 | Wt 213.7 lb

## 2012-07-13 DIAGNOSIS — C773 Secondary and unspecified malignant neoplasm of axilla and upper limb lymph nodes: Secondary | ICD-10-CM

## 2012-07-13 DIAGNOSIS — C50912 Malignant neoplasm of unspecified site of left female breast: Secondary | ICD-10-CM

## 2012-07-13 DIAGNOSIS — Z170 Estrogen receptor positive status [ER+]: Secondary | ICD-10-CM

## 2012-07-13 DIAGNOSIS — C50219 Malignant neoplasm of upper-inner quadrant of unspecified female breast: Secondary | ICD-10-CM

## 2012-07-13 NOTE — Patient Instructions (Addendum)
[facility] Discharge Instructions  RECOMMENDATIONS MADE BY THE CONSULTANT AND ANY TEST RESULTS WILL BE SENT TO YOUR REFERRING PHYSICIAN.  EXAM FINDINGS BY THE PHYSICIAN TODAY AND SIGNS OR SYMPTOMS TO REPORT TO CLINIC OR PRIMARY PHYSICIAN: exam and discussion by MD.  Stacy [name] are doing well.  No evidence of recurrence by exam.    MEDICATIONS PRESCRIBED:  Continue Letrozole  INSTRUCTIONS GIVEN AND DISCUSSED: Report any new lumps, bone pain, shortness of breath or other symptoms.  SPECIAL INSTRUCTIONS/FOLLOW-UP: Follow-up with blood work and to see PA in 3 months.  Thank you for choosing [facility] to provide your oncology and hematology care.  To afford each patient quality time with our providers, please arrive at least 15 minutes before your scheduled appointment time.  With your help, our goal is to use those 15 minutes to complete the necessary work-up to ensure our physicians have the information they need to help with your evaluation and healthcare recommendations.    Effective January 1st, 2014, we ask that you re-schedule your appointment with our physicians should you arrive 10 or more minutes late for your appointment.  We strive to give you quality time with our providers, and arriving late affects you and other patients whose appointments are after yours.    Again, thank you for choosing [facility].  Our hope is that these requests will decrease the amount of time that you wait before being seen by our physicians.       _____________________________________________________________  Should you have questions after your visit to [facility], please contact our office at [phone] between the hours of 8:30 a.m. and 5:00 p.m.  Voicemails left after 4:30 p.m. will not be returned until the following business day.  For prescription refill requests, have your pharmacy contact our office with your prescription refill request.

## 2012-07-13 NOTE — Progress Notes (Signed)
Problem #1 advanced stage III cancer the left breast presenting with a large mass, retraction of the nipple, 6 x 6 cm clinically with suspicious lymph nodes in the left axilla presentation. ER +100%, PR +100%, Ki-67 are her 66%, LV I was seen, but was felt to be grade 1. HER-2/neu was nonamplified. We treated her with neoadjuvant letrozole for several months. She then underwent surgery but still had a 4.8 cm cancer. Clinically there was reduction in the size of the tumor. 4 of 4 lymph nodes were involved. She then completed radiation therapy postoperatively and was restarted on the letrozole. She continues on today. Her oncology review of systems is negative. She feels fine, remains fully active, has no complaints that are new or different.  Her vital signs are stable weight is still excessive at 213 pounds. She is in no acute distress. She is alert and oriented. She has no palpable nodules in the left chest wall or left axilla. She has no supraclavicular nodes cervical nodes infraclavicular nodes axillary nodes or inguinal areas. Her abdomen remains obese but without hepatosplenomegaly that is obvious. She does not have arm or leg edema. Lungs are clear to auscultation and percussion. Heart shows a regular rhythm and rate without murmur rub gallop. She has no thyromegaly. The right breast is negative for masses.  She is doing very well we'll continue the letrozole and see her back in 3 months. She does not need blood work today. We'll do that in 3 months.

## 2012-07-30 ENCOUNTER — Emergency Department (HOSPITAL_COMMUNITY)
Admission: EM | Admit: 2012-07-30 | Discharge: 2012-07-30 | Disposition: A | Discharge status: Home / Self Care | Payer: Medicare Other | Attending: Emergency Medicine | Admitting: Emergency Medicine

## 2012-07-30 ENCOUNTER — Encounter (HOSPITAL_COMMUNITY): Payer: Self-pay

## 2012-07-30 DIAGNOSIS — R079 Chest pain, unspecified: Secondary | ICD-10-CM | POA: Insufficient documentation

## 2012-07-30 DIAGNOSIS — Z79899 Other long term (current) drug therapy: Secondary | ICD-10-CM | POA: Insufficient documentation

## 2012-07-30 DIAGNOSIS — E119 Type 2 diabetes mellitus without complications: Secondary | ICD-10-CM | POA: Insufficient documentation

## 2012-07-30 DIAGNOSIS — Z87891 Personal history of nicotine dependence: Secondary | ICD-10-CM | POA: Insufficient documentation

## 2012-07-30 DIAGNOSIS — Z862 Personal history of diseases of the blood and blood-forming organs and certain disorders involving the immune mechanism: Secondary | ICD-10-CM | POA: Insufficient documentation

## 2012-07-30 DIAGNOSIS — E785 Hyperlipidemia, unspecified: Secondary | ICD-10-CM | POA: Insufficient documentation

## 2012-07-30 DIAGNOSIS — I890 Lymphedema, not elsewhere classified: Secondary | ICD-10-CM | POA: Insufficient documentation

## 2012-07-30 DIAGNOSIS — I10 Essential (primary) hypertension: Secondary | ICD-10-CM | POA: Insufficient documentation

## 2012-07-30 DIAGNOSIS — Z7982 Long term (current) use of aspirin: Secondary | ICD-10-CM | POA: Insufficient documentation

## 2012-07-30 DIAGNOSIS — Z859 Personal history of malignant neoplasm, unspecified: Secondary | ICD-10-CM | POA: Insufficient documentation

## 2012-07-30 LAB — CBC WITH DIFFERENTIAL/PLATELET
Eosinophils Absolute: 0.3 10*3/uL (ref 0.0–0.7)
Eosinophils Relative: 8 % — ABNORMAL HIGH (ref 0–5)
HCT: 32.8 % — ABNORMAL LOW (ref 36.0–46.0)
Lymphocytes Relative: 18 % (ref 12–46)
Lymphs Abs: 0.7 10*3/uL (ref 0.7–4.0)
MCH: 31.9 pg (ref 26.0–34.0)
MCV: 94.3 fL (ref 78.0–100.0)
Monocytes Absolute: 0.3 10*3/uL (ref 0.1–1.0)
Platelets: 166 10*3/uL (ref 150–400)
RBC: 3.48 MIL/uL — ABNORMAL LOW (ref 3.87–5.11)

## 2012-07-30 LAB — BASIC METABOLIC PANEL
BUN: 29 mg/dL — ABNORMAL HIGH (ref 6–23)
CO2: 25 mEq/L (ref 19–32)
Calcium: 10.1 mg/dL (ref 8.4–10.5)
Creatinine, Ser: 1.06 mg/dL (ref 0.50–1.10)
GFR calc non Af Amer: 49 mL/min — ABNORMAL LOW (ref >90)
Glucose, Bld: 151 mg/dL — ABNORMAL HIGH (ref 70–99)
Sodium: 138 mEq/L (ref 135–145)

## 2012-07-30 NOTE — ED Provider Notes (Signed)
History     CSN: [id number]  Arrival date & time 07/30/12  7829   First MD Initiated Contact with Patient 07/30/12 [phone]      Chief Complaint  Patient presents with  . Arm Swelling    (Consider location/radiation/quality/duration/timing/severity/associated sxs/prior treatment) HPI Comments: Pt has a hx of left mastectomy due to cancer.. She has hx of DM, hbp, and anxiety/depression. This AM she awaken wth swelling of the left arm. No reported injury. She has been active, but no new or strenuous activities. Pt has noted very little swelling prior to this situation. No fever. No chills. No SOB. No chest pain. NO Syncope or weakness.   Past Medical History  Diagnosis Date  . Diabetes mellitus type II   . Hypertension   . Depression   . Hyperlipemia   . Anxiety   . Anemia   . Cancer     Past Surgical History  Procedure Laterality Date  . Appendectomy    . Cataract extraction w/ intraocular lens implant  08/26/10    Right; [facility]  . Incisional breast biopsy  09/05/10    Left, [facility], [facility]  . Cataract extraction w/phaco  10/14/2010    Procedure: CATARACT EXTRACTION PHACO AND INTRAOCULAR LENS PLACEMENT (IOC);  Surgeon: Loraine Leriche T. [facility];  Location: [facility];  Service: Ophthalmology;  Laterality: Left;  CDE:  8.79  . Mastectomy      Family History  Problem Relation Age of Onset  . Anesthesia problems Neg Hx     History  Substance Use Topics  . Smoking status: Former Games developer  . Smokeless tobacco: Never Used  . Alcohol Use: No    OB History   Grav Para Term Preterm Abortions TAB SAB Ect Mult Living                  Review of Systems  Constitutional: Negative for activity change.       All ROS Neg except as noted in HPI  HENT: Negative for nosebleeds and neck pain.   Eyes: Negative for photophobia and discharge.  Respiratory: Negative for cough, shortness of breath and wheezing.   Cardiovascular: Positive for chest pain. Negative for palpitations.  Gastrointestinal:  Negative for abdominal pain and blood in stool.  Genitourinary: Negative for dysuria, frequency and hematuria.  Musculoskeletal: Positive for arthralgias. Negative for back pain.  Skin: Negative.   Neurological: Negative for dizziness, seizures and speech difficulty.  Psychiatric/Behavioral: Negative for hallucinations and confusion. The patient is nervous/anxious.     Allergies  Ciprofloxacin; Ferrous sulfate; and Sulfa drugs cross reactors  Home Medications   Current Outpatient Rx  Name  Route  Sig  Dispense  Refill  . aspirin 81 MG tablet   Oral   Take 81 mg by mouth daily.           . Calcium Carbonate-Vitamin D (CALCIUM 600+D) 600-400 MG-UNIT per tablet   Oral   Take 1 tablet by mouth daily.           . cholecalciferol (VITAMIN D) 1000 UNITS tablet   Oral   Take 1,000 Units by mouth daily.           Marland Kitchen docusate sodium (COLACE) 100 MG capsule   Oral   Take 100 mg by mouth daily as needed. For constipation          . escitalopram (LEXAPRO) 10 MG tablet   Oral   Take 10 mg by mouth daily.           Marland Kitchen  famotidine (PEPCID) 20 MG tablet   Oral   Take 20 mg by mouth daily.           . Garlic 100 MG TABS   Oral   Take 1 tablet by mouth daily.         Marland Kitchen letrozole (FEMARA) 2.5 MG tablet   Oral   Take 2.5 mg by mouth daily.         Marland Kitchen lisinopril-hydrochlorothiazide (PRINZIDE,ZESTORETIC) 20-25 MG per tablet   Oral   Take 1 tablet by mouth daily.           . Multiple Vitamin (MULTIVITAMIN) tablet   Oral   Take 1 tablet by mouth daily.           . rosuvastatin (CRESTOR) 5 MG tablet   Oral   Take 5 mg by mouth daily.             BP 139/59  Pulse 60  Temp(Src) 98.3 F (36.8 C) (Oral)  Resp 20  SpO2 95%  Physical Exam  Nursing note and vitals reviewed. Constitutional: She is oriented to person, place, and time. She appears well-developed and well-nourished.  Non-toxic appearance.  HENT:  Head: Normocephalic.  Right Ear: Tympanic membrane  and external ear normal.  Left Ear: Tympanic membrane and external ear normal.  Eyes: EOM and lids are normal. Pupils are equal, round, and reactive to light.  Neck: Normal range of motion. Neck supple. Carotid bruit is not present.  Cardiovascular: Normal rate, regular rhythm, normal heart sounds, intact distal pulses and normal pulses.   Pulmonary/Chest: Breath sounds normal. No respiratory distress.  Left mastectomy  Abdominal: Soft. Bowel sounds are normal. There is no tenderness. There is no guarding.  Musculoskeletal: Normal range of motion.  There is good range of motion of the left shoulder. There is no increased redness of the left upper extremity. There is full range of motion of the fingers of the left hand. Full range of motion of the wrist. There is mild puffiness of the left forearm extending to the elbow. The forearm is not hot. The radius area is not hot. The brachial pulse is 2+. The radial pulse is 2+. There no palpable nodes of the biceps triceps area.  Lymphadenopathy:       Head (right side): No submandibular adenopathy present.       Head (left side): No submandibular adenopathy present.    She has no cervical adenopathy.  Neurological: She is alert and oriented to person, place, and time. She has normal strength. No cranial nerve deficit or sensory deficit.  Skin: Skin is warm and dry.  Psychiatric: She has a normal mood and affect. Her speech is normal.    ED Course  Procedures -  Labs Reviewed  CBC WITH DIFFERENTIAL - Abnormal; Notable for the following:    RBC 3.48 (*)    Hemoglobin 11.1 (*)    HCT 32.8 (*)    Eosinophils Relative 8 (*)    All other components within normal limits  BASIC METABOLIC PANEL   No results found.   No diagnosis found.    MDM  I have reviewed nursing notes, vital signs, and all appropriate lab and imaging results for this patient. Patient was diagnosed with breast cancer in 2012, and sustained a left mastectomy with some  removal of lymph nodes in the left axilla. The patient has been doing fine since that time. This morning the patient awakened to find the left arm swollen from  the wrist to the elbow. She did not notice any significant pain, there was no significant redness, no history of previous injury, compression, or needle sticks to the left side.  Vital signs are found to be stable. Complete blood count reveals a hemoglobin of the slightly low at 11.1, and the hematocrit to be slightly low at 32.8, otherwise well within normal limits. Suspect the swelling is related to lymphedema related to cancer surgery. Pt to see PCP or return to ED for evaluation if not improving.      Kathie Dike, PA-C 08/03/12 1002

## 2012-07-30 NOTE — ED Provider Notes (Signed)
Patient seen/examined in the Emergency Department in conjunction with Midlevel Provider [place] Patient reports left UE swelling Exam : no edema/erythema noted.  No signs of trauma.  Distal pulses equal/intact Plan: d/c home.  Does not appear to have acute infectious/neurologic/vascular emergency   Joya Gaskins, MD 07/30/12 [phone]

## 2012-07-30 NOTE — ED Notes (Signed)
Pt states that she started noticing some swelling in her right arm about 2 days ago.  Denies pain in the area.  States that she had a left complete mastectomy about 2 years ago.  States that she has been doing an increased amount of work recently.

## 2012-07-30 NOTE — ED Notes (Signed)
Pt woke this am with left arm swollen, denies any known injury, no pain.

## 2012-08-06 NOTE — ED Provider Notes (Signed)
Medical screening examination/treatment/procedure(s) were conducted as a shared visit with non-physician practitioner(s) and myself.  I personally evaluated the patient during the encounter   Joya Gaskins, MD 08/06/12 [phone]

## 2012-09-13 ENCOUNTER — Other Ambulatory Visit (HOSPITAL_COMMUNITY): Payer: Self-pay | Admitting: Oncology

## 2012-09-13 DIAGNOSIS — Z139 Encounter for screening, unspecified: Secondary | ICD-10-CM

## 2012-09-19 ENCOUNTER — Ambulatory Visit (HOSPITAL_COMMUNITY)
Admission: RE | Admit: 2012-09-19 | Discharge: 2012-09-19 | Disposition: A | Discharge status: Home / Self Care | Payer: Medicare Other | Source: Ambulatory Visit | Attending: Oncology | Admitting: Oncology

## 2012-09-19 DIAGNOSIS — M899 Disorder of bone, unspecified: Secondary | ICD-10-CM | POA: Insufficient documentation

## 2012-09-19 DIAGNOSIS — Z780 Asymptomatic menopausal state: Secondary | ICD-10-CM | POA: Insufficient documentation

## 2012-09-19 DIAGNOSIS — C50919 Malignant neoplasm of unspecified site of unspecified female breast: Secondary | ICD-10-CM

## 2012-09-29 DIAGNOSIS — S6290XA Unspecified fracture of unspecified wrist and hand, initial encounter for closed fracture: Secondary | ICD-10-CM

## 2012-09-29 HISTORY — DX: Unspecified fracture of unspecified wrist and hand, initial encounter for closed fracture: S62.90XA

## 2012-10-11 ENCOUNTER — Encounter (HOSPITAL_COMMUNITY): Payer: Medicare Other | Attending: Hematology and Oncology

## 2012-10-11 DIAGNOSIS — C50919 Malignant neoplasm of unspecified site of unspecified female breast: Secondary | ICD-10-CM

## 2012-10-11 DIAGNOSIS — C50912 Malignant neoplasm of unspecified site of left female breast: Secondary | ICD-10-CM

## 2012-10-11 DIAGNOSIS — Z09 Encounter for follow-up examination after completed treatment for conditions other than malignant neoplasm: Secondary | ICD-10-CM | POA: Insufficient documentation

## 2012-10-11 DIAGNOSIS — Z853 Personal history of malignant neoplasm of breast: Secondary | ICD-10-CM | POA: Insufficient documentation

## 2012-10-11 LAB — CBC WITH DIFFERENTIAL/PLATELET
Basophils Relative: 1 % (ref 0–1)
Eosinophils Absolute: 0.3 10*3/uL (ref 0.0–0.7)
Eosinophils Relative: 8 % — ABNORMAL HIGH (ref 0–5)
Lymphs Abs: 0.8 10*3/uL (ref 0.7–4.0)
MCH: 32.1 pg (ref 26.0–34.0)
MCHC: 33.7 g/dL (ref 30.0–36.0)
MCV: 95.2 fL (ref 78.0–100.0)
Neutrophils Relative %: 62 % (ref 43–77)
Platelets: 165 10*3/uL (ref 150–400)
RDW: 13.7 % (ref 11.5–15.5)

## 2012-10-11 LAB — COMPREHENSIVE METABOLIC PANEL
ALT: 16 U/L (ref 0–35)
Albumin: 3.9 g/dL (ref 3.5–5.2)
Alkaline Phosphatase: 70 U/L (ref 39–117)
Calcium: 10.2 mg/dL (ref 8.4–10.5)
GFR calc Af Amer: 59 mL/min — ABNORMAL LOW (ref >90)
Glucose, Bld: 135 mg/dL — ABNORMAL HIGH (ref 70–99)
Potassium: 4.2 mEq/L (ref 3.5–5.1)
Sodium: 137 mEq/L (ref 135–145)
Total Protein: 7.2 g/dL (ref 6.0–8.3)

## 2012-10-11 LAB — CANCER ANTIGEN 27.29: CA 27.29: 15 U/mL (ref 0–39)

## 2012-10-11 NOTE — Progress Notes (Signed)
Labs drawn today for cbc/diff,cmp,[id number] 

## 2012-10-13 ENCOUNTER — Encounter (HOSPITAL_BASED_OUTPATIENT_CLINIC_OR_DEPARTMENT_OTHER): Payer: Medicare Other | Admitting: Oncology

## 2012-10-13 ENCOUNTER — Encounter (HOSPITAL_COMMUNITY): Payer: Self-pay | Admitting: Oncology

## 2012-10-13 VITALS — BP 113/68 | HR 83 | Temp 98.1°F | Resp 18 | Wt 209.8 lb

## 2012-10-13 DIAGNOSIS — M949 Disorder of cartilage, unspecified: Secondary | ICD-10-CM

## 2012-10-13 DIAGNOSIS — C50919 Malignant neoplasm of unspecified site of unspecified female breast: Secondary | ICD-10-CM

## 2012-10-13 DIAGNOSIS — M899 Disorder of bone, unspecified: Secondary | ICD-10-CM

## 2012-10-13 DIAGNOSIS — M8580 Other specified disorders of bone density and structure, unspecified site: Secondary | ICD-10-CM

## 2012-10-13 DIAGNOSIS — C50912 Malignant neoplasm of unspecified site of left female breast: Secondary | ICD-10-CM

## 2012-10-13 NOTE — Progress Notes (Signed)
SHAH,MINESH, MD No address on file  Invasive ductal carcinoma of left breast  CURRENT THERAPY: Letrozole 2.5 mg daily beginning on 09/19/2010 neoadjuvantly and then restarted in October 2012 following surgery in the adjuvant setting.  INTERVAL HISTORY: Stacy [name] 79 y.o. female returns for  regular  visit for followup of advanced stage IIIA, invasive ductal carcinoma of left breast presenting with a large mass, retraction of the nipple, 6 x 6 cm clinically with suspicious lymph nodes in the left axilla presentation. ER +100%, PR +100%, Ki-67 marker 66%, LV I was seen, but was felt to be grade 1. HER-2/neu was nonamplified. We treated her with neoadjuvant letrozole for several months. She then underwent left modified radical mastectomy on 01/09/2011 but still had a 4.8 cm cancer. Clinically there was reduction in the size of the tumor. 4 of 4 lymph nodes were involved. She then completed radiation therapy in Feb 2013 postoperatively and was restarted on the letrozole.   I personally reviewed and went over laboratory results with the patient.  Her labs were impressive for a minimal anemia of 11.3 g/dL with a normal platelet count and WBC count.  Her metabolic panel was impressive for a minimal dehydration with XBJ:[id number] ratio greater than 20.   Unfortunately, she fell/tripped the other day causing her to fall and fracture her right arm/wrist.  She is present in an imobolizing cast.  She does not require surgical intervention.  She is tolerating Letrozole well without any hot flashes, arthralgias, and myalgias.  She admits that she is compliant with this medication well.   I personally reviewed and went over radiographic studies with the patient.  Her mammogram last year was negative.  She had a bone density in July 2014 which did show osteopenia.  She is on Calcium and Vitamin D.  She was encouraged to continue with these two supplements.  She was provided education regarding osteopenia  and osteoporosis.  She was educated that letrozole is known to cause osteopenia and she was educated to continue with Calcium and Vit D.  We will repeat a bone density test in July 2016 to evaluate for stability.   Oncologically, she denies any complaints and ROS questioning is negative.   She does note some left arm edema and this is secondary to lymphedema.  She was provided education regarding lymph edema .  She has declined lymph edema clinic referral at this time.     Past Medical History  Diagnosis Date  . Diabetes mellitus type II   . Hypertension   . Depression   . Hyperlipemia   . Anxiety   . Anemia   . Cancer   . Fracture of hand 09/29/12    fell and fractured right hand  . Invasive ductal carcinoma of left breast 10/03/2010    has Invasive ductal carcinoma of left breast on her problem list.     is allergic to ciprofloxacin; ferrous sulfate; and sulfa drugs cross reactors.  Ms. Well does not currently have medications on file.  Past Surgical History  Procedure Laterality Date  . Appendectomy    . Cataract extraction w/ intraocular lens implant  08/26/10    Right; [facility]  . Incisional breast biopsy  09/05/10    Left, [facility], [facility]  . Cataract extraction w/phaco  10/14/2010    Procedure: CATARACT EXTRACTION PHACO AND INTRAOCULAR LENS PLACEMENT (IOC);  Surgeon: Loraine Leriche [facility]. [facility];  Location: [facility];  Service: Ophthalmology;  Laterality: Left;  CDE:  8.79  .  Mastectomy      Denies any headaches, dizziness, double vision, fevers, chills, night sweats, nausea, vomiting, diarrhea, constipation, chest pain, heart palpitations, shortness of breath, blood in stool, black tarry stool, urinary pain, urinary burning, urinary frequency, hematuria.   PHYSICAL EXAMINATION  ECOG PERFORMANCE STATUS: 0 - Asymptomatic  Filed Vitals:   10/13/12 1000  BP: 113/68  Pulse: 83  Temp: 98.1 F (36.7 C)  Resp: 18    GENERAL:alert, no distress, well nourished, well developed,  comfortable, cooperative, obese and smiling SKIN: skin color, texture, turgor are normal, no rashes or significant lesions HEAD: Normocephalic, No masses, lesions, tenderness or abnormalities EYES: normal, PERRLA, EOMI, Conjunctiva are pink and non-injected EARS: External ears normal OROPHARYNX:mucous membranes are moist  NECK: supple, no adenopathy, thyroid normal size, non-tender, without nodularity, no stridor, non-tender, trachea midline LYMPH:  no palpable lymphadenopathy BREAST:patient declines to have breast exam LUNGS: clear to auscultation and percussion HEART: regular rate & rhythm, no murmurs, no gallops, S1 normal and S2 normal ABDOMEN:abdomen soft, non-tender, obese and normal bowel sounds BACK: Back symmetric, no curvature. EXTREMITIES:less then 2 second capillary refill, no joint deformities, effusion, or inflammation, no skin discoloration, no clubbing, no cyanosis, positive findings:  edema in left UE secondary to lymphedema. Right lower UE in a cast. NEURO: alert & oriented x 3 with fluent speech, no focal motor/sensory deficits, gait normal    LABORATORY DATA: CBC    Component Value Date/Time   WBC 4.0 10/11/2012 0901   RBC 3.52* 10/11/2012 0901   HGB 11.3* 10/11/2012 0901   HCT 33.5* 10/11/2012 0901   PLT 165 10/11/2012 0901   MCV 95.2 10/11/2012 0901   MCH 32.1 10/11/2012 0901   MCHC 33.7 10/11/2012 0901   RDW 13.7 10/11/2012 0901   LYMPHSABS 0.8 10/11/2012 0901   MONOABS 0.4 10/11/2012 0901   EOSABS 0.3 10/11/2012 0901   BASOSABS 0.0 10/11/2012 0901      Chemistry      Component Value Date/Time   NA 137 10/11/2012 0901   K 4.2 10/11/2012 0901   CL 102 10/11/2012 0901   CO2 25 10/11/2012 0901   BUN 26* 10/11/2012 0901   CREATININE 1.02 10/11/2012 0901      Component Value Date/Time   CALCIUM 10.2 10/11/2012 0901   ALKPHOS 70 10/11/2012 0901   AST 18 10/11/2012 0901   ALT 16 10/11/2012 0901   BILITOT 0.4 10/11/2012 0901        RADIOGRAPHIC  STUDIES:  10/20/2011  *RADIOLOGY REPORT*  Clinical Data: Screening.  MAMMOGRAPHIC UNILATERAL RIGHT DIGITAL SCREENING WITH CAD  Comparison: Previous exams.  Findings: There are scattered fibroglandular densities. No  suspicious masses, architectural distortion, or calcifications are  present.  Images were processed with CAD.  IMPRESSION:  No mammographic evidence of malignancy.  A result letter of this screening mammogram will be mailed directly  to the patient.  RECOMMENDATION:  Screening mammogram in one year. (Code:SM-B-01Y)  BI-RADS CATEGORY 1: Negative.  Original Report Authenticated By: Harrel Lemon, M.D.    PATHOLOGY:  01/08/2013  Diagnosis Breast, modified radical mastectomy , left breast and axillary tissue - INVASIVE AND IN SITU DUCTAL CARCINOMA, 4.8 CM. MARGINS NOT INVOLVED. INVASIVE TUMOR 0.7 CM FROM DEEP MARGIN. - METASTATIC CARCINOMA IN FOUR OF FOUR LYMPH NODES (4/4). Microscopic Comment BREAST, WITH LYMPH NODE SAMPLING Specimen, including laterality: Left breast and axillary tissue Procedure: Mastectomy Tumor size of largest invasive carcinoma (gross measurement or glass slide measurement): 4.8 cm Margins: Invasive component, distance to closest  margin: 0.7 cm from deep margin In situ component, distance to closest margin: 0.8 cm from deep margin Lymph - Vascular invasion: Yes Histologic type, invasive component: Ductal Grade, invasive component (Nottingham combined histologic score): II Tubule formation grade: 2 Nuclear pleomorphism grade: 2 Mitotic grade: 2 Ductal carcinoma in situ: Present Nuclear grade: Intermediate Necrosis: No Extensive intraductal component: No Lobular neoplasia present: No Treatment effect (if treated with neoadjuvant therapy): Yes Multicentric (separate tumors in different quadrants): No Multifocal (separate tumors in same quadrant or biopsy): No Macroscopic or microscopic extent of tumor: Skin: Free of tumor Nipple:  Dermis of nipple is involved by tumor, no pagetoid involvement Skeletal muscle: N/A 1 of 3 FINAL for Bueche, Ota C ([id number]) Microscopic Comment(continued) Axillary lymph nodes: Number examined: 4 Number with metastasis: 4 ITC (isolated tumor cells, < 0.28mm): 0 Micrometastasis (> 0.63mm, < 2mm): 0 Metastasis > 2 mm: 4 Extracapsular extension: Present, focal Method of detection of metastases: H&E, cytokeratin or both: H & E TNM Code: pT2, pN2a Breast Prognostic Markers: Case # [id number] Estrogen receptor: 100%, positive, strong intensity Progesterone receptor: 100%, positive, strong intensity Ki 67 (Mib-1): 66% Her 2 neu by C[facility]: No amplification, ration 1.31. Will be repeated on current specimen Non-neoplastic breast: Unremarkable (JDP:mw 01-12-11) Jimmy Picket MD Pathologist, Electronic Signature (Case signed 01/12/2011)     ASSESSMENT:  1. Advanced stage IIIA, invasive ductal carcinoma of left breast presenting with a large mass, retraction of the nipple, 6 x 6 cm clinically with suspicious lymph nodes in the left axilla presentation. ER +100%, PR +100%, Ki-67 marker 66%, LV I was seen, but was felt to be grade 1. HER-2/neu was nonamplified. We treated her with neoadjuvant letrozole for several months. She then underwent left modified radical mastectomy on 01/09/2011 but still had a 4.8 cm cancer. Clinically there was reduction in the size of the tumor. 4 of 4 lymph nodes were involved. She then completed radiation therapy in Feb 2013 postoperatively and was restarted on the letrozole.  2. History of cataract operation.  3. Mild obesity.  4. Hypercholesterolemia on Crestor.  5. History of gastrointestinal upset on famotidine 20 mg a day.  6. History of hypertension.  7. Mild depression on Lexapro.  8. Appendectomy 1946. 9. Osteopenia, on Calcium and Vitamin D.  Will repeat Bone Density in July 2016 10. Fractured right arm, in cast  Patient Active Problem List   Diagnosis  Date Noted  . Invasive ductal carcinoma of left breast 10/03/2010     PLAN:  1. I personally reviewed and went over laboratory results with the patient. 2. I personally reviewed and went over radiographic studies with the patient. 3. Next screening mammogram is due in August 2014 and this is scheduled for 10/20/2012 4. Bone density shows osteopenia.  This will need to be repeated in July 2016 5. Labs in 3 months: CBC diff, CMET 6. Patient education regarding osteopenia and osteoporosis.  Continue Calcium and Vitamin D 7. Patient declined referral to lymphedema clinic 8. Patient education regarding lymphedema 9. Return in 3 months    THERAPY PLAN:  Oncologically, she is doing well.  She will continue Letrozole for 5 years finishing in October 2017.  Will perform bone density in 2 years.  All questions were answered. The patient knows to call the clinic with any problems, questions or concerns. We can certainly see the patient much sooner if necessary.  Patient and plan discussed with Dr. Erline Hau and he is in agreement with the aforementioned.  KEFALAS,THOMAS

## 2012-10-13 NOTE — Patient Instructions (Addendum)
[facility] Discharge Instructions  RECOMMENDATIONS MADE BY THE CONSULTANT AND ANY TEST RESULTS WILL BE SENT TO YOUR REFERRING PHYSICIAN.  Continue your Letrozole as prescribed. Have your Mammogram in August 2014. We will plan for a Bone Density test again in 2 years. Return to clinic in 3 months for lab work and MD appointment. Report any issues/concerns to clinic as needed prior to appointment.  Thank you for choosing [facility] to provide your oncology and hematology care.  To afford each patient quality time with our providers, please arrive at least 15 minutes before your scheduled appointment time.  With your help, our goal is to use those 15 minutes to complete the necessary work-up to ensure our physicians have the information they need to help with your evaluation and healthcare recommendations.    Effective January 1st, 2014, we ask that you re-schedule your appointment with our physicians should you arrive 10 or more minutes late for your appointment.  We strive to give you quality time with our providers, and arriving late affects you and other patients whose appointments are after yours.    Again, thank you for choosing [facility].  Our hope is that these requests will decrease the amount of time that you wait before being seen by our physicians.       _____________________________________________________________  Should you have questions after your visit to [facility], please contact our office at [phone] between the hours of 8:30 a.m. and 5:00 p.m.  Voicemails left after 4:30 p.m. will not be returned until the following business day.  For prescription refill requests, have your pharmacy contact our office with your prescription refill request.

## 2012-10-20 ENCOUNTER — Ambulatory Visit (HOSPITAL_COMMUNITY)
Admission: RE | Admit: 2012-10-20 | Discharge: 2012-10-20 | Disposition: A | Discharge status: Home / Self Care | Payer: Medicare Other | Source: Ambulatory Visit | Attending: Oncology | Admitting: Oncology

## 2012-10-20 DIAGNOSIS — Z139 Encounter for screening, unspecified: Secondary | ICD-10-CM

## 2012-10-20 DIAGNOSIS — Z1231 Encounter for screening mammogram for malignant neoplasm of breast: Secondary | ICD-10-CM | POA: Insufficient documentation

## 2013-01-11 NOTE — Progress Notes (Signed)
SHAH,MINESH, MD No address on file  Invasive ductal carcinoma of left breast - Plan: letrozole (FEMARA) 2.5 MG tablet  CURRENT THERAPY:Letrozole 2.5 mg daily beginning on 09/19/2010 neoadjuvantly and then restarted in October 2012 following surgery in the adjuvant setting.   INTERVAL HISTORY: Juline Patch 79 y.o. female returns for  regular  visit for followup of advanced stage IIIA, invasive ductal carcinoma of left breast presenting with a large mass, retraction of the nipple, 6 x 6 cm clinically with suspicious lymph nodes in the left axilla presentation. ER +100%, PR +100%, Ki-67 marker 66%, LV I was seen, but was felt to be grade 1. HER-2/neu was nonamplified. We treated her with neoadjuvant letrozole for several months. She then underwent left modified radical mastectomy on 01/09/2011 but still had a 4.8 cm cancer. Clinically there was reduction in the size of the tumor. 4 of 4 lymph nodes were involved. She then completed radiation therapy in Feb 2013 postoperatively and was restarted on the letrozole.   One would argue that this patient requires 5 years worth of AI therapy (beginning October 2012) as the evidence today only recommends 5 years worth of AI in post-menopausal women.  However, another would argue that she had advanced disease and since she is tolerating therapy well, why not continue it lifelong  Since she is at high-risk of relapse.  I will defer to attending oncologist.    Leiliana is doing very well.  She is tolerating Femara without any difficulties.  She denies any excessive hot flashes, arthralgias, and myalgias.  She reports, "i have no complaints."  She has already received her influenza vaccine.  I have discussed the aforementioned italicized paragraph above with her.  For now, I have recommended 5 years worth of therapy, but as time goes by, we will come to a conclusion regarding her future therapy.   Labs were drawn today.   Moving forward, she is due for her  next screening mammogram in August 2015.  I personally reviewed and went over radiographic studies with the patient.  Her last mammogram was BIRADS 1.   Additionally, she will be due for her next bone density in July 2016 since she is on an AI.    NCCN guidelines recommends the following surveillance for invasive breast cancer:  A. History and Physical exam every 4-6 months for 5 years and then every 12 months.  B. Mammography every 12 months  C. Women on Tamoxifen: annual gynecologic assessment every 12 months if uterus is present.  D. Women on aromatase inhibitor or who experience ovarian failure secondary to treatment should have monitoring of bone health with a bone mineral density determination at baseline and periodically thereafter.  E. Assess and encourage adherence to adjuvant endocrine therapy.  F. Evidence suggests that active lifestyle and achieving and maintaining an ideal body weight (20-25 BMI) may lead to optimal breast cancer outcomes.  Oncologically, she denies any complaints and ROS questioning is negative.   Past Medical History  Diagnosis Date  . Diabetes mellitus type II   . Hypertension   . Depression   . Hyperlipemia   . Anxiety   . Anemia   . Cancer   . Fracture of hand 09/29/12    fell and fractured right hand  . Invasive ductal carcinoma of left breast 10/03/2010    has Invasive ductal carcinoma of left breast on her problem list.     is allergic to ciprofloxacin; ferrous sulfate; and sulfa drugs cross  reactors.  Ms. Gallen had no medications administered during this visit.  Past Surgical History  Procedure Laterality Date  . Appendectomy    . Cataract extraction w/ intraocular lens implant  08/26/10    Right; [facility]  . Incisional breast biopsy  09/05/10    Left, [facility], [facility]  . Cataract extraction w/phaco  10/14/2010    Procedure: CATARACT EXTRACTION PHACO AND INTRAOCULAR LENS PLACEMENT (IOC);  Surgeon: Loraine Leriche T. [facility];  Location: [facility];  Service:  Ophthalmology;  Laterality: Left;  CDE:  8.79  . Mastectomy      Denies any headaches, dizziness, double vision, fevers, chills, night sweats, nausea, vomiting, diarrhea, constipation, chest pain, heart palpitations, shortness of breath, blood in stool, black tarry stool, urinary pain, urinary burning, urinary frequency, hematuria.   PHYSICAL EXAMINATION  ECOG PERFORMANCE STATUS: 0 - Asymptomatic  BP 126/75 Weight: 210 lbs T: 97.5 F HR 59  GENERAL:alert, no distress, well nourished, well developed, comfortable, cooperative, obese and smiling SKIN: skin color, texture, turgor are normal, no rashes or significant lesions HEAD: Normocephalic, No masses, lesions, tenderness or abnormalities EYES: normal, PERRLA, EOMI, Conjunctiva are pink and non-injected EARS: External ears normal OROPHARYNX:mucous membranes are moist  NECK: supple, no adenopathy, thyroid normal size, non-tender, without nodularity, no stridor, non-tender, trachea midline LYMPH:  no palpable lymphadenopathy, no hepatosplenomegaly BREAST:right breast normal without mass, skin or nipple changes or axillary nodes, left post-mastectomy site well healed and free of suspicious changes LUNGS: clear to auscultation and percussion HEART: regular rate & rhythm, no murmurs, no gallops, S1 normal and S2 normal ABDOMEN:abdomen soft, non-tender, obese, normal bowel sounds, no masses or organomegaly and no hepatosplenomegaly BACK: Back symmetric, no curvature., No CVA tenderness EXTREMITIES:less then 2 second capillary refill, no joint deformities, effusion, or inflammation, no edema, no skin discoloration, no clubbing, no cyanosis  NEURO: alert & oriented x 3 with fluent speech, no focal motor/sensory deficits, gait normal    LABORATORY DATA: CBC    Component Value Date/Time   WBC 4.0 10/11/2012 0901   RBC 3.52* 10/11/2012 0901   HGB 11.3* 10/11/2012 0901   HCT 33.5* 10/11/2012 0901   PLT 165 10/11/2012 0901   MCV 95.2 10/11/2012  0901   MCH 32.1 10/11/2012 0901   MCHC 33.7 10/11/2012 0901   RDW 13.7 10/11/2012 0901   LYMPHSABS 0.8 10/11/2012 0901   MONOABS 0.4 10/11/2012 0901   EOSABS 0.3 10/11/2012 0901   BASOSABS 0.0 10/11/2012 0901      Chemistry      Component Value Date/Time   NA 137 10/11/2012 0901   K 4.2 10/11/2012 0901   CL 102 10/11/2012 0901   CO2 25 10/11/2012 0901   BUN 26* 10/11/2012 0901   CREATININE 1.02 10/11/2012 0901      Component Value Date/Time   CALCIUM 10.2 10/11/2012 0901   ALKPHOS 70 10/11/2012 0901   AST 18 10/11/2012 0901   ALT 16 10/11/2012 0901   BILITOT 0.4 10/11/2012 0901        RADIOGRAPHIC STUDIES:  10/21/2012  *RADIOLOGY REPORT*  Clinical Data: Screening.  DIGITAL SCREENING UNILATERAL RIGHT MAMMOGRAM WITH CAD  Comparison: Previous exam(s).  FINDINGS:  ACR Breast Density Category b: There are scattered areas of  fibroglandular density.  There are no findings suspicious for malignancy.  Images were processed with CAD.  IMPRESSION:  No mammographic evidence of malignancy.  A result letter of this screening mammogram will be mailed directly  to the patient.  RECOMMENDATION:  Screening mammogram in one  year. (Code:SM-B-01Y)  BI-RADS CATEGORY 1: Negative.  Original Report Authenticated By: Baird Lyons, M.D.    PATHOLOGY:  01/09/2011  Diagnosis  Breast, modified radical mastectomy , left breast and axillary tissue  - INVASIVE AND IN SITU DUCTAL CARCINOMA, 4.8 CM. MARGINS NOT INVOLVED. INVASIVE  TUMOR  0.7 CM FROM DEEP MARGIN.  - METASTATIC CARCINOMA IN FOUR OF FOUR LYMPH NODES (4/4).  Microscopic Comment  BREAST, WITH LYMPH NODE SAMPLING  Specimen, including laterality: Left breast and axillary tissue  Procedure: Mastectomy  Tumor size of largest invasive carcinoma (gross measurement or glass slide measurement): 4.8 cm  Margins:  Invasive component, distance to closest margin: 0.7 cm from deep margin  In situ component, distance to closest margin: 0.8 cm from deep  margin  Lymph - Vascular invasion: Yes  Histologic type, invasive component: Ductal  Grade, invasive component (Nottingham combined histologic score): II  Tubule formation grade: 2  Nuclear pleomorphism grade: 2  Mitotic grade: 2  Ductal carcinoma in situ: Present  Nuclear grade: Intermediate  Necrosis: No  Extensive intraductal component: No  Lobular neoplasia present: No  Treatment effect (if treated with neoadjuvant therapy): Yes  Multicentric (separate tumors in different quadrants): No  Multifocal (separate tumors in same quadrant or biopsy): No  Macroscopic or microscopic extent of tumor:  Skin: Free of tumor  Nipple: Dermis of nipple is involved by tumor, no pagetoid involvement  Skeletal muscle: N/A  1 of 3  FINAL for [place]ukamoto, Shadow C ([id number])  Microscopic Comment(continued)  Axillary lymph nodes:  Number examined: 4  Number with metastasis: 4  ITC (isolated tumor cells, < 0.77mm): 0  Micrometastasis (> 0.81mm, < 2mm): 0  Metastasis > 2 mm: 4  Extracapsular extension: Present, focal  Method of detection of metastases:  H&E, cytokeratin or both: H & E  TNM Code: pT2, pN2a  Breast Prognostic Markers: Case # [id number]  Estrogen receptor: 100%, positive, strong intensity  Progesterone receptor: 100%, positive, strong intensity  Ki 67 (Mib-1): 66%  Her 2 neu by [facility]: No amplification, ration 1.31. Will be repeated on current specimen  Non-neoplastic breast: Unremarkable (JDP:mw 01-12-11)  Jimmy Picket MD  Pathologist, Electronic Signature  (Case signed 01/12/2011)     ASSESSMENT:  1. Advanced stage IIIA, invasive ductal carcinoma of left breast presenting with a large mass, retraction of the nipple, 6 x 6 cm clinically with suspicious lymph nodes in the left axilla presentation. ER +100%, PR +100%, Ki-67 marker 66%, LV I was seen, but was felt to be grade 1. HER-2/neu was nonamplified. We treated her with neoadjuvant letrozole for several months. She then underwent  left modified radical mastectomy on 01/09/2011 but still had a 4.8 cm cancer. Clinically there was reduction in the size of the tumor. 4 of 4 lymph nodes were involved. She then completed radiation therapy in Feb 2013 postoperatively and was restarted on the letrozole.  2. History of cataract operation.  3. Obesity.  4. Hypercholesterolemia on Crestor.  5. History of gastrointestinal upset on famotidine 20 mg a day.  6. History of hypertension.  7. Mild depression on Lexapro.  8. Appendectomy 1946.  9. Osteopenia, on Calcium and Vitamin D. Will repeat Bone Density in July 2016  10. Fractured right arm, resolved  Patient Active Problem List   Diagnosis Date Noted  . Invasive ductal carcinoma of left breast 10/03/2010     PLAN:  1. I personally reviewed and went over laboratory results with the patient. 2. I personally reviewed and went over  radiographic studies with the patient. 3. Next screening mammogram is due in August 2015 4. Next bone density exam is due in July 2016 5. Continue Ca ++ and Vit D. 6. Labs today: CBC diff, CMET 7. Influenza vaccine already given. 8. Return in 3-4 months for follow-up.   THERAPY PLAN:  One would argue that this patient requires 5 years worth of AI therapy (beginning October 2012) as the evidence today only recommends 5 years worth of AI in post-menopausal women.  However, another would argue that she had advanced disease and since she is tolerating therapy well, why not continue it lifelong  Since she is at high-risk of relapse.  I will defer to attending oncologist.  NCCN guidelines recommends the following surveillance for invasive breast cancer:  A. History and Physical exam every 4-6 months for 5 years and then every 12 months.  B. Mammography every 12 months  C. Women on Tamoxifen: annual gynecologic assessment every 12 months if uterus is present.  D. Women on aromatase inhibitor or who experience ovarian failure secondary to treatment should  have monitoring of bone health with a bone mineral density determination at baseline and periodically thereafter.  E. Assess and encourage adherence to adjuvant endocrine therapy.  F. Evidence suggests that active lifestyle and achieving and maintaining an ideal body weight (20-25 BMI) may lead to optimal breast cancer outcomes.  All questions were answered. The patient knows to call the clinic with any problems, questions or concerns. We can certainly see the patient much sooner if necessary.  Patient and plan discussed with Dr. Alla German and he is in agreement with the aforementioned.   Heyward Douthit

## 2013-01-12 ENCOUNTER — Encounter (HOSPITAL_COMMUNITY): Payer: Self-pay | Admitting: Oncology

## 2013-01-12 ENCOUNTER — Encounter (HOSPITAL_COMMUNITY): Payer: Medicare Other | Attending: Oncology | Admitting: Oncology

## 2013-01-12 ENCOUNTER — Encounter (HOSPITAL_BASED_OUTPATIENT_CLINIC_OR_DEPARTMENT_OTHER): Payer: Medicare Other

## 2013-01-12 DIAGNOSIS — C50919 Malignant neoplasm of unspecified site of unspecified female breast: Secondary | ICD-10-CM

## 2013-01-12 DIAGNOSIS — C50912 Malignant neoplasm of unspecified site of left female breast: Secondary | ICD-10-CM

## 2013-01-12 DIAGNOSIS — M8580 Other specified disorders of bone density and structure, unspecified site: Secondary | ICD-10-CM

## 2013-01-12 DIAGNOSIS — E669 Obesity, unspecified: Secondary | ICD-10-CM

## 2013-01-12 DIAGNOSIS — Z09 Encounter for follow-up examination after completed treatment for conditions other than malignant neoplasm: Secondary | ICD-10-CM | POA: Insufficient documentation

## 2013-01-12 DIAGNOSIS — M899 Disorder of bone, unspecified: Secondary | ICD-10-CM

## 2013-01-12 DIAGNOSIS — Z853 Personal history of malignant neoplasm of breast: Secondary | ICD-10-CM | POA: Insufficient documentation

## 2013-01-12 LAB — COMPREHENSIVE METABOLIC PANEL
Alkaline Phosphatase: 71 U/L (ref 39–117)
BUN: 26 mg/dL — ABNORMAL HIGH (ref 6–23)
Chloride: 101 mEq/L (ref 96–112)
Creatinine, Ser: 1.11 mg/dL — ABNORMAL HIGH (ref 0.50–1.10)
GFR calc Af Amer: 53 mL/min — ABNORMAL LOW (ref >90)
GFR calc non Af Amer: 46 mL/min — ABNORMAL LOW (ref >90)
Glucose, Bld: 127 mg/dL — ABNORMAL HIGH (ref 70–99)
Potassium: 4.4 mEq/L (ref 3.5–5.1)
Total Bilirubin: 0.4 mg/dL (ref 0.3–1.2)

## 2013-01-12 LAB — CBC WITH DIFFERENTIAL/PLATELET
HCT: 34.6 % — ABNORMAL LOW (ref 36.0–46.0)
Hemoglobin: 11.5 g/dL — ABNORMAL LOW (ref 12.0–15.0)
Lymphs Abs: 0.8 10*3/uL (ref 0.7–4.0)
MCH: 32.2 pg (ref 26.0–34.0)
Monocytes Absolute: 0.6 10*3/uL (ref 0.1–1.0)
Monocytes Relative: 11 % (ref 3–12)
Neutro Abs: 3.7 10*3/uL (ref 1.7–7.7)
Neutrophils Relative %: 68 % (ref 43–77)
RBC: 3.57 MIL/uL — ABNORMAL LOW (ref 3.87–5.11)

## 2013-01-12 MED ORDER — LETROZOLE 2.5 MG PO TABS: 2.5000 mg | ORAL_TABLET | Freq: Every day | ORAL | Status: DC

## 2013-01-12 NOTE — Patient Instructions (Signed)
[facility] Discharge Instructions  RECOMMENDATIONS MADE BY THE CONSULTANT AND ANY TEST RESULTS WILL BE SENT TO YOUR REFERRING PHYSICIAN.  EXAM FINDINGS BY THE PHYSICIAN TODAY AND SIGNS OR SYMPTOMS TO REPORT TO CLINIC OR PRIMARY PHYSICIAN: Exam and findings as discussed by Dellis Anes, PA-C.  You are doing well.  If any problems with your lab work we will contact you.  Continue the letrozole for 5 full years. Report any new lumps, bone pain, shortness of breath or other symptoms.  MEDICATIONS PRESCRIBED:  Refills for letrozole  INSTRUCTIONS/FOLLOW-UP: Follow-up in 3 - 4 months.  Thank you for choosing [facility] to provide your oncology and hematology care.  To afford each patient quality time with our providers, please arrive at least 15 minutes before your scheduled appointment time.  With your help, our goal is to use those 15 minutes to complete the necessary work-up to ensure our physicians have the information they need to help with your evaluation and healthcare recommendations.    Effective January 1st, 2014, we ask that you re-schedule your appointment with our physicians should you arrive 10 or more minutes late for your appointment.  We strive to give you quality time with our providers, and arriving late affects you and other patients whose appointments are after yours.    Again, thank you for choosing [facility].  Our hope is that these requests will decrease the amount of time that you wait before being seen by our physicians.       _____________________________________________________________  Should you have questions after your visit to [facility], please contact our office at [phone] between the hours of 8:30 a.m. and 5:00 p.m.  Voicemails left after 4:30 p.m. will not be returned until the following business day.  For prescription refill requests, have your pharmacy contact our office with your  prescription refill request.

## 2013-01-12 NOTE — Progress Notes (Signed)
Labs drawn today for cbc/diff,cmp 

## 2013-03-13 ENCOUNTER — Emergency Department (HOSPITAL_COMMUNITY)
Admission: EM | Admit: 2013-03-13 | Discharge: 2013-03-13 | Disposition: A | Discharge status: Home / Self Care | Payer: Medicare Other | Attending: Emergency Medicine | Admitting: Emergency Medicine

## 2013-03-13 ENCOUNTER — Encounter (HOSPITAL_COMMUNITY): Payer: Self-pay | Admitting: Emergency Medicine

## 2013-03-13 DIAGNOSIS — F411 Generalized anxiety disorder: Secondary | ICD-10-CM | POA: Insufficient documentation

## 2013-03-13 DIAGNOSIS — Z87891 Personal history of nicotine dependence: Secondary | ICD-10-CM | POA: Insufficient documentation

## 2013-03-13 DIAGNOSIS — Z9089 Acquired absence of other organs: Secondary | ICD-10-CM | POA: Insufficient documentation

## 2013-03-13 DIAGNOSIS — Z862 Personal history of diseases of the blood and blood-forming organs and certain disorders involving the immune mechanism: Secondary | ICD-10-CM | POA: Insufficient documentation

## 2013-03-13 DIAGNOSIS — Z8781 Personal history of (healed) traumatic fracture: Secondary | ICD-10-CM | POA: Insufficient documentation

## 2013-03-13 DIAGNOSIS — Z7982 Long term (current) use of aspirin: Secondary | ICD-10-CM | POA: Insufficient documentation

## 2013-03-13 DIAGNOSIS — E785 Hyperlipidemia, unspecified: Secondary | ICD-10-CM | POA: Insufficient documentation

## 2013-03-13 DIAGNOSIS — F3289 Other specified depressive episodes: Secondary | ICD-10-CM | POA: Insufficient documentation

## 2013-03-13 DIAGNOSIS — Z79899 Other long term (current) drug therapy: Secondary | ICD-10-CM | POA: Insufficient documentation

## 2013-03-13 DIAGNOSIS — E119 Type 2 diabetes mellitus without complications: Secondary | ICD-10-CM | POA: Insufficient documentation

## 2013-03-13 DIAGNOSIS — F329 Major depressive disorder, single episode, unspecified: Secondary | ICD-10-CM | POA: Insufficient documentation

## 2013-03-13 DIAGNOSIS — J069 Acute upper respiratory infection, unspecified: Secondary | ICD-10-CM | POA: Insufficient documentation

## 2013-03-13 DIAGNOSIS — Z853 Personal history of malignant neoplasm of breast: Secondary | ICD-10-CM | POA: Insufficient documentation

## 2013-03-13 DIAGNOSIS — I10 Essential (primary) hypertension: Secondary | ICD-10-CM | POA: Insufficient documentation

## 2013-03-13 MED ORDER — PREDNISONE 10 MG PO TABS: 20.0000 mg | ORAL_TABLET | Freq: Every day | ORAL | Status: DC

## 2013-03-13 MED ORDER — OXYMETAZOLINE HCL 0.05 % NA SOLN
1.0000 | Freq: Once | NASAL | Status: AC
  Administered 2013-03-13: 1 via NASAL
  Filled 2013-03-13: qty 15

## 2013-03-13 MED ORDER — PREDNISONE 50 MG PO TABS
60.0000 mg | ORAL_TABLET | Freq: Once | ORAL | Status: AC
  Administered 2013-03-13: 60 mg via ORAL
  Filled 2013-03-13 (×2): qty 1

## 2013-03-13 NOTE — ED Notes (Signed)
Pt reports cough and congestion since 12/22, was seen by her pmd on sat and given "shot", pt does not feel any better. Sore throat now from coughing.

## 2013-03-13 NOTE — ED Provider Notes (Signed)
CSN: [id number]     Arrival date & time 03/13/13  0706 History  This chart was scribed for Stacy Quarry, MD by Smiley Houseman, ED Scribe. The patient was seen in room [facility]14[facility]14. Patient's care was started at 7:34 AM.    Chief Complaint  Patient presents with  . Cough  . Nasal Congestion   Patient is a 79 y.o. female presenting with cough. The history is provided by the patient. No language interpreter was used.  Cough Associated symptoms: no chest pain, no chills, no fever, no rash, no rhinorrhea, no shortness of breath and no sore throat    HPI Comments: Stacy THERIEN is a 79 y.o. female who presents to the Emergency Department complaining of a persistent non productive cough, with associated nasal congestion and sinus pain that started about 8 days ago.   Stacy [name] reports Stacy [name] has had some SOB due to the congestion.  Pt denies body aches, ear pain, chills, and fever.  Pt states Stacy [name] went to a doctor in [place], where he gave her a cortisone shot 2 days ago.  Stacy [name] has h/o of breast cancer.  Stacy [name] had a a mastectomy and had radiation, but did not have chemo.  Stacy [name] states Stacy [name] is still taking hormones.  Stacy [name] denies h/o of asthma   Past Medical History  Diagnosis Date  . Diabetes mellitus type II   . Hypertension   . Depression   . Hyperlipemia   . Anxiety   . Anemia   . Cancer   . Fracture of hand 09/29/12    fell and fractured right hand  . Invasive ductal carcinoma of left breast 10/03/2010   Past Surgical History  Procedure Laterality Date  . Appendectomy    . Cataract extraction w/ intraocular lens implant  08/26/10    Right; [facility]  . Incisional breast biopsy  09/05/10    Left, [facility], [facility]  . Cataract extraction w/phaco  10/14/2010    Procedure: CATARACT EXTRACTION PHACO AND INTRAOCULAR LENS PLACEMENT (IOC);  Surgeon: Loraine Leriche T. [facility];  Location: [facility];  Service: Ophthalmology;  Laterality: Left;  CDE:  8.79  . Mastectomy     Family History  Problem Relation Age of Onset  .  Anesthesia problems Neg Hx    History  Substance Use Topics  . Smoking status: Former Games developer  . Smokeless tobacco: Never Used  . Alcohol Use: No   OB History   Grav Para Term Preterm Abortions TAB SAB Ect Mult Living                 Review of Systems  Constitutional: Negative for fever and chills.  HENT: Positive for congestion. Negative for rhinorrhea and sore throat.   Respiratory: Positive for cough. Negative for shortness of breath.   Cardiovascular: Negative for chest pain.  Gastrointestinal: Negative for nausea, vomiting, abdominal pain and diarrhea.  Musculoskeletal: Negative for back pain.  Skin: Negative for color change and rash.  All other systems reviewed and are negative.    Allergies  Ciprofloxacin; Ferrous sulfate; and Sulfa drugs cross reactors  Home Medications   Current Outpatient Rx  Name  Route  Sig  Dispense  Refill  . aspirin 81 MG tablet   Oral   Take 81 mg by mouth daily.           . Calcium Carbonate-Vitamin D (CALCIUM 600+D) 600-400 MG-UNIT per tablet   Oral   Take 1 tablet by mouth daily.           Marland Kitchen  cholecalciferol (VITAMIN D) 1000 UNITS tablet   Oral   Take 1,000 Units by mouth daily.           Marland Kitchen docusate sodium (COLACE) 100 MG capsule   Oral   Take 100 mg by mouth daily as needed. For constipation          . escitalopram (LEXAPRO) 10 MG tablet   Oral   Take 10 mg by mouth daily.           . famotidine (PEPCID) 20 MG tablet   Oral   Take 20 mg by mouth daily.           Marland Kitchen letrozole (FEMARA) 2.5 MG tablet   Oral   Take 1 tablet (2.5 mg total) by mouth daily.   90 tablet   2   . lisinopril-hydrochlorothiazide (PRINZIDE,ZESTORETIC) 20-25 MG per tablet   Oral   Take 1 tablet by mouth daily.           . Multiple Vitamin (MULTIVITAMIN) tablet   Oral   Take 1 tablet by mouth daily.           . predniSONE (DELTASONE) 10 MG tablet   Oral   Take 2 tablets (20 mg total) by mouth daily.   15 tablet   0   .  predniSONE (DELTASONE) 10 MG tablet   Oral   Take 2 tablets (20 mg total) by mouth daily.   15 tablet   0   . rosuvastatin (CRESTOR) 5 MG tablet   Oral   Take 5 mg by mouth daily.            Triage Vitals: BP 144/61  Pulse 73  Temp(Src) 98.3 F (36.8 C) (Oral)  Resp 18  Ht 5\' 3"  (1.6 m)  Wt 200 lb (90.719 kg)  BMI 35.44 kg/m2  SpO2 96% Physical Exam  Nursing note and vitals reviewed. Constitutional: Stacy [name] is oriented to person, place, and time. Stacy [name] appears well-developed and well-nourished. No distress.  HENT:  Head: Normocephalic and atraumatic.  Right Ear: External ear normal.  Left Ear: External ear normal.  Nose: Nose normal.  Mouth/Throat: Oropharynx is clear and moist.  Eyes: Conjunctivae and EOM are normal. Pupils are equal, round, and reactive to light.  Neck: Normal range of motion. No tracheal deviation present.  Cardiovascular: Normal rate, regular rhythm, normal heart sounds and intact distal pulses.   Pulmonary/Chest: Effort normal and breath sounds normal. No respiratory distress. Stacy [name] has no wheezes. Stacy [name] has no rales.  Abdominal: There is no tenderness.  Musculoskeletal: Normal range of motion. Stacy [name] exhibits no edema.  Neurological: Stacy [name] is alert and oriented to person, place, and time. No cranial nerve deficit.  Skin: Skin is warm and dry.  Psychiatric: Stacy [name] has a normal mood and affect. Her behavior is normal. Judgment and thought content normal.    ED Course  Procedures (including critical care time) DIAGNOSTIC STUDIES: Oxygen Saturation is 96% on RA, normal by my interpretation.    COORDINATION OF CARE: 7:39 AM-Patient informed of current plan of treatment and evaluation and agrees with plan.      MDM   1. URI (upper respiratory infection)     Meds ordered this encounter  Medications  . oxymetazoline (AFRIN) 0.05 % nasal spray 1 spray    Sig:   . predniSONE (DELTASONE) tablet 60 mg    Sig:   . predniSONE (DELTASONE) 10 MG tablet    Sig:  Take 2 tablets (20 mg total) by mouth  daily.    Dispense:  15 tablet    Refill:  0  . predniSONE (DELTASONE) 10 MG tablet    Sig: Take 2 tablets (20 mg total) by mouth daily.    Dispense:  15 tablet    Refill:  0     I personally performed the services described in this documentation, which was scribed in my presence. The recorded information has been reviewed and considered.   Stacy Quarry, MD 03/15/13 1051

## 2013-04-19 NOTE — Progress Notes (Signed)
[place], [place] No address on file  Invasive ductal carcinoma of left breast - Plan: CBC with Differential, Basic metabolic panel, Ambulatory referral to Physical Therapy, CBC with Differential, Comprehensive metabolic panel  CURRENT THERAPY:Letrozole 2.5 mg daily beginning on 09/19/2010 neoadjuvantly and then restarted in October 2012 following surgery in the adjuvant setting.   INTERVAL HISTORY: Stacy [name] 80 y.o. female returns for  regular  visit for followup of advanced stage IIIA, invasive ductal carcinoma of left breast presenting with a large mass, retraction of the nipple, 6 x 6 cm clinically with suspicious lymph nodes in the left axilla presentation. ER +100%, PR +100%, Ki-67 marker 66%, LV I was seen, but was felt to be grade 1. HER-2/neu was nonamplified. We treated her with neoadjuvant letrozole for several months. She then underwent left modified radical mastectomy on 01/09/2011 but still had a 4.8 cm cancer. Clinically there was reduction in the size of the tumor. 4 of 4 lymph nodes were involved. She then completed radiation therapy in Feb 2013 postoperatively and was restarted on the letrozole.   One would argue that this patient requires 5 years worth of AI therapy (beginning October 2012) as the evidence today only recommends 5 years worth of AI in post-menopausal women. However, another would argue that she had advanced disease and since she is tolerating therapy well, why not continue it lifelong since she is at high-risk of relapse? I will defer to attending oncologist.   I personally reviewed and went over laboratory results with the patient.  The results are noted within this dictation.  I personally reviewed and went over radiographic studies with the patient.  The results are noted within this dictation.  Mammogram in August 2014 was BIRADS 1 and she will be due for her next screening mammogram in August 2015.  NCCN guidelines recommends the following surveillance  for invasive breast cancer:  A. History and Physical exam every 4-6 months for 5 years and then every 12 months.  B. Mammography every 12 months  C. Women on Tamoxifen: annual gynecologic assessment every 12 months if uterus is present.  D. Women on aromatase inhibitor or who experience ovarian failure secondary to treatment should have monitoring of bone health with a bone mineral density determination at baseline and periodically thereafter.  E. Assess and encourage adherence to adjuvant endocrine therapy.  F. Evidence suggests that active lifestyle and achieving and maintaining an ideal body weight (20-25 BMI) may lead to optimal breast cancer outcomes.   The patient's only complaint is left upper extremity swelling which is clinically relevant. It is noted that her left wrist bracelet is tight. The dorsal aspect of her hand is also a little edematous. On inspection, her left arm is larger than her right arm. She had her breast cancer and surgical resection including lymph node dissection on the left and therefore this is deemed to be lymphedema. I provided patient with education regarding lymphedema and it being a potential side effect from her previous surgery. Sometimes this is a lifelong/chronic issue for breast cancer patients. I recommend a referral to the lymphedema clinic and she is agreeable to this. I defer further evaluation and treatment of lymphedema to the physical therapy department.  Oncologically, the patient denies any complaints ROS questioning is negative. Simply she had a previous bout of viral bronchitis and she continues to have occasional dry cough that she takes an over-the-counter cough suppressant 4. She reports that it is helpful. She denies any sputum  production. She denies any fevers or chills.  Past Medical History  Diagnosis Date  . Diabetes mellitus type II   . Hypertension   . Depression   . Hyperlipemia   . Anxiety   . Anemia   . Cancer   . Fracture of hand  09/29/12    fell and fractured right hand  . Invasive ductal carcinoma of left breast 10/03/2010    has Invasive ductal carcinoma of left breast on her problem list.     is allergic to ciprofloxacin; ferrous sulfate; and sulfa drugs cross reactors.  [name] had no medications administered during this visit.  Past Surgical History  Procedure Laterality Date  . Appendectomy    . Cataract extraction w/ intraocular lens implant  08/26/10    Right; [facility]  . Incisional breast biopsy  09/05/10    Left, [facility], [facility]  . Cataract extraction w/phaco  10/14/2010    Procedure: CATARACT EXTRACTION PHACO AND INTRAOCULAR LENS PLACEMENT (IOC);  Surgeon: Elta Guadeloupe T. [facility];  Location: [facility];  Service: Ophthalmology;  Laterality: Left;  CDE:  8.79  . Mastectomy      Denies any headaches, dizziness, double vision, fevers, chills, night sweats, nausea, vomiting, diarrhea, constipation, chest pain, heart palpitations, shortness of breath, blood in stool, black tarry stool, urinary pain, urinary burning, urinary frequency, hematuria.   PHYSICAL EXAMINATION  ECOG PERFORMANCE STATUS: 0 - Asymptomatic  Filed Vitals:   04/20/13 0951  BP: 130/59  Pulse: 56  Temp: 97.7 F (36.5 C)  Resp: 16    GENERAL:alert, healthy, no distress, well nourished, well developed, comfortable, cooperative, obese and smiling SKIN: skin color, texture, turgor are normal, no rashes or significant lesions HEAD: Normocephalic, No masses, lesions, tenderness or abnormalities EYES: normal, PERRLA, EOMI, Conjunctiva are pink and non-injected EARS: External ears normal OROPHARYNX:mucous membranes are moist  NECK: supple, trachea midline LYMPH:  no palpable lymphadenopathy BREAST:patient declines to have breast exam LUNGS: clear to auscultation and percussion HEART: regular rate & rhythm, no murmurs, no gallops, S1 normal and S2 normal ABDOMEN:abdomen soft BACK: Back symmetric, no curvature. EXTREMITIES:less then 2 second  capillary refill, no joint deformities, effusion, or inflammation, no skin discoloration, no clubbing, no cyanosis, positive findings:  Left upper extremity swelling  NEURO: alert & oriented x 3 with fluent speech, no focal motor/sensory deficits, gait normal    LABORATORY DATA: CBC    Component Value Date/Time   WBC 5.4 01/12/2013 0901   RBC 3.57* 01/12/2013 0901   HGB 11.5* 01/12/2013 0901   HCT 34.6* 01/12/2013 0901   PLT 161 01/12/2013 0901   MCV 96.9 01/12/2013 0901   MCH 32.2 01/12/2013 0901   MCHC 33.2 01/12/2013 0901   RDW 13.4 01/12/2013 0901   LYMPHSABS 0.8 01/12/2013 0901   MONOABS 0.6 01/12/2013 0901   EOSABS 0.3 01/12/2013 0901   BASOSABS 0.0 01/12/2013 0901      Chemistry      Component Value Date/Time   NA 138 01/12/2013 0901   K 4.4 01/12/2013 0901   CL 101 01/12/2013 0901   CO2 25 01/12/2013 0901   BUN 26* 01/12/2013 0901   CREATININE 1.11* 01/12/2013 0901      Component Value Date/Time   CALCIUM 10.1 01/12/2013 0901   ALKPHOS 71 01/12/2013 0901   AST 22 01/12/2013 0901   ALT 19 01/12/2013 0901   BILITOT 0.4 01/12/2013 0901       RADIOGRAPHIC STUDIES:  10/21/2012  *RADIOLOGY REPORT*  Clinical Data: Screening.  DIGITAL SCREENING  UNILATERAL RIGHT MAMMOGRAM WITH CAD  Comparison: Previous exam(s).  FINDINGS:  ACR Breast Density Category b: There are scattered areas of  fibroglandular density.  There are no findings suspicious for malignancy.  Images were processed with CAD.  IMPRESSION:  No mammographic evidence of malignancy.  A result letter of this screening mammogram will be mailed directly  to the patient.  RECOMMENDATION:  Screening mammogram in one year. (Code:SM-B-01Y)  BI-RADS CATEGORY 1: Negative.  Original Report Authenticated By: Lillia Mountain, M.D.    ASSESSMENT:  1. Advanced stage IIIA, invasive ductal carcinoma of left breast presenting with a large mass, retraction of the nipple, 6 x 6 cm clinically with suspicious lymph  nodes in the left axilla presentation. ER +100%, PR +100%, Ki-67 marker 66%, LV I was seen, but was felt to be grade 1. HER-2/neu was nonamplified. We treated her with neoadjuvant letrozole for several months. She then underwent left modified radical mastectomy on 01/09/2011 but still had a 4.8 cm cancer. Clinically there was reduction in the size of the tumor. 4 of 4 lymph nodes were involved. She then completed radiation therapy in Feb 2013 postoperatively and was restarted on the letrozole.  2. History of cataract operation.  3. Obesity.  4. Hypercholesterolemia on Crestor.  5. History of gastrointestinal upset on famotidine 20 mg a day.  6. History of hypertension.  7. Mild depression on Lexapro.  8. Appendectomy 1946.  9. Osteopenia, on Calcium and Vitamin D. Will repeat Bone Density in July 2016  10. Fractured right arm, resolved 11. Left upper extremity lymphedema  Patient Active Problem List   Diagnosis Date Noted  . Invasive ductal carcinoma of left breast 10/03/2010     PLAN:  1. I personally reviewed and went over laboratory results with the patient.  The results are noted within this dictation. 2. I personally reviewed and went over radiographic studies with the patient.  The results are noted within this dictation.   3. Next screening mammogram is due in July 2015 4. Reviewed NCCN guidelines pertaining to surveillance in invasive breast cancer 5. Labs today: CBC diff, CMET 6. Referral to lymphedema clinic. 7. Rx for mastectomy supplies: bras and prostheses 8. Return in 6 months for follow-up   THERAPY PLAN:  NCCN guidelines recommends the following surveillance for invasive breast cancer:  A. History and Physical exam every 4-6 months for 5 years and then every 12 months.  B. Mammography every 12 months  C. Women on Tamoxifen: annual gynecologic assessment every 12 months if uterus is present.  D. Women on aromatase inhibitor or who experience ovarian failure secondary  to treatment should have monitoring of bone health with a bone mineral density determination at baseline and periodically thereafter.  E. Assess and encourage adherence to adjuvant endocrine therapy.  F. Evidence suggests that active lifestyle and achieving and maintaining an ideal body weight (20-25 BMI) may lead to optimal breast cancer outcomes.   All questions were answered. The patient knows to call the clinic with any problems, questions or concerns. We can certainly see the patient much sooner if necessary.  Patient and plan discussed with Dr. Farrel Gobble and he is in agreement with the aforementioned.   KEFALAS,THOMAS

## 2013-04-20 ENCOUNTER — Other Ambulatory Visit (HOSPITAL_COMMUNITY): Payer: Medicare Other

## 2013-04-20 ENCOUNTER — Encounter (HOSPITAL_COMMUNITY): Payer: Medicare Other | Attending: Oncology | Admitting: Oncology

## 2013-04-20 ENCOUNTER — Encounter (HOSPITAL_COMMUNITY): Payer: Self-pay | Admitting: Oncology

## 2013-04-20 VITALS — BP 130/59 | HR 56 | Temp 97.7°F | Resp 16

## 2013-04-20 DIAGNOSIS — C50912 Malignant neoplasm of unspecified site of left female breast: Secondary | ICD-10-CM

## 2013-04-20 DIAGNOSIS — C50919 Malignant neoplasm of unspecified site of unspecified female breast: Secondary | ICD-10-CM | POA: Insufficient documentation

## 2013-04-20 DIAGNOSIS — C50219 Malignant neoplasm of upper-inner quadrant of unspecified female breast: Secondary | ICD-10-CM

## 2013-04-20 DIAGNOSIS — Z170 Estrogen receptor positive status [ER+]: Secondary | ICD-10-CM

## 2013-04-20 DIAGNOSIS — M949 Disorder of cartilage, unspecified: Secondary | ICD-10-CM

## 2013-04-20 DIAGNOSIS — C773 Secondary and unspecified malignant neoplasm of axilla and upper limb lymph nodes: Secondary | ICD-10-CM

## 2013-04-20 DIAGNOSIS — M899 Disorder of bone, unspecified: Secondary | ICD-10-CM

## 2013-04-20 DIAGNOSIS — R599 Enlarged lymph nodes, unspecified: Secondary | ICD-10-CM

## 2013-04-20 NOTE — Patient Instructions (Signed)
[facility] Discharge Instructions  RECOMMENDATIONS MADE BY THE CONSULTANT AND ANY TEST RESULTS WILL BE SENT TO YOUR REFERRING PHYSICIAN.  EXAM FINDINGS BY THE PHYSICIAN TODAY AND SIGNS OR SYMPTOMS TO REPORT TO CLINIC OR PRIMARY PHYSICIAN: Exam and findings as discussed by T. Kefalas, PA-C.  INSTRUCTIONS/FOLLOW-UP: 1.  You have been referred to the lymphedema clinic.  If not today, you will get a phone call with appointment date/time details. 2.  Please be sure to have your mammogram in July. 3.  Return in 6 months for labs and an office visit as scheduled.  Sooner if questions or concerns.  Thank you for choosing [facility] to provide your oncology and hematology care.  To afford each patient quality time with our providers, please arrive at least 15 minutes before your scheduled appointment time.  With your help, our goal is to use those 15 minutes to complete the necessary work-up to ensure our physicians have the information they need to help with your evaluation and healthcare recommendations.    Effective January 1st, 2014, we ask that you re-schedule your appointment with our physicians should you arrive 10 or more minutes late for your appointment.  We strive to give you quality time with our providers, and arriving late affects you and other patients whose appointments are after yours.    Again, thank you for choosing [facility].  Our hope is that these requests will decrease the amount of time that you wait before being seen by our physicians.       _____________________________________________________________  Should you have questions after your visit to [facility], please contact our office at [phone] between the hours of 8:30 a.m. and 5:00 p.m.  Voicemails left after 4:30 p.m. will not be returned until the following business day.  For prescription refill requests, have your pharmacy contact our office with your  prescription refill request.

## 2013-05-01 ENCOUNTER — Ambulatory Visit (HOSPITAL_COMMUNITY): Payer: Medicare Other | Admitting: Physical Therapy

## 2013-05-08 ENCOUNTER — Ambulatory Visit (HOSPITAL_COMMUNITY)
Admission: RE | Admit: 2013-05-08 | Discharge: 2013-05-08 | Disposition: A | Discharge status: Home / Self Care | Payer: Medicare Other | Source: Ambulatory Visit | Attending: Oncology | Admitting: Oncology

## 2013-05-08 DIAGNOSIS — E785 Hyperlipidemia, unspecified: Secondary | ICD-10-CM | POA: Insufficient documentation

## 2013-05-08 DIAGNOSIS — E119 Type 2 diabetes mellitus without complications: Secondary | ICD-10-CM | POA: Insufficient documentation

## 2013-05-08 DIAGNOSIS — IMO0001 Reserved for inherently not codable concepts without codable children: Secondary | ICD-10-CM | POA: Insufficient documentation

## 2013-05-08 DIAGNOSIS — Z9221 Personal history of antineoplastic chemotherapy: Secondary | ICD-10-CM | POA: Insufficient documentation

## 2013-05-08 DIAGNOSIS — Z853 Personal history of malignant neoplasm of breast: Secondary | ICD-10-CM | POA: Insufficient documentation

## 2013-05-08 DIAGNOSIS — Z923 Personal history of irradiation: Secondary | ICD-10-CM | POA: Insufficient documentation

## 2013-05-08 DIAGNOSIS — F3289 Other specified depressive episodes: Secondary | ICD-10-CM | POA: Insufficient documentation

## 2013-05-08 DIAGNOSIS — F329 Major depressive disorder, single episode, unspecified: Secondary | ICD-10-CM | POA: Insufficient documentation

## 2013-05-08 DIAGNOSIS — F411 Generalized anxiety disorder: Secondary | ICD-10-CM | POA: Insufficient documentation

## 2013-05-08 DIAGNOSIS — I890 Lymphedema, not elsewhere classified: Secondary | ICD-10-CM | POA: Insufficient documentation

## 2013-05-08 DIAGNOSIS — I10 Essential (primary) hypertension: Secondary | ICD-10-CM | POA: Insufficient documentation

## 2013-05-08 NOTE — Evaluation (Signed)
Physical Therapy Evaluation  Patient Details  Name: Stacy [name] MRN: [id number] Date of Birth: [birth year]  Today's Date: 05/08/2013 Time: 1308-1350 PT Time Calculation (min): 42 min Charge:  evaluation             Visit#: 1 of 12  Re-eval: 06/07/13 Assessment Diagnosis: lymphedema Prior Therapy: none  Authorization: medicare      Past Medical History:  Past Medical History  Diagnosis Date  . Diabetes mellitus type II   . Hypertension   . Depression   . Hyperlipemia   . Anxiety   . Anemia   . Cancer   . Fracture of hand 09/29/12    fell and fractured right hand  . Invasive ductal carcinoma of left breast 10/03/2010   Past Surgical History:  Past Surgical History  Procedure Laterality Date  . Appendectomy    . Cataract extraction w/ intraocular lens implant  08/26/10    Right; [facility]  . Incisional breast biopsy  09/05/10    Left, [facility], [facility]  . Cataract extraction w/phaco  10/14/2010    Procedure: CATARACT EXTRACTION PHACO AND INTRAOCULAR LENS PLACEMENT (IOC);  Surgeon: Elta Guadeloupe T. [facility];  Location: [facility];  Service: Ophthalmology;  Laterality: Left;  CDE:  8.79  . Mastectomy      Subjective Symptoms/Limitations Symptoms: Stacy [name] states that she has noticed since September that her Lt arm becomes bigger than her Right.  She has been sick for a week and has not been using her arm therefore her arm is down quite a bit at this time. Pertinent History: Stacy [name] is a 80 y.o. female who was diagnosed with  advanced stage IIIA, invasive ductal carcinoma of left breast presenting with a large mass, retraction of the nipple, 6 x 6 cm clinically with suspicious lymph nodes in the left axilla presentation. She was  treated  with neoadjuvant letrozole for several months. She then underwent left modified radical mastectomy on 01/09/2011 but still had a 4.8 cm cancer. Clinically there was reduction in the size of the tumor. 4 of 4 lymph nodes were involved. She then completed  radiation therapy in Feb 2013 postoperatively and was restarted on the letrozole.     Cognition/Observation Observation/Other Assessments Observations: volumes Rt UE 3156.219 cc; Lt 3625.615 cc       Manual Therapy Manual Therapy: Other (comment) Other Manual Therapy: Pt received manual decongestive techniques including supraclavicular; superficial and deep abdominal; Anterior Lt to Rt intraaxillary and Lt axillary/inguinal anastomosis followed by star technique.   Physical Therapy Assessment and Plan PT Assessment and Plan Clinical Impression Statement: Stacy [name] is s/p masectomy with both chemo and radiation.  She has noted increased swelling in her Lt arm since September.  She states volume is down significantly due to her being ill for a week and not using her UE.  The patient is referred to therapy for Complete decongestive therapy and will benefit from skilled care to achieve this. Prescription for an arm sleeve has been faxed to MD. Pt has been given the bandages that she will need to allow the bandaging phase of the treattment. Pt will benefit from skilled therapeutic intervention in order to improve on the following deficits: Impaired UE functional use;Increased edema Rehab Potential: Good PT Frequency: Min 3X/week PT Duration: 4 weeks PT Treatment/Interventions: Manual techniques PT Plan: cut foam and begin bandaging whenever pt aquires bandages.  Begin manual decongestive techniques immediately.      Goals Home Exercise Program Pt/caregiver will Perform Home  Exercise Program:  (for decreased volumes) PT Goal: Perform Home Exercise Program - Progress: Goal set today PT Short Term Goals Time to Complete Short Term Goals: 2 weeks PT Short Term Goal 1: reduce volume by 40% PT Short Term Goal 2: I in skin care and care of bandages PT Short Term Goal 3: Pt to be able to verbalize precautions for decreasing risk of infection PT Short Term Goal 4: I in HEP to promote lymphatic  circulation. PT Long Term Goals Time to Complete Long Term Goals: 4 weeks PT Long Term Goal 1: volume decreased by 80% PT Long Term Goal 2: Understand where and how to be fitted for a compression garment.  Problem List Patient Active Problem List   Diagnosis Date Noted  . Lymphedema 05/08/2013  . Invasive ductal carcinoma of left breast 10/03/2010    General Behavior During Therapy: [facility] for tasks assessed/performed PT Plan of Care PT Home Exercise Plan: given   GP Functional Assessment Tool Used: clinical judgement Functional Limitation: Other PT primary Other PT Primary Current Status (O5366): At least 20 percent but less than 40 percent impaired, limited or restricted Other PT Primary Goal Status (Y4034): At least 1 percent but less than 20 percent impaired, limited or restricted  Magdelena Kinsella,CINDY 05/08/2013, 3:15 PM  Physician Documentation Your signature is required to indicate approval of the treatment plan as stated above.  Please sign and either send electronically or make a copy of this report for your files and return this physician signed original.   Please mark one 1.__approve of plan  2. ___approve of plan with the following conditions.   ______________________________                                                          _____________________ Physician Signature                                                                                                             Date

## 2013-05-11 ENCOUNTER — Ambulatory Visit (HOSPITAL_COMMUNITY): Payer: Medicare Other | Admitting: Physical Therapy

## 2013-05-15 ENCOUNTER — Ambulatory Visit (HOSPITAL_COMMUNITY): Payer: Medicare Other | Admitting: Physical Therapy

## 2013-05-17 ENCOUNTER — Ambulatory Visit (HOSPITAL_COMMUNITY): Payer: Medicare Other | Admitting: Physical Therapy

## 2013-05-19 ENCOUNTER — Ambulatory Visit (HOSPITAL_COMMUNITY): Payer: Medicare Other | Admitting: Physical Therapy

## 2013-05-22 ENCOUNTER — Ambulatory Visit (HOSPITAL_COMMUNITY): Payer: Medicare Other | Admitting: Physical Therapy

## 2013-05-24 ENCOUNTER — Ambulatory Visit (HOSPITAL_COMMUNITY): Payer: Medicare Other | Admitting: Physical Therapy

## 2013-05-29 ENCOUNTER — Ambulatory Visit (HOSPITAL_COMMUNITY): Payer: Medicare Other | Admitting: Physical Therapy

## 2013-05-31 ENCOUNTER — Ambulatory Visit (HOSPITAL_COMMUNITY): Payer: Medicare Other | Admitting: Physical Therapy

## 2013-09-01 ENCOUNTER — Other Ambulatory Visit (HOSPITAL_COMMUNITY): Payer: Self-pay | Admitting: Oncology

## 2013-09-25 ENCOUNTER — Ambulatory Visit (HOSPITAL_COMMUNITY)
Admission: RE | Admit: 2013-09-25 | Discharge: 2013-09-25 | Disposition: A | Discharge status: Home / Self Care | Payer: Medicare Other | Source: Ambulatory Visit | Attending: Oncology | Admitting: Oncology

## 2013-09-25 DIAGNOSIS — I890 Lymphedema, not elsewhere classified: Secondary | ICD-10-CM | POA: Diagnosis present

## 2013-09-25 DIAGNOSIS — IMO0001 Reserved for inherently not codable concepts without codable children: Secondary | ICD-10-CM | POA: Insufficient documentation

## 2013-09-25 DIAGNOSIS — I972 Postmastectomy lymphedema syndrome: Secondary | ICD-10-CM | POA: Diagnosis not present

## 2013-09-25 NOTE — Evaluation (Signed)
Physical Therapy Evaluation  Patient Details  Name: Stacy [name] MRN: [id number] Date of Birth: 04/17/1931  Today's Date: 09/25/2013 Time: 1345-1430 PT Time Calculation (min): 45 min Charge: manual [id number]:  Manual 1415-1430            Visit#: 1 of 12  Re-eval: 10/25/13    Past Medical History:  Past Medical History  Diagnosis Date  . Diabetes mellitus type II   . Hypertension   . Depression   . Hyperlipemia   . Anxiety   . Anemia   . Cancer   . Fracture of hand 09/29/12    fell and fractured right hand  . Invasive ductal carcinoma of left breast 10/03/2010   Past Surgical History:  Past Surgical History  Procedure Laterality Date  . Appendectomy    . Cataract extraction w/ intraocular lens implant  08/26/10    Right; [facility]  . Incisional breast biopsy  09/05/10    Left, [facility], [facility]  . Cataract extraction w/phaco  10/14/2010    Procedure: CATARACT EXTRACTION PHACO AND INTRAOCULAR LENS PLACEMENT (IOC);  Surgeon: Elta Guadeloupe T. [facility];  Location: [facility];  Service: Ophthalmology;  Laterality: Left;  CDE:  8.79  . Mastectomy      Subjective Symptoms/Limitations Symptoms: [name] was seen for an evaluation only in Feburary of this year.  She states that she did not return secondary to becoming ill and the illness continued on and off for a month.  Her left arm has remained swollen therefore when she went for a follow up visit to her doctor she asked if she could be referred to therapy to begin manual decongestve techniques.  The patient states that she has been havig swelling since Septemeber of 2014; her swelling does not seem to get worse but it is not getting better either.  She has been referred to therapy for manual decongestive techniques.  Pertinent History: Stacy [name] is a 80 y.o. female who was diagnosed with  advanced stage IIIA, invasive ductal carcinoma of left breast in 2013.  She presented with a large mass, retraction of the nipple, 6 x 6 cm clinically with  suspicious lymph nodes in the left axilla . She was  treated  with neoadjuvant letrozole for several months. She then underwent left modified radical mastectomy on 01/09/2011 but still had a 4.8 cm cancer. Clinically there was reduction in the size of the tumor. 4 of 4 lymph nodes were involved. She then completed radiation therapy in Feb 2013 postoperatively and was restarted on the letrozole.  Pain Assessment Currently in Pain?: No/denies Balance Screening  no falls   Assessment    Date 09/25/2013 09/25/2013   Right Left  MCP 21.00 21.50  WRIST 18.7 21  4cm 18.80 23.30  8cm 21.20 26.00  12 cm 24.80 29.70  16cm 27.50 31.00  20cm 29.00 32.40  24cm 29.00 32.60  28cm 31.00 32.30  32cm 31.00 33.60  36cm 31.80 35.30  40cm 33.50 37.10                              Sum of squares 8702.35 10872.50  Total Volume 2770.045 8921.1941         Exercise/Treatments  Manual Therapy Manual Therapy: Other (comment) Other Manual Therapy: Pt recieved manual decongestive techniques to the anterior aspect only secondary to time restraints.  Manual included supraclavicular, superfiscial and deep abdominal followed by routing fluid from Lt to Rt axillary and  from Lt axillary to Lt inguinal area followed by manual techniques to anterior aspect of LT UE.  Physical Therapy Assessment and Plan PT Assessment and Plan Clinical Impression Statement: Pt is a 80 yo female who has undergone radiation and hormonal therapy for her breast cancer.  The patient has had lymphedema in her Lt UE for ten months and is currently being referred for manual techniques to decrese and contain the edema.  Ms. Beavers will benefit from skilled PT to improve her quality of life. Rehab Potential: Good PT Frequency: Min 3X/week PT Duration: 4 weeks PT Treatment/Interventions: Manual techniques PT Plan: Pt to recieve total decongestive manual techniques including wrapping with short stretch bandages and foam.  Therapist to  review why pt with lymphedema are suseptible to cellulitis and why it is so important to treat as quickly as possible.  Pt is upable to lie on her stomach therefore posterior aspect will be done sidelying.     Goals PT Short Term Goals Time to Complete Short Term Goals: 2 weeks PT Short Term Goal 1: Pt to be I in skin and bandage care. PT Short Term Goal 2: Pt volume to be reduced by 30%  PT Short Term Goal 3: Pt to be I in recognizing warning signs and appropriate response to possible infection  PT Long Term Goals PT Long Term Goal 1: decreased volume by 70% PT Long Term Goal 2: Pt to be able to verbalize the maintenance phase of treatment.   Long Term Goal 3: Pt to be able to verbalize how and where to be fitted for a compresion garment.  Long Term Goal 4: LPt go be I with night time compression   Problem List Patient Active Problem List   Diagnosis Date Noted  . Lymphedema 05/08/2013  . Invasive ductal carcinoma of left breast 10/03/2010    PT Plan of Care PT Home Exercise Plan: information of lymphedema and self massage has been given  GP Functional Assessment Tool Used: clinical judgement Functional Limitation: Other PT primary Other PT Primary Current Status (R4163): At least 20 percent but less than 40 percent impaired, limited or restricted Other PT Primary Goal Status (A4536): At least 1 percent but less than 20 percent impaired, limited or restricted  [name],CINDY 09/25/2013, 3:11 PM  Physician Documentation Your signature is required to indicate approval of the treatment plan as stated above.  Please sign and either send electronically or make a copy of this report for your files and return this physician signed original.   Please mark one 1.__approve of plan  2. ___approve of plan with the following conditions.   ______________________________                                                          _____________________ Physician Signature  Date  

## 2013-09-28 ENCOUNTER — Ambulatory Visit (HOSPITAL_COMMUNITY)
Admission: RE | Admit: 2013-09-28 | Discharge: 2013-09-28 | Disposition: A | Discharge status: Home / Self Care | Payer: Medicare Other | Source: Ambulatory Visit | Attending: Oncology | Admitting: Oncology

## 2013-09-28 DIAGNOSIS — IMO0001 Reserved for inherently not codable concepts without codable children: Secondary | ICD-10-CM | POA: Diagnosis not present

## 2013-09-28 NOTE — Progress Notes (Signed)
Physical Therapy Treatment Patient Details  Name: Stacy [name] MRN: [id number] Date of Birth: [birth year]  Today's Date: 09/28/2013 Time: 6948-5462 PT Time Calculation (min): 90 min Charge: manual 1110-1230 Visit#: 2 of 12   Subjective: Symptoms/Limitations Symptoms: Pt has no complaints other than UE being swollen.  Pt comes to department with bandages in hand.  Exercise/Treatments     Manual Therapy Other Manual Therapy: Pt recieved manual decongestive techniques including supraclavicular, deep and superfical abdominal, routing fluid anteriorly and posteriorly from Lt axillary to Rt axillary and Lt inguial nodes as well as anterior and posterior aspect of Lt UE.  Pt posterior aspect was done side lying as pt can not toerate prone.  Foam was cut; pt was then wrapped using foam and multilayer short stretch bandaging.   Physical Therapy Assessment and Plan PT Assessment and Plan Clinical Impression Statement: Pt had foam cut to arm recived instruction after wrapping to take wrapping off if any discomfort or color changes in nail bed. Pt instrcuted to begin abdominal breathing for five minutes prior to bedtime.  Pt is to take bandages off Saturday or Sunday to shower.  PT Plan: continue with complete decongestive therapy techniques     Goals  progressing  Problem List Patient Active Problem List   Diagnosis Date Noted  . Lymphedema 05/08/2013  . Invasive ductal carcinoma of left breast 10/03/2010       GP    Ashaunti Treptow,CINDY 09/28/2013, 5:27 PM

## 2013-10-02 ENCOUNTER — Ambulatory Visit (HOSPITAL_COMMUNITY)
Admission: RE | Admit: 2013-10-02 | Discharge: 2013-10-02 | Disposition: A | Discharge status: Home / Self Care | Payer: Medicare Other | Source: Ambulatory Visit | Attending: Oncology | Admitting: Oncology

## 2013-10-02 DIAGNOSIS — IMO0001 Reserved for inherently not codable concepts without codable children: Secondary | ICD-10-CM | POA: Diagnosis not present

## 2013-10-02 NOTE — Progress Notes (Signed)
Physical Therapy Treatment Patient Details  Name: ILIYANA CONVEY MRN: [id number] Date of Birth: 1932/02/27  Today's Date: 10/02/2013 Time: 1122-1200 PT Time Calculation (min): 38 min Visit#: 3 of 12  Charges:  Manual 38  Subjective: Symptoms/Limitations Symptoms: Pt states she had to remove her bandages as her fingers and hand began to go numb.  Currently without complaints. Pain Assessment Currently in Pain?: No/denies    Objective: Manual Therapy Manual Therapy: Other (comment) Other Manual Therapy: Pt received manual decongestive techniques including short neck, deep and superficial abdominal followed by routing fluid via Lt axillo-inguinal pathway and Lt to Rt supraclavicular pathway.  Lt UE was then wrapped with 1/2" foam and multilayer short stretch bandages from base of MC to shoulder.  Fingers were also bandaged.  Physical Therapy Assessment and Plan PT Assessment and Plan Clinical Impression Statement: Pt complaint with her abdominal breathing.  Encouraged patient to keep bandages intact until appoinment Wednesday morining.  Pt verbalized understanding (unless noted irritation or circulation problems).   PT Plan: continue with complete decongestive therapy techniques.  Measure next visit.     Problem List Patient Active Problem List   Diagnosis Date Noted  . Lymphedema 05/08/2013  . Invasive ductal carcinoma of left breast 10/03/2010    Teena Irani, PTA/CLT 10/02/2013, 3:45 PM

## 2013-10-04 ENCOUNTER — Ambulatory Visit (HOSPITAL_COMMUNITY)
Admission: RE | Admit: 2013-10-04 | Discharge: 2013-10-04 | Disposition: A | Discharge status: Home / Self Care | Payer: Medicare Other | Source: Ambulatory Visit | Attending: Oncology | Admitting: Oncology

## 2013-10-04 DIAGNOSIS — IMO0001 Reserved for inherently not codable concepts without codable children: Secondary | ICD-10-CM | POA: Diagnosis not present

## 2013-10-04 NOTE — Progress Notes (Signed)
Physical Therapy Treatment Patient Details  Name: Stacy [name] MRN: [id number] Date of Birth: 1931-11-18  Today's Date: 10/04/2013 Time: 1108-1200 PT Time Calculation (min): 52 min Visit#: 4 of 12   Charges:  Manual 48  Subjective: Symptoms/Limitations Symptoms: Pt states she was able to keep her bandages on until she went to bed that night.  STates most tightness was in forearm area. Pain Assessment Currently in Pain?: No/denies   Objective:  Date  09/25/2013  10/04/2013    left Left   MCP  21.5 21.50   WRIST  21 20.20   4cm  23.30 23.50   8cm  26.00 24.60   12 cm  29.70 27.60   16cm  31.00 29.00   20cm  32.40 30  24cm  32.60 28.50   28cm  32.30 31   32cm  33.60 32   36cm  35.30 34   40cm  37.10 35.50           Sum of squares  10872.50 9743.96   Total Volume  3460.83 3101.60          Manual Therapy Manual Therapy: Other (comment) Other Manual Therapy: Pt received manual decongestive techniques including short neck, deep and superficial abdominal followed by routing fluid via Lt axillo-inguinal pathway and Lt to Rt supraclavicular pathway. Lt UE was then wrapped with 1/2" foam and multilayer short stretch bandages from base of MC to shoulder. Fingers were also bandaged  Physical Therapy Assessment and Plan PT Assessment and Plan Clinical Impression Statement: Re-measured Lt UE today for decompression.  Overall loss of 359.23 cc since initial evaluation on 7/13.  Completed manual lymph drainage and rebandaged entire Lt UE including fingers.  Pt encouraged to keep on until returns for appointment tomorrow.   PT Plan: continue with complete decongestive therapy techniques.  Measure weekly for progress (wednedsdays)     Problem List Patient Active Problem List   Diagnosis Date Noted  . Lymphedema 05/08/2013  . Invasive ductal carcinoma of left breast 10/03/2010      Teena Irani, PTA/CLT 10/04/2013, 1:22 PM

## 2013-10-05 ENCOUNTER — Ambulatory Visit (HOSPITAL_COMMUNITY)
Admission: RE | Admit: 2013-10-05 | Discharge: 2013-10-05 | Disposition: A | Discharge status: Home / Self Care | Payer: Medicare Other | Source: Ambulatory Visit | Attending: Oncology | Admitting: Oncology

## 2013-10-05 DIAGNOSIS — IMO0001 Reserved for inherently not codable concepts without codable children: Secondary | ICD-10-CM | POA: Diagnosis not present

## 2013-10-05 NOTE — Progress Notes (Signed)
Physical Therapy Treatment Patient Details  Name: Stacy [name] MRN: [id number] Date of Birth: Sep 21, 1931  Today's Date: 10/05/2013 Time: 1015-1100 PT Time Calculation (min): 45 min Charge:  Manual 1015-1100 Visit#: 5 of 12  Re-eval: 10/25/13    Subjective: Symptoms/Limitations Symptoms: Pt states that she is pleased with her progress.  Exercise/Treatments   Manual Therapy Other Manual Therapy: Pt received manual decongestive techniques including short neck, deep and superficial abdominal followed by routing fluid via Lt axillo-inguinal pathway and Lt to Rt supraclavicular pathway. Lt UE was then wrapped with 1/2" foam and multilayer short stretch bandages from base of MC to shoulder. Fingers were also bandaged  Physical Therapy Assessment and Plan PT Assessment and Plan Clinical Impression Statement: Pt with decreased congestion in hand and forearm today. PT Plan: continue with complete decongestive therapy techniques.  Measure weekly for progress (wednedsdays)        Problem List Patient Active Problem List   Diagnosis Date Noted  . Lymphedema 05/08/2013  . Invasive ductal carcinoma of left breast 10/03/2010       GP    Stacy [name],CINDY 10/05/2013, 5:40 PM

## 2013-10-09 ENCOUNTER — Ambulatory Visit (HOSPITAL_COMMUNITY)
Admission: RE | Admit: 2013-10-09 | Discharge: 2013-10-09 | Disposition: A | Discharge status: Home / Self Care | Payer: Medicare Other | Source: Ambulatory Visit | Attending: Oncology | Admitting: Oncology

## 2013-10-09 DIAGNOSIS — IMO0001 Reserved for inherently not codable concepts without codable children: Secondary | ICD-10-CM | POA: Diagnosis not present

## 2013-10-09 NOTE — Progress Notes (Signed)
Physical Therapy Treatment Patient Details  Name: Stacy [name] MRN: [id number] Date of Birth: [birth year]  Today's Date: 10/09/2013 Time: 1020-1110 PT Time Calculation (min): 50 min  Visit#: 6 of 12  Re-eval: 10/25/13 Authorization: medicare  Authorization Visit#: 6 of 10  Charges:  Manual 48  Subjective: Symptoms/Limitations Symptoms: Pt states she can tell a big improvement in her Lt UE.  States she's working on it every chance she gets.    Objective: Manual Therapy Manual Therapy: Other (comment) Other Manual Therapy: Pt received manual decongestive techniques including short neck, deep and superficial abdominal followed by routing fluid via Lt axillo-inguinal pathway and Lt to Rt supraclavicular pathway. Lt UE was then wrapped with 1/2" foam and multilayer short stretch bandages from base of MC to shoulder. Fingers were also bandagedt  Physical Therapy Assessment and Plan PT Assessment and Plan Clinical Impression Statement: Spoke with patient today regarding fitting for compression sleeve.  Explained procedure and also discussed other means of compression, such as night sleeve and compression pumps.  Referral sent to [place] and will then send to [place]: continue with complete decongestive therapy techniques.  Measure next visit (wednesday).  Await orders for compression garments.     Problem List Patient Active Problem List   Diagnosis Date Noted  . Lymphedema 05/08/2013  . Invasive ductal carcinoma of left breast 10/03/2010      Teena Irani, PTA/CLT 10/09/2013, 2:20 PM

## 2013-10-11 ENCOUNTER — Ambulatory Visit (HOSPITAL_COMMUNITY)
Admission: RE | Admit: 2013-10-11 | Discharge: 2013-10-11 | Disposition: A | Discharge status: Home / Self Care | Payer: Medicare Other | Source: Ambulatory Visit | Attending: Oncology | Admitting: Oncology

## 2013-10-11 DIAGNOSIS — IMO0001 Reserved for inherently not codable concepts without codable children: Secondary | ICD-10-CM | POA: Diagnosis not present

## 2013-10-11 NOTE — Progress Notes (Signed)
Physical Therapy Treatment Patient Details  Name: Stacy [name] MRN: [id number] Date of Birth: 07/13/1931  Today's Date: 10/11/2013 Time: 1100-1155 PT Time Calculation (min): 55 min Charge:  Manual 1100-1155 Visit#: 7 of 12  Re-eval:      Authorization Visit#: 7 of 10   Subjective: Symptoms/Limitations Symptoms: Pt states she has had to void more over the past several days. Pt states she is working on reducing volume at home as well and feel she is having good results.      Exercise/Treatments     Pt volume Lt UE today 3322.85 cc; 10/05/2010 3101.60 cc  Manual Therapy Other Manual Therapy:  Pt received manual decongestive techniques including short neck, deep and superficial abdominal followed by routing fluid via Lt axillo-inguinal pathway and Lt -Rt intraaxillary anastomosis.  Posterior aspect was completed while sidelying secondary to pt not tolerating pron. Lt UE was washed, lotioned and then wrapped with 1/2" foam and multilayer short stretch bandages from base of MC to shoulder. Fingers were also bandaged  Physical Therapy Assessment and Plan PT Assessment and Plan Clinical Impression Statement: Pt volume has actually increased since 7/22 by 211 ccc pt had not been wrapped since yesterday.   PT Plan: explain the importance of keeping wrapped and to attempt to shower right before treatment     Goals    Problem List Patient Active Problem List   Diagnosis Date Noted  . Lymphedema 05/08/2013  . Invasive ductal carcinoma of left breast 10/03/2010       GP    RUSSELL,CINDY 10/11/2013, 2:05 PM

## 2013-10-12 ENCOUNTER — Ambulatory Visit (HOSPITAL_COMMUNITY)
Admission: RE | Admit: 2013-10-12 | Discharge: 2013-10-12 | Disposition: A | Discharge status: Home / Self Care | Payer: Medicare Other | Source: Ambulatory Visit | Attending: Internal Medicine | Admitting: Internal Medicine

## 2013-10-12 DIAGNOSIS — IMO0001 Reserved for inherently not codable concepts without codable children: Secondary | ICD-10-CM | POA: Diagnosis not present

## 2013-10-12 NOTE — Progress Notes (Signed)
Physical Therapy Treatment Patient Details  Name: Stacy [name] MRN: [id number] Date of Birth: 18-Jun-1931  Today's Date: 10/12/2013 Time: 1020-1110 PT Time Calculation (min): 50 min Charge:  Manual 1020-1110 Visit#: 8 of 12  Re-eval: 10/25/13    Authorization: medicare  Authorization Visit#: 8 of 10   Subjective: Symptoms/Limitations Symptoms: Pt states she continues to void more; waking up in the middle of the night.  Pt request that her UE is not wrapped quite as tight as last treatment.   Exercise/Treatments   Manual Therapy Other Manual Therapy: Pt received manual decongestive techniques including short neck, deep and superficial abdominal followed by routing fluid via Lt axillo-inguinal pathway and Lt -Rt intraaxillary anastomosis.  Posterior aspect was completed while sidelying secondary to pt not tolerating prone. Lt UE was washed, lotioned and then fingers were wrapped followd by UE wrapping with 1/2" foam and multilayer short stretch bandages from base of MC to shoulder.  Physical Therapy Assessment and Plan PT Assessment and Plan Clinical Impression Statement: Pt hand is less edematous; pt to be fitted for compression wrap next week  PT Plan: [place] in two treatments.      Problem List Patient Active Problem List   Diagnosis Date Noted  . Lymphedema 05/08/2013  . Invasive ductal carcinoma of left breast 10/03/2010       GP    RUSSELL,CINDY 10/12/2013, 11:46 AM

## 2013-10-16 ENCOUNTER — Ambulatory Visit (HOSPITAL_COMMUNITY)
Admission: RE | Admit: 2013-10-16 | Discharge: 2013-10-16 | Disposition: A | Discharge status: Home / Self Care | Payer: Medicare Other | Source: Ambulatory Visit | Attending: Oncology | Admitting: Oncology

## 2013-10-16 DIAGNOSIS — IMO0001 Reserved for inherently not codable concepts without codable children: Secondary | ICD-10-CM | POA: Insufficient documentation

## 2013-10-16 DIAGNOSIS — I972 Postmastectomy lymphedema syndrome: Secondary | ICD-10-CM | POA: Insufficient documentation

## 2013-10-16 NOTE — Progress Notes (Signed)
Physical Therapy Treatment Patient Details  Name: Stacy [name] MRN: [id number] Date of Birth: 1931-09-26  Today's Date: 10/16/2013 Time: 1110-1200 PT Time Calculation (min): 50 min Charge:  Manual 1110-1200 Visit#: 9 of 12  Re-eval: 10/25/13    Authorization: medicare     Authorization Visit#: 9 of 10   Subjective: Symptoms/Limitations Symptoms: Pt states that the bandages were comfortable.  States she is being measured for her garment tomorrow.  Therapist and pt agreed pt will no longer need therapy once her garment comes in      Exercise/Treatments      Manual Therapy Other Manual Therapy: Pt received manual decongestive techniques including short neck, deep and superficial abdominal followed by routing fluid via Lt axillo-inguinal pathway and Lt -Rt intraaxillary anastomosis.  Posterior aspect was completed while sidelying secondary to pt not tolerating prone. Lt UE was washed, lotioned and then fingers were wrapped followd by UE wrapping with 1/2" foam and multilayer short stretch bandages from base of MC to shoulder.  Physical Therapy Assessment and Plan PT Assessment and Plan Clinical Impression Statement: Pt decongesting well.  Will be able to stop manual therapy after garment comes  next week.   PT Plan: Measure next treatment for G-codes.     Goals  progressing   Problem List Patient Active Problem List   Diagnosis Date Noted  . Lymphedema 05/08/2013  . Invasive ductal carcinoma of left breast 10/03/2010       GP    RUSSELL,CINDY 10/16/2013, 4:34 PM

## 2013-10-17 NOTE — Progress Notes (Signed)
[place], [place] No address on file  Invasive ductal carcinoma of left breast - Plan: CBC with Differential, Comprehensive metabolic panel  CURRENT THERAPY: Letrozole 2.5 mg daily beginning on 09/19/2010 neoadjuvantly and then restarted in October 2012 following surgery in the adjuvant setting.  INTERVAL HISTORY: Stacy [name] 80 y.o. female returns for  regular  visit for followup of advanced stage IIIA, invasive ductal carcinoma of left breast presenting with a large mass, retraction of the nipple, 6 x 6 cm clinically with suspicious lymph nodes in the left axilla presentation. ER +100%, PR +100%, Ki-67 marker 66%, LV I was seen, but was felt to be grade 1. HER-2/neu was nonamplified. We treated her with neoadjuvant letrozole for several months. She then underwent left modified radical mastectomy on 01/09/2011 but still had a 4.8 cm cancer. Clinically there was reduction in the size of the tumor. 4 of 4 lymph nodes were involved. She then completed radiation therapy in Feb 2013 postoperatively and was restarted on the letrozole.   One would argue that this patient requires 5 years worth of AI therapy (beginning October 2012) as the evidence today only recommends 5 years worth of AI in post-menopausal women. However, another would argue that she had advanced disease and since she is tolerating therapy well, why not continue it lifelong since she is at high-risk of relapse? I will defer to attending oncologist.    I personally reviewed and went over laboratory results with the patient.  The results are noted within this dictation.  I personally reviewed and went over radiographic studies with the patient.  The results are noted within this dictation.  She is reminded that she is due for mammography this month for her annual screening exam.  She is tolerating Letrozole well without any complaints or tolerability issues.   Oncologically, she denies any complaints and ROS questioning is  negative.  Past Medical History  Diagnosis Date  . Diabetes mellitus type II   . Hypertension   . Depression   . Hyperlipemia   . Anxiety   . Anemia   . Cancer   . Fracture of hand 09/29/12    fell and fractured right hand  . Invasive ductal carcinoma of left breast 10/03/2010    has Invasive ductal carcinoma of left breast and Lymphedema on her problem list.     is allergic to ciprofloxacin; ferrous sulfate; and sulfa drugs cross reactors.  Stacy [name] does not currently have medications on file.  Past Surgical History  Procedure Laterality Date  . Appendectomy    . Cataract extraction w/ intraocular lens implant  08/26/10    Right; [facility]  . Incisional breast biopsy  09/05/10    Left, [facility], [place]  . Cataract extraction w/phaco  10/14/2010    Procedure: CATARACT EXTRACTION PHACO AND INTRAOCULAR LENS PLACEMENT (IOC);  Surgeon: Elta Guadeloupe T. [facility];  Location: [facility];  Service: Ophthalmology;  Laterality: Left;  CDE:  8.79  . Mastectomy      Denies any headaches, dizziness, double vision, fevers, chills, night sweats, nausea, vomiting, diarrhea, constipation, chest pain, heart palpitations, shortness of breath, blood in stool, black tarry stool, urinary pain, urinary burning, urinary frequency, hematuria.   PHYSICAL EXAMINATION  ECOG PERFORMANCE STATUS: 0 - Asymptomatic  Filed Vitals:   10/19/13 0936  BP: 125/62  Pulse: 60  Temp: 97.8 F (36.6 C)  Resp: 20    GENERAL:alert, healthy, no distress, well nourished, well developed, comfortable, cooperative, obese and smiling SKIN: skin color,  texture, turgor are normal, no rashes or significant lesions HEAD: Normocephalic, No masses, lesions, tenderness or abnormalities EYES: normal, PERRLA, EOMI, Conjunctiva are pink and non-injected EARS: External ears normal OROPHARYNX:mucous membranes are moist  NECK: supple, no adenopathy, thyroid normal size, non-tender, without nodularity, no stridor, non-tender, trachea  midline LYMPH:  no palpable lymphadenopathy, no hepatosplenomegaly BREAST:right breast normal without mass, skin or nipple changes or axillary nodes, left post-mastectomy site well healed and free of suspicious changes LUNGS: clear to auscultation and percussion HEART: regular rate & rhythm, no murmurs, no gallops, S1 normal and S2 normal ABDOMEN:abdomen soft, non-tender, obese, normal bowel sounds, no masses or organomegaly and no hepatosplenomegaly BACK: Back symmetric, no curvature., No CVA tenderness EXTREMITIES:less then 2 second capillary refill, no joint deformities, effusion, or inflammation, no edema, no skin discoloration, no clubbing, no cyanosis  NEURO: alert & oriented x 3 with fluent speech, no focal motor/sensory deficits, gait normal   LABORATORY DATA: CBC    Component Value Date/Time   WBC 5.3 10/19/2013 0935   RBC 3.75* 10/19/2013 0935   HGB 12.0 10/19/2013 0935   HCT 35.5* 10/19/2013 0935   PLT 177 10/19/2013 0935   MCV 94.7 10/19/2013 0935   MCH 32.0 10/19/2013 0935   MCHC 33.8 10/19/2013 0935   RDW 13.6 10/19/2013 0935   LYMPHSABS 1.1 10/19/2013 0935   MONOABS 0.4 10/19/2013 0935   EOSABS 0.3 10/19/2013 0935   BASOSABS 0.0 10/19/2013 0935      Chemistry      Component Value Date/Time   NA 138 01/12/2013 0901   K 4.4 01/12/2013 0901   CL 101 01/12/2013 0901   CO2 25 01/12/2013 0901   BUN 26* 01/12/2013 0901   CREATININE 1.11* 01/12/2013 0901      Component Value Date/Time   CALCIUM 10.1 01/12/2013 0901   ALKPHOS 71 01/12/2013 0901   AST 22 01/12/2013 0901   ALT 19 01/12/2013 0901   BILITOT 0.4 01/12/2013 0901        ASSESSMENT:  1. Advanced stage IIIA, invasive ductal carcinoma of left breast presenting with a large mass, retraction of the nipple, 6 x 6 cm clinically with suspicious lymph nodes in the left axilla presentation. ER +100%, PR +100%, Ki-67 marker 66%, LV I was seen, but was felt to be grade 1. HER-2/neu was nonamplified. We treated her with neoadjuvant  letrozole for several months. She then underwent left modified radical mastectomy on 01/09/2011 but still had a 4.8 cm cancer. Clinically there was reduction in the size of the tumor. 4 of 4 lymph nodes were involved. She then completed radiation therapy in Feb 2013 postoperatively and was restarted on the letrozole.  2. History of cataract operation.  3. Obesity.  4. Hypercholesterolemia on Crestor.  5. History of gastrointestinal upset on famotidine 20 mg a day.  6. History of hypertension.  7. Mild depression on Lexapro.  8. Appendectomy 1946.  9. Osteopenia, on Calcium and Vitamin D. Will repeat Bone Density in July 2016  10. Fractured right arm, resolved  11. Left upper extremity lymphedema, seeing physical therapy   Patient Active Problem List   Diagnosis Date Noted  . Lymphedema 05/08/2013  . Invasive ductal carcinoma of left breast 10/03/2010     PLAN:  1. I personally reviewed and went over laboratory results with the patient.  The results are noted within this dictation. 2. I personally reviewed and went over radiographic studies with the patient.  The results are noted within this dictation.     3. She is reminded that she is due this month for her annual mammogram. 4. Labs today and in 6 months: CBC diff, CMET 5. Bone density in July 2016.  Order placed. 6. Continue Letrozole daily 7. Return in 6 months for follow-up.   THERAPY PLAN:  NCCN guidelines recommends the following surveillance for invasive breast cancer:  A. History and Physical exam every 4-6 months for 5 years and then every 12 months.  B. Mammography every 12 months  C. Women on Tamoxifen: annual gynecologic assessment every 12 months if uterus is present.  D. Women on aromatase inhibitor or who experience ovarian failure secondary to treatment should have monitoring of bone health with a bone mineral density determination at baseline and periodically thereafter.  E. Assess and encourage adherence to  adjuvant endocrine therapy.  F. Evidence suggests that active lifestyle and achieving and maintaining an ideal body weight (20-25 BMI) may lead to optimal breast cancer outcomes.  All questions were answered. The patient knows to call the clinic with any problems, questions or concerns. We can certainly see the patient much sooner if necessary.  Patient and plan discussed with Dr. Gregory Formanek and he is in agreement with the aforementioned.   KEFALAS,THOMAS 10/19/2013     

## 2013-10-18 ENCOUNTER — Ambulatory Visit (HOSPITAL_COMMUNITY): Payer: Medicare Other | Admitting: Physical Therapy

## 2013-10-18 NOTE — Progress Notes (Signed)
Physical Therapy Treatment Patient Details  Name: Stacy [name] MRN: [id number] Date of Birth: 1931/10/05  Today's Date: 10/18/2013 Time: 0930-0935 PT Time Calculation (min): 5 min No charge Visit#: 9 of 12  Re-eval: 10/25/13    Authorization: medicare  Authorization Visit#: 9 of 10     Physical Therapy Assessment and Plan PT Assessment and Plan Clinical Impression Statement: Pt called to state she had recieved her garment and no longer will be coming to therapy  PT Plan: discharge patient.    Goals  met   Problem List Patient Active Problem List   Diagnosis Date Noted  . Lymphedema 05/08/2013  . Invasive ductal carcinoma of left breast 10/03/2010       GP Functional Assessment Tool Used: clinical judgement Functional Limitation: Other PT primary Other PT Primary Goal Status (E0194): At least 1 percent but less than 20 percent impaired, limited or restricted Other PT Primary Discharge Status [phone]): At least 1 percent but less than 20 percent impaired, limited or restricted  RUSSELL,CINDY 10/18/2013, 9:38 AM

## 2013-10-19 ENCOUNTER — Encounter (HOSPITAL_COMMUNITY): Payer: Self-pay | Admitting: Oncology

## 2013-10-19 ENCOUNTER — Encounter (HOSPITAL_COMMUNITY): Payer: Medicare Other | Attending: Oncology | Admitting: Oncology

## 2013-10-19 ENCOUNTER — Encounter (HOSPITAL_BASED_OUTPATIENT_CLINIC_OR_DEPARTMENT_OTHER): Payer: Medicare Other

## 2013-10-19 ENCOUNTER — Ambulatory Visit (HOSPITAL_COMMUNITY): Payer: Medicare Other | Admitting: Physical Therapy

## 2013-10-19 VITALS — BP 125/62 | HR 60 | Temp 97.8°F | Resp 20 | Wt 211.0 lb

## 2013-10-19 DIAGNOSIS — F329 Major depressive disorder, single episode, unspecified: Secondary | ICD-10-CM | POA: Insufficient documentation

## 2013-10-19 DIAGNOSIS — Z882 Allergy status to sulfonamides status: Secondary | ICD-10-CM | POA: Insufficient documentation

## 2013-10-19 DIAGNOSIS — E7800 Pure hypercholesterolemia, unspecified: Secondary | ICD-10-CM | POA: Insufficient documentation

## 2013-10-19 DIAGNOSIS — I972 Postmastectomy lymphedema syndrome: Secondary | ICD-10-CM | POA: Insufficient documentation

## 2013-10-19 DIAGNOSIS — Z881 Allergy status to other antibiotic agents status: Secondary | ICD-10-CM | POA: Diagnosis not present

## 2013-10-19 DIAGNOSIS — E785 Hyperlipidemia, unspecified: Secondary | ICD-10-CM | POA: Insufficient documentation

## 2013-10-19 DIAGNOSIS — E119 Type 2 diabetes mellitus without complications: Secondary | ICD-10-CM | POA: Diagnosis not present

## 2013-10-19 DIAGNOSIS — I10 Essential (primary) hypertension: Secondary | ICD-10-CM | POA: Diagnosis not present

## 2013-10-19 DIAGNOSIS — F3289 Other specified depressive episodes: Secondary | ICD-10-CM | POA: Diagnosis not present

## 2013-10-19 DIAGNOSIS — Z170 Estrogen receptor positive status [ER+]: Secondary | ICD-10-CM | POA: Diagnosis not present

## 2013-10-19 DIAGNOSIS — C50919 Malignant neoplasm of unspecified site of unspecified female breast: Secondary | ICD-10-CM | POA: Diagnosis present

## 2013-10-19 DIAGNOSIS — F411 Generalized anxiety disorder: Secondary | ICD-10-CM | POA: Diagnosis not present

## 2013-10-19 DIAGNOSIS — M949 Disorder of cartilage, unspecified: Secondary | ICD-10-CM | POA: Diagnosis not present

## 2013-10-19 DIAGNOSIS — C50912 Malignant neoplasm of unspecified site of left female breast: Secondary | ICD-10-CM

## 2013-10-19 DIAGNOSIS — Z9010 Acquired absence of unspecified breast and nipple: Secondary | ICD-10-CM | POA: Diagnosis not present

## 2013-10-19 DIAGNOSIS — M899 Disorder of bone, unspecified: Secondary | ICD-10-CM | POA: Diagnosis not present

## 2013-10-19 DIAGNOSIS — E669 Obesity, unspecified: Secondary | ICD-10-CM | POA: Insufficient documentation

## 2013-10-19 DIAGNOSIS — Z0000 Encounter for general adult medical examination without abnormal findings: Secondary | ICD-10-CM

## 2013-10-19 DIAGNOSIS — D649 Anemia, unspecified: Secondary | ICD-10-CM | POA: Diagnosis not present

## 2013-10-19 LAB — CBC WITH DIFFERENTIAL/PLATELET
BASOS PCT: 1 % (ref 0–1)
Basophils Absolute: 0 10*3/uL (ref 0.0–0.1)
EOS ABS: 0.3 10*3/uL (ref 0.0–0.7)
Eosinophils Relative: 6 % — ABNORMAL HIGH (ref 0–5)
HEMATOCRIT: 35.5 % — AB (ref 36.0–46.0)
HEMOGLOBIN: 12 g/dL (ref 12.0–15.0)
LYMPHS ABS: 1.1 10*3/uL (ref 0.7–4.0)
Lymphocytes Relative: 21 % (ref 12–46)
MCH: 32 pg (ref 26.0–34.0)
MCHC: 33.8 g/dL (ref 30.0–36.0)
MCV: 94.7 fL (ref 78.0–100.0)
MONO ABS: 0.4 10*3/uL (ref 0.1–1.0)
MONOS PCT: 8 % (ref 3–12)
Neutro Abs: 3.4 10*3/uL (ref 1.7–7.7)
Neutrophils Relative %: 64 % (ref 43–77)
Platelets: 177 10*3/uL (ref 150–400)
RBC: 3.75 MIL/uL — ABNORMAL LOW (ref 3.87–5.11)
RDW: 13.6 % (ref 11.5–15.5)
WBC: 5.3 10*3/uL (ref 4.0–10.5)

## 2013-10-19 LAB — COMPREHENSIVE METABOLIC PANEL
ALBUMIN: 4 g/dL (ref 3.5–5.2)
ALT: 18 U/L (ref 0–35)
ANION GAP: 12 (ref 5–15)
AST: 20 U/L (ref 0–37)
Alkaline Phosphatase: 72 U/L (ref 39–117)
BILIRUBIN TOTAL: 0.4 mg/dL (ref 0.3–1.2)
BUN: 22 mg/dL (ref 6–23)
CHLORIDE: 102 meq/L (ref 96–112)
CO2: 26 mEq/L (ref 19–32)
CREATININE: 1.09 mg/dL (ref 0.50–1.10)
Calcium: 10.4 mg/dL (ref 8.4–10.5)
GFR calc non Af Amer: 47 mL/min — ABNORMAL LOW (ref >90)
GFR, EST AFRICAN AMERICAN: 54 mL/min — AB (ref >90)
GLUCOSE: 145 mg/dL — AB (ref 70–99)
Potassium: 4.5 mEq/L (ref 3.7–5.3)
Sodium: 140 mEq/L (ref 137–147)
TOTAL PROTEIN: 7.1 g/dL (ref 6.0–8.3)

## 2013-10-19 NOTE — Patient Instructions (Signed)
[facility] Discharge Instructions  RECOMMENDATIONS MADE BY THE CONSULTANT AND ANY TEST RESULTS WILL BE SENT TO YOUR REFERRING PHYSICIAN.   Mammogram due this month Return for blood work and office visit in 6 months Bone density planned for July of 2016   Thank you for choosing [facility] to provide your oncology and hematology care.  To afford each patient quality time with our providers, please arrive at least 15 minutes before your scheduled appointment time.  With your help, our goal is to use those 15 minutes to complete the necessary work-up to ensure our physicians have the information they need to help with your evaluation and healthcare recommendations.    Effective January 1st, 2014, we ask that you re-schedule your appointment with our physicians should you arrive 10 or more minutes late for your appointment.  We strive to give you quality time with our providers, and arriving late affects you and other patients whose appointments are after yours.    Again, thank you for choosing [facility].  Our hope is that these requests will decrease the amount of time that you wait before being seen by our physicians.       _____________________________________________________________  Should you have questions after your visit to [facility], please contact our office at [phone] between the hours of 8:30 a.m. and 4:30 p.m.  Voicemails left after 4:30 p.m. will not be returned until the following business day.  For prescription refill requests, have your pharmacy contact our office with your prescription refill request.    _______________________________________________________________  We hope that we have given you very good care.  You may receive a patient satisfaction survey in the mail, please complete it and return it as soon as possible.  We value your  feedback!  _______________________________________________________________  Have you asked about our STAR program?  STAR stands for Survivorship Training and Rehabilitation, and this is a nationally recognized cancer care program that focuses on survivorship and rehabilitation.  Cancer and cancer treatments may cause problems, such as, pain, making you feel tired and keeping you from doing the things that you need or want to do. Cancer rehabilitation can help. Our goal is to reduce these troubling effects and help you have the best quality of life possible.  You may receive a survey from a nurse that asks questions about your current state of health.  Based on the survey results, all eligible patients will be referred to the [facility] program for an evaluation so we can better serve you!  A frequently asked questions sheet is available upon request.

## 2013-10-19 NOTE — Progress Notes (Signed)
LABS DRAWN FOR CBCD,CMP 

## 2013-10-25 ENCOUNTER — Other Ambulatory Visit (HOSPITAL_COMMUNITY): Payer: Self-pay | Admitting: Oncology

## 2013-10-25 ENCOUNTER — Ambulatory Visit (HOSPITAL_COMMUNITY)
Admission: RE | Admit: 2013-10-25 | Discharge: 2013-10-25 | Disposition: A | Discharge status: Home / Self Care | Payer: Medicare Other | Source: Ambulatory Visit | Attending: Oncology | Admitting: Oncology

## 2013-10-25 DIAGNOSIS — Z1231 Encounter for screening mammogram for malignant neoplasm of breast: Secondary | ICD-10-CM

## 2013-10-25 DIAGNOSIS — Z0000 Encounter for general adult medical examination without abnormal findings: Secondary | ICD-10-CM

## 2013-10-25 DIAGNOSIS — C50912 Malignant neoplasm of unspecified site of left female breast: Secondary | ICD-10-CM

## 2013-12-13 ENCOUNTER — Telehealth (HOSPITAL_COMMUNITY): Payer: Self-pay | Admitting: Specialist

## 2014-01-23 ENCOUNTER — Other Ambulatory Visit (HOSPITAL_COMMUNITY): Payer: Self-pay | Admitting: Oncology

## 2014-04-18 NOTE — Assessment & Plan Note (Addendum)
Stage IIIA, invasive ductal carcinoma of left breast presenting with a large mass, retraction of the nipple, 6 x 6 cm clinically with suspicious lymph nodes in the left axilla presentation. ER +100%, PR +100%, Ki-67 marker 66%, LV I was seen, but was felt to be grade 1. HER-2/neu was nonamplified. We treated her with neoadjuvant letrozole for several months. She then underwent left modified radical mastectomy on 01/09/2011 but still had a 4.8 cm cancer. Clinically there was reduction in the size of the tumor. 4 of 4 lymph nodes were involved. She then completed radiation therapy in Feb 2013 postoperatively and was restarted on the letrozole.  Today, I spent time discussing the role of BCI testing.  She is interested in this test, but not at this time.  She wishes to have it performed closer to her 5 year mark for anti-endocrine therapy.   This will be helpful in determining the utility of continuing endocrine therapy past 5 years.  Labs today: CBC diff, CMET.  Labs in 6 months: CBC diff, CMET.  She is due for bone density exam in July 2016 and this is ordered and scheduled for 09/24/2014.  Return in 6 months for follow-up. 

## 2014-04-18 NOTE — Progress Notes (Signed)
[place], [place] No address on file  Invasive ductal carcinoma of left breast - Plan: CBC with Differential, Comprehensive metabolic panel  CURRENT THERAPY: Letrozole 2.5 mg daily beginning on 09/19/2010 neoadjuvantly and then restarted in October 2012 following surgery in the adjuvant setting.  INTERVAL HISTORY: Stacy [name] 81 y.o. female returns for followup of advanced stage IIIA, invasive ductal carcinoma of left breast presenting with a large mass, retraction of the nipple, 6 x 6 cm clinically with suspicious lymph nodes in the left axilla presentation. ER +100%, PR +100%, Ki-67 marker 66%, LV I was seen, but was felt to be grade 1. HER-2/neu was nonamplified. We treated her with neoadjuvant letrozole for several months. She then underwent left modified radical mastectomy on 01/09/2011 but still had a 4.8 cm cancer. Clinically there was reduction in the size of the tumor. 4 of 4 lymph nodes were involved. She then completed radiation therapy in Feb 2013 postoperatively and was restarted on the letrozole.    I personally reviewed and went over laboratory results with the patient.  The results are noted within this dictation.  I personally reviewed and went over radiographic studies with the patient.  The results are noted within this dictation.  Mammogram on 10/25/2013 was BIRADS 1.  Oncologically, she is doing very well without any complaints and a negative ROS questioning.   Past Medical History  Diagnosis Date  . Diabetes mellitus type II   . Hypertension   . Depression   . Hyperlipemia   . Anxiety   . Anemia   . Cancer   . Fracture of hand 09/29/12    fell and fractured right hand  . Invasive ductal carcinoma of left breast 10/03/2010    has Invasive ductal carcinoma of left breast and Lymphedema on her problem list.     is allergic to ciprofloxacin; ferrous sulfate; and sulfa drugs cross reactors.  Stacy [name] does not currently have medications on file.  Past  Surgical History  Procedure Laterality Date  . Appendectomy    . Cataract extraction w/ intraocular lens implant  08/26/10    Right; [facility]  . Incisional breast biopsy  09/05/10    Left, [facility], [facility]  . Cataract extraction w/phaco  10/14/2010    Procedure: CATARACT EXTRACTION PHACO AND INTRAOCULAR LENS PLACEMENT (IOC);  Surgeon: Elta Guadeloupe T. Gershon Crane;  Location: [facility];  Service: Ophthalmology;  Laterality: Left;  CDE:  8.79  . Mastectomy      Denies any headaches, dizziness, double vision, fevers, chills, night sweats, nausea, vomiting, diarrhea, constipation, chest pain, heart palpitations, shortness of breath, blood in stool, black tarry stool, urinary pain, urinary burning, urinary frequency, hematuria.   PHYSICAL EXAMINATION  ECOG PERFORMANCE STATUS: 0 - Asymptomatic  Filed Vitals:   04/19/14 1013  BP: 109/78  Pulse: 60  Temp: 97.7 F (36.5 C)  Resp: 18    GENERAL:alert, no distress, well nourished, well developed, comfortable, cooperative, obese and smiling SKIN: skin color, texture, turgor are normal, no rashes or significant lesions HEAD: Normocephalic, No masses, lesions, tenderness or abnormalities EYES: normal, PERRLA, EOMI, Conjunctiva are pink and non-injected EARS: External ears normal OROPHARYNX:lips, buccal mucosa, and tongue normal and mucous membranes are moist  NECK: supple, no adenopathy, thyroid normal size, non-tender, without nodularity, no stridor, non-tender, trachea midline LYMPH:  no palpable lymphadenopathy, no hepatosplenomegaly BREAST:right breast normal without mass, skin or nipple changes or axillary nodes, left post-mastectomy site well healed and free of suspicious changes  LUNGS: clear to auscultation and percussion HEART: regular rate & rhythm, no murmurs, no gallops, S1 normal and S2 normal ABDOMEN:abdomen soft, non-tender, obese, normal bowel sounds and no masses or organomegaly BACK: Back symmetric, no curvature., No CVA  tenderness EXTREMITIES:less then 2 second capillary refill, no joint deformities, effusion, or inflammation, no edema, no skin discoloration, no clubbing, no cyanosis  NEURO: alert & oriented x 3 with fluent speech, no focal motor/sensory deficits, gait normal   LABORATORY DATA: CBC    Component Value Date/Time   WBC 4.7 04/19/2014 0956   RBC 3.64* 04/19/2014 0956   HGB 11.6* 04/19/2014 0956   HCT 35.1* 04/19/2014 0956   PLT 163 04/19/2014 0956   MCV 96.4 04/19/2014 0956   MCH 31.9 04/19/2014 0956   MCHC 33.0 04/19/2014 0956   RDW 13.3 04/19/2014 0956   LYMPHSABS 1.0 04/19/2014 0956   MONOABS 0.6 04/19/2014 0956   EOSABS 0.2 04/19/2014 0956   BASOSABS 0.0 04/19/2014 0956      Chemistry      Component Value Date/Time   NA 139 04/19/2014 0956   K 4.6 04/19/2014 0956   CL 105 04/19/2014 0956   CO2 26 04/19/2014 0956   BUN 29* 04/19/2014 0956   CREATININE 1.10 04/19/2014 0956      Component Value Date/Time   CALCIUM 10.4 04/19/2014 0956   ALKPHOS 60 04/19/2014 0956   AST 24 04/19/2014 0956   ALT 20 04/19/2014 0956   BILITOT 0.6 04/19/2014 0956       RADIOGRAPHIC STUDIES:  10/25/2013  CLINICAL DATA: Screening.  EXAM: DIGITAL SCREENING UNILATERAL RIGHT MAMMOGRAM WITH CAD  COMPARISON: Previous exam(s).  ACR Breast Density Category b: There are scattered areas of fibroglandular density.  FINDINGS: There are no findings suspicious for malignancy. Images were processed with CAD.  IMPRESSION: No mammographic evidence of malignancy. A result letter of this screening mammogram will be mailed directly to the patient.  RECOMMENDATION: Screening mammogram in one year. (Code:[id number]-B-01Y)  BI-RADS CATEGORY 1: Negative.   Electronically Signed  By: Lillia Mountain M.D.  On: 10/25/2013 16:27     ASSESSMENT AND PLAN:  Invasive ductal carcinoma of left breast Stage IIIA, invasive ductal carcinoma of left breast presenting with a large mass, retraction  of the nipple, 6 x 6 cm clinically with suspicious lymph nodes in the left axilla presentation. ER +100%, PR +100%, Ki-67 marker 66%, LV I was seen, but was felt to be grade 1. HER-2/neu was nonamplified. We treated her with neoadjuvant letrozole for several months. She then underwent left modified radical mastectomy on 01/09/2011 but still had a 4.8 cm cancer. Clinically there was reduction in the size of the tumor. 4 of 4 lymph nodes were involved. She then completed radiation therapy in Feb 2013 postoperatively and was restarted on the letrozole.  Today, I spent time discussing the role of BCI testing.  She is interested in this test, but not at this time.  She wishes to have it performed closer to her 5 year mark for anti-endocrine therapy.   This will be helpful in determining the utility of continuing endocrine therapy past 5 years.  Labs today: CBC diff, CMET.  Labs in 6 months: CBC diff, CMET.  She is due for bone density exam in July 2016 and this is ordered and scheduled for 09/24/2014.  Return in 6 months for follow-up.   THERAPY PLAN:  NCCN guidelines recommends the following surveillance for invasive breast cancer:  A. History and Physical exam every 4-6 months  for 5 years and then every 12 months.  B. Mammography every 12 months  C. Women on Tamoxifen: annual gynecologic assessment every 12 months if uterus is present.  D. Women on aromatase inhibitor or who experience ovarian failure secondary to treatment should have monitoring of bone health with a bone mineral density determination at baseline and periodically thereafter.  E. Assess and encourage adherence to adjuvant endocrine therapy.  F. Evidence suggests that active lifestyle and achieving and maintaining an ideal body weight (20-25 BMI) may lead to optimal breast cancer outcomes.   All questions were answered. The patient knows to call the clinic with any problems, questions or concerns. We can certainly see the patient much sooner  if necessary.  Patient and plan discussed with Dr. Ancil Linsey and she is in agreement with the aforementioned.   Ryshawn Sanzone 04/19/2014

## 2014-04-19 ENCOUNTER — Encounter (HOSPITAL_BASED_OUTPATIENT_CLINIC_OR_DEPARTMENT_OTHER): Payer: Medicare Other

## 2014-04-19 ENCOUNTER — Encounter (HOSPITAL_COMMUNITY): Payer: Medicare Other | Attending: Oncology | Admitting: Oncology

## 2014-04-19 ENCOUNTER — Encounter (HOSPITAL_COMMUNITY): Payer: Self-pay | Admitting: Oncology

## 2014-04-19 VITALS — BP 109/78 | HR 60 | Temp 97.7°F | Resp 18 | Wt 206.8 lb

## 2014-04-19 DIAGNOSIS — C50912 Malignant neoplasm of unspecified site of left female breast: Secondary | ICD-10-CM | POA: Insufficient documentation

## 2014-04-19 DIAGNOSIS — Z170 Estrogen receptor positive status [ER+]: Secondary | ICD-10-CM

## 2014-04-19 DIAGNOSIS — C773 Secondary and unspecified malignant neoplasm of axilla and upper limb lymph nodes: Secondary | ICD-10-CM

## 2014-04-19 LAB — CBC WITH DIFFERENTIAL/PLATELET
BASOS ABS: 0 10*3/uL (ref 0.0–0.1)
Basophils Relative: 1 % (ref 0–1)
EOS ABS: 0.2 10*3/uL (ref 0.0–0.7)
EOS PCT: 4 % (ref 0–5)
HCT: 35.1 % — ABNORMAL LOW (ref 36.0–46.0)
HEMOGLOBIN: 11.6 g/dL — AB (ref 12.0–15.0)
LYMPHS PCT: 21 % (ref 12–46)
Lymphs Abs: 1 10*3/uL (ref 0.7–4.0)
MCH: 31.9 pg (ref 26.0–34.0)
MCHC: 33 g/dL (ref 30.0–36.0)
MCV: 96.4 fL (ref 78.0–100.0)
Monocytes Absolute: 0.6 10*3/uL (ref 0.1–1.0)
Monocytes Relative: 12 % (ref 3–12)
Neutro Abs: 2.9 10*3/uL (ref 1.7–7.7)
Neutrophils Relative %: 62 % (ref 43–77)
Platelets: 163 10*3/uL (ref 150–400)
RBC: 3.64 MIL/uL — AB (ref 3.87–5.11)
RDW: 13.3 % (ref 11.5–15.5)
WBC: 4.7 10*3/uL (ref 4.0–10.5)

## 2014-04-19 LAB — COMPREHENSIVE METABOLIC PANEL
ALT: 20 U/L (ref 0–35)
AST: 24 U/L (ref 0–37)
Albumin: 4.1 g/dL (ref 3.5–5.2)
Alkaline Phosphatase: 60 U/L (ref 39–117)
Anion gap: 8 (ref 5–15)
BILIRUBIN TOTAL: 0.6 mg/dL (ref 0.3–1.2)
BUN: 29 mg/dL — AB (ref 6–23)
CO2: 26 mmol/L (ref 19–32)
CREATININE: 1.1 mg/dL (ref 0.50–1.10)
Calcium: 10.4 mg/dL (ref 8.4–10.5)
Chloride: 105 mmol/L (ref 96–112)
GFR calc non Af Amer: 46 mL/min — ABNORMAL LOW (ref >90)
GFR, EST AFRICAN AMERICAN: 53 mL/min — AB (ref >90)
Glucose, Bld: 119 mg/dL — ABNORMAL HIGH (ref 70–99)
Potassium: 4.6 mmol/L (ref 3.5–5.1)
SODIUM: 139 mmol/L (ref 135–145)
Total Protein: 7.2 g/dL (ref 6.0–8.3)

## 2014-04-19 NOTE — Progress Notes (Signed)
LABS FOR CMP,CBCD 

## 2014-04-19 NOTE — Patient Instructions (Signed)
[place] at [facility]  Discharge Instructions:  Labs are reviewed today.  They look great and follow below. Return in 6 months for follow-up with repeat labs. We discussed the role for breast cancer index testing to help provide information regarding the utility of continued endocrine therapy (letrozole) past 5 years.  At this time, you have deferred this at this time.  _______________________________________________________________  Thank you for choosing [place] at [facility] to provide your oncology and hematology care.  To afford each patient quality time with our providers, please arrive at least 15 minutes before your scheduled appointment.  You need to re-schedule your appointment if you arrive 10 or more minutes late.  We strive to give you quality time with our providers, and arriving late affects you and other patients whose appointments are after yours.  Also, if you no show three or more times for appointments you may be dismissed from the clinic.  Again, thank you for choosing [place] at [facility] hope is that these requests will allow you access to exceptional care and in a timely manner. _______________________________________________________________  If you have questions after your visit, please contact our office at [phone] between the hours of 8:30 a.m. and 5:00 p.m. Voicemails left after 4:30 p.m. will not be returned until the following business day. _______________________________________________________________  For prescription refill requests, have your pharmacy contact our office. _______________________________________________________________  Recommendations made by the consultant and any test results will be sent to your referring physician. _______________________________________________________________

## 2014-04-25 ENCOUNTER — Other Ambulatory Visit (HOSPITAL_COMMUNITY): Payer: Self-pay | Admitting: Oncology

## 2014-04-25 DIAGNOSIS — C50912 Malignant neoplasm of unspecified site of left female breast: Secondary | ICD-10-CM

## 2014-04-25 DIAGNOSIS — Z0000 Encounter for general adult medical examination without abnormal findings: Secondary | ICD-10-CM

## 2014-04-30 ENCOUNTER — Ambulatory Visit (HOSPITAL_COMMUNITY): Payer: Medicare Other

## 2014-06-14 ENCOUNTER — Other Ambulatory Visit (HOSPITAL_COMMUNITY): Payer: Self-pay | Admitting: Oncology

## 2014-09-11 ENCOUNTER — Other Ambulatory Visit (HOSPITAL_COMMUNITY): Payer: Self-pay | Admitting: Oncology

## 2014-09-24 ENCOUNTER — Ambulatory Visit (HOSPITAL_COMMUNITY)
Admission: RE | Admit: 2014-09-24 | Discharge: 2014-09-24 | Disposition: A | Discharge status: Home / Self Care | Payer: Medicare Other | Source: Ambulatory Visit | Attending: Oncology | Admitting: Oncology

## 2014-09-24 DIAGNOSIS — Z853 Personal history of malignant neoplasm of breast: Secondary | ICD-10-CM | POA: Insufficient documentation

## 2014-09-24 DIAGNOSIS — Z780 Asymptomatic menopausal state: Secondary | ICD-10-CM | POA: Insufficient documentation

## 2014-09-24 DIAGNOSIS — M8580 Other specified disorders of bone density and structure, unspecified site: Secondary | ICD-10-CM

## 2014-10-17 ENCOUNTER — Other Ambulatory Visit (HOSPITAL_COMMUNITY): Payer: Self-pay

## 2014-10-29 ENCOUNTER — Ambulatory Visit (HOSPITAL_COMMUNITY): Payer: Medicare Other

## 2014-10-31 ENCOUNTER — Ambulatory Visit (HOSPITAL_COMMUNITY): Payer: Medicare Other | Admitting: Oncology

## 2014-10-31 ENCOUNTER — Other Ambulatory Visit (HOSPITAL_COMMUNITY): Payer: Medicare Other

## 2014-10-31 ENCOUNTER — Ambulatory Visit (HOSPITAL_COMMUNITY): Payer: Medicare Other | Admitting: Hematology & Oncology

## 2014-12-05 ENCOUNTER — Other Ambulatory Visit (HOSPITAL_COMMUNITY): Payer: Self-pay | Admitting: Oncology

## 2014-12-05 DIAGNOSIS — Z1231 Encounter for screening mammogram for malignant neoplasm of breast: Secondary | ICD-10-CM

## 2014-12-07 ENCOUNTER — Telehealth (HOSPITAL_COMMUNITY): Payer: Self-pay | Admitting: *Deleted

## 2014-12-07 ENCOUNTER — Encounter (HOSPITAL_COMMUNITY): Payer: Medicare Other | Attending: Oncology | Admitting: Hematology & Oncology

## 2014-12-07 ENCOUNTER — Encounter (HOSPITAL_COMMUNITY): Payer: Self-pay | Admitting: Hematology & Oncology

## 2014-12-07 ENCOUNTER — Encounter (HOSPITAL_BASED_OUTPATIENT_CLINIC_OR_DEPARTMENT_OTHER): Payer: Medicare Other

## 2014-12-07 VITALS — BP 127/60 | HR 55 | Temp 98.1°F | Resp 18

## 2014-12-07 DIAGNOSIS — C50912 Malignant neoplasm of unspecified site of left female breast: Secondary | ICD-10-CM

## 2014-12-07 DIAGNOSIS — C50919 Malignant neoplasm of unspecified site of unspecified female breast: Secondary | ICD-10-CM | POA: Insufficient documentation

## 2014-12-07 DIAGNOSIS — M8580 Other specified disorders of bone density and structure, unspecified site: Secondary | ICD-10-CM | POA: Insufficient documentation

## 2014-12-07 LAB — CBC WITH DIFFERENTIAL/PLATELET
Basophils Absolute: 0 10*3/uL (ref 0.0–0.1)
Basophils Relative: 1 %
EOS PCT: 5 %
Eosinophils Absolute: 0.2 10*3/uL (ref 0.0–0.7)
HEMATOCRIT: 34.8 % — AB (ref 36.0–46.0)
Hemoglobin: 11.6 g/dL — ABNORMAL LOW (ref 12.0–15.0)
LYMPHS ABS: 1 10*3/uL (ref 0.7–4.0)
LYMPHS PCT: 20 %
MCH: 32.1 pg (ref 26.0–34.0)
MCHC: 33.3 g/dL (ref 30.0–36.0)
MCV: 96.4 fL (ref 78.0–100.0)
MONO ABS: 0.5 10*3/uL (ref 0.1–1.0)
Monocytes Relative: 9 %
Neutro Abs: 3.5 10*3/uL (ref 1.7–7.7)
Neutrophils Relative %: 67 %
PLATELETS: 161 10*3/uL (ref 150–400)
RBC: 3.61 MIL/uL — ABNORMAL LOW (ref 3.87–5.11)
RDW: 13.7 % (ref 11.5–15.5)
WBC: 5.2 10*3/uL (ref 4.0–10.5)

## 2014-12-07 LAB — COMPREHENSIVE METABOLIC PANEL
ALT: 20 U/L (ref 14–54)
AST: 26 U/L (ref 15–41)
Albumin: 4.2 g/dL (ref 3.5–5.0)
Alkaline Phosphatase: 58 U/L (ref 38–126)
Anion gap: 8 (ref 5–15)
BILIRUBIN TOTAL: 0.7 mg/dL (ref 0.3–1.2)
BUN: 26 mg/dL — ABNORMAL HIGH (ref 6–20)
CHLORIDE: 105 mmol/L (ref 101–111)
CO2: 26 mmol/L (ref 22–32)
CREATININE: 1.14 mg/dL — AB (ref 0.44–1.00)
Calcium: 10 mg/dL (ref 8.9–10.3)
GFR, EST AFRICAN AMERICAN: 51 mL/min — AB (ref >60)
GFR, EST NON AFRICAN AMERICAN: 44 mL/min — AB (ref >60)
Glucose, Bld: 116 mg/dL — ABNORMAL HIGH (ref 65–99)
POTASSIUM: 4.5 mmol/L (ref 3.5–5.1)
Sodium: 139 mmol/L (ref 135–145)
TOTAL PROTEIN: 7.3 g/dL (ref 6.5–8.1)

## 2014-12-07 NOTE — Patient Instructions (Signed)
..  [place] at [facility] Discharge Instructions  RECOMMENDATIONS MADE BY THE CONSULTANT AND ANY TEST RESULTS WILL BE SENT TO YOUR REFERRING PHYSICIAN.  We will send a breast cancer index Return in 6 months  Thank you for choosing [place] at [facility] to provide your oncology and hematology care.  To afford each patient quality time with our provider, please arrive at least 15 minutes before your scheduled appointment time.    You need to re-schedule your appointment should you arrive 10 or more minutes late.  We strive to give you quality time with our providers, and arriving late affects you and other patients whose appointments are after yours.  Also, if you no show three or more times for appointments you may be dismissed from the clinic at the providers discretion.     Again, thank you for choosing [facility].  Our hope is that these requests will decrease the amount of time that you wait before being seen by our physicians.       _____________________________________________________________  Should you have questions after your visit to [facility], please contact our office at [phone] between the hours of 8:30 a.m. and 4:30 p.m.  Voicemails left after 4:30 p.m. will not be returned until the following business day.  For prescription refill requests, have your pharmacy contact our office.

## 2014-12-07 NOTE — Progress Notes (Signed)
Labs drawn

## 2014-12-07 NOTE — Progress Notes (Signed)
[place], MD No address on file  Stage IIIA, invasive ductal carcinoma of left breast   ER +100%, PR +100%, Ki-67 marker 66% Left modified radical mastectomy on 01/09/2011 XRT Neoadjuvant letrozole  CURRENT THERAPY: Letrozole 2.5 mg daily beginning on 09/19/2010 neoadjuvantly and then restarted in October 2012 following surgery in the adjuvant setting.  INTERVAL HISTORY: Stacy [name] 81 y.o. female returns for followup of advanced stage IIIA, invasive ductal carcinoma of left breast presenting with a large mass, retraction of the nipple, 6 x 6 cm clinically with suspicious lymph nodes in the left axilla presentation. ER +100%, PR +100%, Ki-67 marker 66%, LV I was seen, but was felt to be grade 1. HER-2/neu was nonamplified. We treated her with neoadjuvant letrozole for several months. She then underwent left modified radical mastectomy on 01/09/2011 but still had a 4.8 cm cancer. Clinically there was reduction in the size of the tumor. 4 of 4 lymph nodes were involved. She then completed radiation therapy in Feb 2013 postoperatively and was restarted on the letrozole.    Oncologically, she is doing very well without any complaints and a negative ROS questioning.   Stacy [name] is here alone today. We discussed her recent bone density results. She has her next mammogram scheduled for Monday, 9/26.  She takes calcium and Vitamin D daily. She remarks that her appetite is 'too good'. Her bowels are normal.  When asked about the left ankle edema during the physical exam, she attributed it to arthritis and that this was normal and chronic.   Past Medical History  Diagnosis Date  . Diabetes mellitus type II   . Hypertension   . Depression   . Hyperlipemia   . Anxiety   . Anemia   . Cancer   . Fracture of hand 09/29/12    fell and fractured right hand  . Invasive ductal carcinoma of left breast 10/03/2010    has Invasive ductal carcinoma of left breast and Lymphedema on her  problem list.     is allergic to ciprofloxacin; ferrous sulfate; and sulfa drugs cross reactors.  Stacy [name] does not currently have medications on file.  Past Surgical History  Procedure Laterality Date  . Appendectomy    . Cataract extraction w/ intraocular lens implant  08/26/10    Right; [facility]  . Incisional breast biopsy  09/05/10    Left, [facility], [facility]  . Cataract extraction w/phaco  10/14/2010    Procedure: CATARACT EXTRACTION PHACO AND INTRAOCULAR LENS PLACEMENT (IOC);  Surgeon: Elta Guadeloupe T. Gershon Crane;  Location: [facility];  Service: Ophthalmology;  Laterality: Left;  CDE:  8.79  . Mastectomy      Denies any headaches, dizziness, double vision, fevers, chills, night sweats, nausea, vomiting, diarrhea, constipation, chest pain, heart palpitations, shortness of breath, blood in stool, black tarry stool, urinary pain, urinary burning, urinary frequency, hematuria. 14 point review of systems was performed and is negative except as detailed under history of present illness and above    PHYSICAL EXAMINATION  ECOG PERFORMANCE STATUS: 0 - Asymptomatic  Filed Vitals:   12/07/14 1009  BP: 127/60  Pulse: 55  Temp: 98.1 F (36.7 C)  Resp: 18    GENERAL:alert, no distress, well nourished, well developed, comfortable, cooperative, obese and smiling SKIN: skin color, texture, turgor are normal, no rashes or significant lesions HEAD: Normocephalic, No masses, lesions, tenderness or abnormalities EYES: normal, PERRLA, EOMI, Conjunctiva are pink and non-injected EARS: External ears normal OROPHARYNX:lips, buccal  mucosa, and tongue normal and mucous membranes are moist  NECK: supple, no adenopathy, thyroid normal size, non-tender, without nodularity, no stridor, non-tender, trachea midline LYMPH:  no palpable lymphadenopathy, no hepatosplenomegaly BREAST:right breast normal without mass, skin or nipple changes or axillary nodes, left post-mastectomy site well healed and free of suspicious  changes both axillary regions are free of adenopathy LUNGS: clear to auscultation and percussion HEART: regular rate & rhythm, no murmurs, no gallops, S1 normal and S2 normal ABDOMEN:abdomen soft, non-tender, obese, normal bowel sounds and no masses or organomegaly BACK: Back symmetric, no curvature., No CVA tenderness EXTREMITIES:less then 2 second capillary refill, no joint deformities, effusion, or inflammation, no edema, no skin discoloration, no clubbing, no cyanosis     Left ankle edema noted. NEURO: alert & oriented x 3 with fluent speech, no focal motor/sensory deficits, gait normal   LABORATORY DATA: I have reviewed the labs below. CBC    Component Value Date/Time   WBC 5.2 12/07/2014 1004   RBC 3.61* 12/07/2014 1004   HGB 11.6* 12/07/2014 1004   HCT 34.8* 12/07/2014 1004   PLT 161 12/07/2014 1004   MCV 96.4 12/07/2014 1004   MCH 32.1 12/07/2014 1004   MCHC 33.3 12/07/2014 1004   RDW 13.7 12/07/2014 1004   LYMPHSABS 1.0 12/07/2014 1004   MONOABS 0.5 12/07/2014 1004   EOSABS 0.2 12/07/2014 1004   BASOSABS 0.0 12/07/2014 1004      Chemistry      Component Value Date/Time   NA 139 12/07/2014 1004   K 4.5 12/07/2014 1004   CL 105 12/07/2014 1004   CO2 26 12/07/2014 1004   BUN 26* 12/07/2014 1004   CREATININE 1.14* 12/07/2014 1004      Component Value Date/Time   CALCIUM 10.0 12/07/2014 1004   ALKPHOS 58 12/07/2014 1004   AST 26 12/07/2014 1004   ALT 20 12/07/2014 1004   BILITOT 0.7 12/07/2014 1004       RADIOGRAPHIC STUDIES:  10/25/2013  CLINICAL DATA: Screening.  EXAM: DIGITAL SCREENING UNILATERAL RIGHT MAMMOGRAM WITH CAD  COMPARISON: Previous exam(s).  ACR Breast Density Category b: There are scattered areas of fibroglandular density.  FINDINGS: There are no findings suspicious for malignancy. Images were processed with CAD.  IMPRESSION: No mammographic evidence of malignancy. A result letter of this screening mammogram will be mailed  directly to the patient.  RECOMMENDATION: Screening mammogram in one year. (Code:SM-B-01Y)  BI-RADS CATEGORY 1: Negative.   Electronically Signed  By: Lillia Mountain M.D.  On: 10/25/2013 16:27     ASSESSMENT AND PLAN:  Stage IIIA, invasive ductal carcinoma of left breast  ER +100%, PR +100%, Ki-67 marker 66% Left modified radical mastectomy on 01/09/2011 XRT Neoadjuvant letrozole, with ongoing AI therapy DEXA 09/24/2014 with osteopenia Calcium and Vitamin D   She is doing well without evidence of recurrence.  She is not interested in pursuing prolia or bisphosphonate therapy at this time. We discussed her DEXA and ongoing AI use, risks and benefits in detail. She takes calcium plus D daily.   She has her mammogram scheduled for Monday. We will send a BCI on her tumor block to see is she would benefit from extended endocrine therapy. We can discuss this at f/u.  I will see Stacy [name] again in 6 months for routine follow up and breast exam.  THERAPY PLAN:  NCCN guidelines recommends the following surveillance for invasive breast cancer:  A. History and Physical exam every 4-6 months for 5 years and then every 12  months.  B. Mammography every 12 months  C. Women on Tamoxifen: annual gynecologic assessment every 12 months if uterus is present.  D. Women on aromatase inhibitor or who experience ovarian failure secondary to treatment should have monitoring of bone health with a bone mineral density determination at baseline and periodically thereafter.  E. Assess and encourage adherence to adjuvant endocrine therapy.  F. Evidence suggests that active lifestyle and achieving and maintaining an ideal body weight (20-25 BMI) may lead to optimal breast cancer outcomes.   All questions were answered. The patient knows to call the clinic with any problems, questions or concerns. We can certainly see the patient much sooner if necessary.  This document serves as a record of services  personally performed by Ancil Linsey, MD. It was created on her behalf by Arlyce Harman, a trained medical scribe. The creation of this record is based on the scribe's personal observations and the provider's statements to them. This document has been checked and approved by the attending provider.  I have reviewed the above documentation for accuracy and completeness, and I agree with the above.  Kelby Fam. Penland, MD  12/07/2014

## 2014-12-07 NOTE — Telephone Encounter (Signed)
BCI sent to [facility] for this patient. Alyse Low confirmed receipt of request.

## 2014-12-10 ENCOUNTER — Ambulatory Visit (HOSPITAL_COMMUNITY)
Admission: RE | Admit: 2014-12-10 | Discharge: 2014-12-10 | Disposition: A | Discharge status: Home / Self Care | Payer: Medicare Other | Source: Ambulatory Visit | Attending: Oncology | Admitting: Oncology

## 2014-12-10 DIAGNOSIS — Z1231 Encounter for screening mammogram for malignant neoplasm of breast: Secondary | ICD-10-CM

## 2015-03-09 ENCOUNTER — Other Ambulatory Visit (HOSPITAL_COMMUNITY): Payer: Self-pay | Admitting: Oncology

## 2015-03-17 ENCOUNTER — Emergency Department (HOSPITAL_COMMUNITY)
Admission: EM | Admit: 2015-03-17 | Discharge: 2015-03-17 | Disposition: A | Discharge status: Home / Self Care | Payer: Medicare Other | Attending: Emergency Medicine | Admitting: Emergency Medicine

## 2015-03-17 ENCOUNTER — Emergency Department (HOSPITAL_COMMUNITY): Payer: Medicare Other

## 2015-03-17 ENCOUNTER — Encounter (HOSPITAL_COMMUNITY): Payer: Self-pay | Admitting: Emergency Medicine

## 2015-03-17 DIAGNOSIS — I10 Essential (primary) hypertension: Secondary | ICD-10-CM | POA: Insufficient documentation

## 2015-03-17 DIAGNOSIS — M25552 Pain in left hip: Secondary | ICD-10-CM | POA: Diagnosis not present

## 2015-03-17 DIAGNOSIS — Z79899 Other long term (current) drug therapy: Secondary | ICD-10-CM | POA: Diagnosis not present

## 2015-03-17 DIAGNOSIS — Z853 Personal history of malignant neoplasm of breast: Secondary | ICD-10-CM | POA: Insufficient documentation

## 2015-03-17 DIAGNOSIS — Z7984 Long term (current) use of oral hypoglycemic drugs: Secondary | ICD-10-CM | POA: Insufficient documentation

## 2015-03-17 DIAGNOSIS — M549 Dorsalgia, unspecified: Secondary | ICD-10-CM | POA: Diagnosis present

## 2015-03-17 DIAGNOSIS — E785 Hyperlipidemia, unspecified: Secondary | ICD-10-CM | POA: Diagnosis not present

## 2015-03-17 DIAGNOSIS — F329 Major depressive disorder, single episode, unspecified: Secondary | ICD-10-CM | POA: Diagnosis not present

## 2015-03-17 DIAGNOSIS — Z859 Personal history of malignant neoplasm, unspecified: Secondary | ICD-10-CM | POA: Diagnosis not present

## 2015-03-17 DIAGNOSIS — M25551 Pain in right hip: Secondary | ICD-10-CM | POA: Insufficient documentation

## 2015-03-17 DIAGNOSIS — F419 Anxiety disorder, unspecified: Secondary | ICD-10-CM | POA: Insufficient documentation

## 2015-03-17 DIAGNOSIS — Z862 Personal history of diseases of the blood and blood-forming organs and certain disorders involving the immune mechanism: Secondary | ICD-10-CM | POA: Diagnosis not present

## 2015-03-17 DIAGNOSIS — E119 Type 2 diabetes mellitus without complications: Secondary | ICD-10-CM | POA: Diagnosis not present

## 2015-03-17 DIAGNOSIS — M545 Low back pain, unspecified: Secondary | ICD-10-CM

## 2015-03-17 DIAGNOSIS — Z7982 Long term (current) use of aspirin: Secondary | ICD-10-CM | POA: Diagnosis not present

## 2015-03-17 DIAGNOSIS — Z87891 Personal history of nicotine dependence: Secondary | ICD-10-CM | POA: Insufficient documentation

## 2015-03-17 MED ORDER — ACETAMINOPHEN 500 MG PO TABS
1000.0000 mg | ORAL_TABLET | Freq: Once | ORAL | Status: AC
  Administered 2015-03-17: 1000 mg via ORAL
  Filled 2015-03-17: qty 2

## 2015-03-17 MED ORDER — HYDROCODONE-ACETAMINOPHEN 5-325 MG PO TABS: 1.0000 | ORAL_TABLET | ORAL | Status: DC | PRN

## 2015-03-17 NOTE — ED Provider Notes (Signed)
CSN: [id number]     Arrival date & time 03/17/15  [id number] History  By signing my name below, I, [facility], attest that this documentation has been prepared under the direction and in the presence of Nat Christen, MD. Electronically Signed: Virgel Bouquet, ED Scribe. 03/17/2015. 10:35 AM.   Chief Complaint  Patient presents with  . Back Pain   The history is provided by the patient. No language interpreter was used.   HPI Comments: Stacy [name] is a 82 y.o. female with an hx of cancer and arthritis who presents to the Emergency Department complaining of constant, mild, worse with movement, bilateral lateral hip pain onset 4 days ago. She describes that pain as "lying on bone." Patient endorses mild anterior thigh pain. She notes that the pain is worse with movement and her daughter states that the patient has been shuffling when walking. Per daughter, patient was able to ambulate normally prior to onset of pain. She has taken Tylenol without relief. She denies changes in urination.  Past Medical History  Diagnosis Date  . Diabetes mellitus type II   . Hypertension   . Depression   . Hyperlipemia   . Anxiety   . Anemia   . Cancer ([place])   . Fracture of hand 09/29/12    fell and fractured right hand  . Invasive ductal carcinoma of left breast ([place]) 10/03/2010   Past Surgical History  Procedure Laterality Date  . Appendectomy    . Cataract extraction w/ intraocular lens implant  08/26/10    Right; [facility]  . Incisional breast biopsy  09/05/10    Left, [facility], [facility]  . Cataract extraction w/phaco  10/14/2010    Procedure: CATARACT EXTRACTION PHACO AND INTRAOCULAR LENS PLACEMENT (IOC);  Surgeon: Elta Guadeloupe T. Gershon Crane;  Location: [facility];  Service: Ophthalmology;  Laterality: Left;  CDE:  8.79  . Mastectomy     Family History  Problem Relation Age of Onset  . Anesthesia problems Neg Hx    Social History  Substance Use Topics  . Smoking status: Former Research scientist (life sciences)  . Smokeless tobacco:  Never Used  . Alcohol Use: No   OB History    No data available     Review of Systems A complete 10 system review of systems was obtained and all systems are negative except as noted in the HPI and PMH.    Allergies  Ciprofloxacin; Ferrous sulfate; and Sulfa drugs cross reactors  Home Medications   Prior to Admission medications   Medication Sig Start Date End Date Taking? Authorizing Provider  aspirin 81 MG tablet Take 81 mg by mouth daily.     Yes Historical Provider, MD  Calcium Carbonate-Vitamin D (CALCIUM 600+D) 600-400 MG-UNIT per tablet Take 1 tablet by mouth daily.     Yes Historical Provider, MD  cholecalciferol (VITAMIN D) 1000 UNITS tablet Take 1,000 Units by mouth daily.     Yes Historical Provider, MD  docusate sodium (COLACE) 100 MG capsule Take 100 mg by mouth daily as needed. For constipation    Yes Historical Provider, MD  escitalopram (LEXAPRO) 10 MG tablet Take 10 mg by mouth daily.     Yes Historical Provider, MD  famotidine (PEPCID) 20 MG tablet Take 20 mg by mouth daily.     Yes Historical Provider, MD  Garlic [id number] MG CAPS Take 1 capsule by mouth daily.   Yes Historical Provider, MD  letrozole [facility]) 2.5 MG tablet TAKE 1 TABLET DAILY 03/11/15  Yes [facility]aird [facility],  PA-C  lisinopril-hydrochlorothiazide (PRINZIDE,ZESTORETIC) 20-25 MG per tablet Take 1 tablet by mouth daily.     Yes Historical Provider, MD  metFORMIN (GLUCOPHAGE) 500 MG tablet Take 500 mg by mouth daily with breakfast.    Yes Historical Provider, MD  Multiple Vitamin (MULTIVITAMIN) tablet Take 1 tablet by mouth daily.     Yes Historical Provider, MD  rosuvastatin (CRESTOR) 5 MG tablet Take 5 mg by mouth daily.     Yes Historical Provider, MD  HYDROcodone-acetaminophen (NORCO/VICODIN) 5-325 MG tablet Take 1 tablet by mouth every 4 (four) hours as needed. 03/17/15   Nat Christen, MD   BP 119/74 mmHg  Pulse 79  Temp(Src) 97.4 F (36.3 C) (Oral)  Resp 18  Ht 5' 3.5" (1.613 m)  Wt 200 lb (90.719 kg)   BMI 34.87 kg/m2  SpO2 98% Physical Exam  Constitutional: She is oriented to person, place, and time. She appears well-developed and well-nourished.  HENT:  Head: Normocephalic and atraumatic.  Eyes: Conjunctivae and EOM are normal. Pupils are equal, round, and reactive to light.  Neck: Normal range of motion. Neck supple.  Cardiovascular: Normal rate and regular rhythm.   Pulmonary/Chest: Effort normal and breath sounds normal.  Abdominal: Soft. Bowel sounds are normal.  Musculoskeletal: Normal range of motion.  Minimal bilateral lateral hip tenderness. Minimal tenderness in the anterior left thigh.  Neurological: She is alert and oriented to person, place, and time.  Skin: Skin is warm and dry.  Psychiatric: She has a normal mood and affect. Her behavior is normal.  Nursing note and vitals reviewed.   ED Course  Procedures   DIAGNOSTIC STUDIES: Oxygen Saturation is 98% on RA, normal by my interpretation.    COORDINATION OF CARE: 10:06 AM Will order pelvis x-ray and pain medication. Discussed treatment plan with pt at bedside and pt agreed to plan.   Labs Review Labs Reviewed - No data to display  Imaging Review Dg Pelvis 1-2 Views  03/17/2015  CLINICAL DATA:  Increasing bilateral hip pain since Wednesday, no injury, aching pain from hip to hip posterior and down left leg EXAM: PELVIS - 1-2 VIEW COMPARISON:  CT of 09/05/2010 FINDINGS: AP view of the pelvis demonstrates both sacroiliac joints to be symmetric. Degenerative disc disease at L4-5 and L3-4, worse on the right. Bilateral mild osteoarthritis involves the hip. Weight-bearing surface joint space narrowing and subchondral sclerosis. No acute fracture or dislocation. IMPRESSION: Bilateral mild hip osteoarthritis. Lower lumbar spondylosis/degenerative disc disease. No acute osseous abnormality. Electronically Signed   By: Abigail Miyamoto M.D.   On: 03/17/2015 11:45   I have personally reviewed and evaluated these images and lab  results as part of my medical decision-making.   EKG Interpretation None      MDM   Final diagnoses:  Bilateral low back pain without sciatica    Patient is in no acute distress. Plain films of the pelvis reveal bilateral mild hip osteoarthritis with lower lumbar degenerative disc disease. Recommend ibuprofen initially. Prescription for Vicodin given.  I personally performed the services described in this documentation, which was scribed in my presence. The recorded information has been reviewed and is accurate.     Nat Christen, MD 03/17/15 1250

## 2015-03-17 NOTE — ED Notes (Signed)
Pt reports bilateral lower back pain that radiates from hip to hip. States pain also radiates down front LT leg. States pain increases with movement. Denies urinary symptoms. Pt ambulatory. Denies recent injury/fall.

## 2015-03-17 NOTE — Discharge Instructions (Signed)
X-rays showed bilateral hip arthritis along with degenerative disc disease in lower spine. Recommend ibuprofen 2 tablets 3 times a day. This can be escalated to 3 tablets 3 times a day for a short period of time. Also prescription for a pain tablet. Follow-up your primary care doctor

## 2015-03-22 ENCOUNTER — Telehealth (HOSPITAL_COMMUNITY): Payer: Self-pay | Admitting: *Deleted

## 2015-06-06 ENCOUNTER — Encounter (HOSPITAL_COMMUNITY): Payer: Medicare Other

## 2015-06-06 ENCOUNTER — Encounter (HOSPITAL_COMMUNITY): Payer: Self-pay | Admitting: Hematology & Oncology

## 2015-06-06 ENCOUNTER — Encounter (HOSPITAL_COMMUNITY): Payer: Medicare Other | Attending: Hematology & Oncology | Admitting: Hematology & Oncology

## 2015-06-06 VITALS — BP 124/55 | HR 61 | Temp 97.9°F | Resp 18 | Wt 210.0 lb

## 2015-06-06 DIAGNOSIS — M8580 Other specified disorders of bone density and structure, unspecified site: Secondary | ICD-10-CM

## 2015-06-06 DIAGNOSIS — C50912 Malignant neoplasm of unspecified site of left female breast: Secondary | ICD-10-CM | POA: Diagnosis present

## 2015-06-06 LAB — COMPREHENSIVE METABOLIC PANEL
ALK PHOS: 59 U/L (ref 38–126)
ALT: 22 U/L (ref 14–54)
ANION GAP: 9 (ref 5–15)
AST: 26 U/L (ref 15–41)
Albumin: 4.3 g/dL (ref 3.5–5.0)
BILIRUBIN TOTAL: 0.7 mg/dL (ref 0.3–1.2)
BUN: 34 mg/dL — ABNORMAL HIGH (ref 6–20)
CALCIUM: 10.2 mg/dL (ref 8.9–10.3)
CO2: 25 mmol/L (ref 22–32)
CREATININE: 1.26 mg/dL — AB (ref 0.44–1.00)
Chloride: 105 mmol/L (ref 101–111)
GFR calc non Af Amer: 39 mL/min — ABNORMAL LOW (ref >60)
GFR, EST AFRICAN AMERICAN: 45 mL/min — AB (ref >60)
GLUCOSE: 105 mg/dL — AB (ref 65–99)
Potassium: 4.4 mmol/L (ref 3.5–5.1)
SODIUM: 139 mmol/L (ref 135–145)
TOTAL PROTEIN: 7.3 g/dL (ref 6.5–8.1)

## 2015-06-06 LAB — CBC WITH DIFFERENTIAL/PLATELET
Basophils Absolute: 0 10*3/uL (ref 0.0–0.1)
Basophils Relative: 1 %
Eosinophils Absolute: 0.2 10*3/uL (ref 0.0–0.7)
Eosinophils Relative: 3 %
HEMATOCRIT: 35.9 % — AB (ref 36.0–46.0)
HEMOGLOBIN: 12.1 g/dL (ref 12.0–15.0)
LYMPHS ABS: 1.3 10*3/uL (ref 0.7–4.0)
LYMPHS PCT: 21 %
MCH: 32.1 pg (ref 26.0–34.0)
MCHC: 33.7 g/dL (ref 30.0–36.0)
MCV: 95.2 fL (ref 78.0–100.0)
MONOS PCT: 10 %
Monocytes Absolute: 0.6 10*3/uL (ref 0.1–1.0)
NEUTROS ABS: 4 10*3/uL (ref 1.7–7.7)
NEUTROS PCT: 65 %
Platelets: 185 10*3/uL (ref 150–400)
RBC: 3.77 MIL/uL — AB (ref 3.87–5.11)
RDW: 13.4 % (ref 11.5–15.5)
WBC: 6.1 10*3/uL (ref 4.0–10.5)

## 2015-06-06 MED ORDER — LETROZOLE 2.5 MG PO TABS: 2.5000 mg | ORAL_TABLET | Freq: Every day | ORAL | Status: DC

## 2015-06-06 NOTE — Progress Notes (Signed)
[name], MD No address on file  Stage IIIA, invasive ductal carcinoma of left breast   ER +100%, PR +100%, Ki-67 marker 66% Left modified radical mastectomy on 01/09/2011 XRT Neoadjuvant letrozole  CURRENT THERAPY: Letrozole 2.5 mg daily beginning on 09/19/2010 neoadjuvantly and then restarted in October 2012 following surgery in the adjuvant setting.  INTERVAL HISTORY: Stacy [name] 82 y.o. female returns for followup of advanced stage IIIA, invasive ductal carcinoma of left breast presenting with a large mass, retraction of the nipple, 6 x 6 cm clinically with suspicious lymph nodes in the left axilla presentation. ER +100%, PR +100%, Ki-67 marker 66%, LV I was seen, but was felt to be grade 1. HER-2/neu was nonamplified. We treated her with neoadjuvant letrozole for several months. She then underwent left modified radical mastectomy on 01/09/2011 but still had a 4.8 cm cancer. Clinically there was reduction in the size of the tumor. 4 of 4 lymph nodes were involved. She then completed radiation therapy in Feb 2013 postoperatively and was restarted on the letrozole.    Oncologically, she is doing very well without any complaints and a negative ROS questioning.   Stacy [name] was here alone today.  She says that she feels fine. Her appetite is "too good" and her bowels are okay.   She takes calcium and vitamin D every day.   She has been taking her Letrozole since 03/2011. She will complete therapy next January. She has osteopenia by DEXA.   She is eligible for Prolia and we gave her an information booklet about this.  I ordered her a mammogram for September.   She needs a refill for her Letrozole. Mammogram will be due in September.  Past Medical History  Diagnosis Date  . Diabetes mellitus type II   . Hypertension   . Depression   . Hyperlipemia   . Anxiety   . Anemia   . Cancer ([place])   . Fracture of hand 09/29/12    fell and fractured right hand  . Invasive  ductal carcinoma of left breast ([place]) 10/03/2010    has Invasive ductal carcinoma of left breast ([facility]) and Lymphedema on her problem list.     is allergic to ciprofloxacin; ferrous sulfate; and sulfa drugs cross reactors.  Stacy [name] does not currently have medications on file.  Past Surgical History  Procedure Laterality Date  . Appendectomy    . Cataract extraction w/ intraocular lens implant  08/26/10    Right; [facility]  . Incisional breast biopsy  09/05/10    Left, [facility], [facility]  . Cataract extraction w/phaco  10/14/2010    Procedure: CATARACT EXTRACTION PHACO AND INTRAOCULAR LENS PLACEMENT (IOC);  Surgeon: Elta Guadeloupe T. Gershon Crane;  Location: [facility];  Service: Ophthalmology;  Laterality: Left;  CDE:  8.79  . Mastectomy      Denies any headaches, dizziness, double vision, fevers, chills, night sweats, nausea, vomiting, diarrhea, constipation, chest pain, heart palpitations, shortness of breath, blood in stool, black tarry stool, urinary pain, urinary burning, urinary frequency, hematuria. 14 point review of systems was performed and is negative except as detailed under history of present illness and above    PHYSICAL EXAMINATION  ECOG PERFORMANCE STATUS: 0 - Asymptomatic   Filed Vitals:   06/06/15 1100  BP: 124/55  Pulse: 61  Temp: 97.9 F (36.6 C)  Resp: 18    GENERAL:alert, no distress, well nourished, well developed, comfortable, cooperative, obese and smiling SKIN: skin color, texture,  turgor are normal, no rashes or significant lesions HEAD: Normocephalic, No masses, lesions, tenderness or abnormalities EYES: normal, PERRLA, EOMI, Conjunctiva are pink and non-injected EARS: External ears normal OROPHARYNX:lips, buccal mucosa, and tongue normal and mucous membranes are moist  NECK: supple, no adenopathy, thyroid normal size, non-tender, without nodularity, no stridor, non-tender, trachea midline LYMPH:  no palpable lymphadenopathy, no hepatosplenomegaly BREAST:right breast  normal without mass, skin or nipple changes or axillary nodes, left post-mastectomy site well healed and free of suspicious changes both axillary regions are free of adenopathy LUNGS: clear to auscultation and percussion HEART: regular rate & rhythm, no murmurs, no gallops, S1 normal and S2 normal ABDOMEN:abdomen soft, non-tender, obese, normal bowel sounds and no masses or organomegaly BACK: Back symmetric, no curvature., No CVA tenderness EXTREMITIES:less then 2 second capillary refill, no joint deformities, effusion, or inflammation, no edema, no skin discoloration, no clubbing, no cyanosis     Left ankle edema noted. NEURO: alert & oriented x 3 with fluent speech, no focal motor/sensory deficits, gait normal   LABORATORY DATA: I have reviewed the labs below. CBC    Component Value Date/Time   WBC 6.1 06/06/2015 1205   RBC 3.77* 06/06/2015 1205   HGB 12.1 06/06/2015 1205   HCT 35.9* 06/06/2015 1205   PLT 185 06/06/2015 1205   MCV 95.2 06/06/2015 1205   MCH 32.1 06/06/2015 1205   MCHC 33.7 06/06/2015 1205   RDW 13.4 06/06/2015 1205   LYMPHSABS 1.3 06/06/2015 1205   MONOABS 0.6 06/06/2015 1205   EOSABS 0.2 06/06/2015 1205   BASOSABS 0.0 06/06/2015 1205      Chemistry      Component Value Date/Time   NA 139 06/06/2015 1205   K 4.4 06/06/2015 1205   CL 105 06/06/2015 1205   CO2 25 06/06/2015 1205   BUN 34* 06/06/2015 1205   CREATININE 1.26* 06/06/2015 1205      Component Value Date/Time   CALCIUM 10.2 06/06/2015 1205   ALKPHOS 59 06/06/2015 1205   AST 26 06/06/2015 1205   ALT 22 06/06/2015 1205   BILITOT 0.7 06/06/2015 1205     RADIOGRAPHIC STUDIES:  CLINICAL DATA: Screening.  EXAM: DIGITAL SCREENING UNILATERAL RIGHT MAMMOGRAM WITH TOMO AND CAD  COMPARISON: Previous exam(s).  ACR Breast Density Category b: There are scattered areas of fibroglandular density.  FINDINGS: The patient has had a left mastectomy. There are no findings suspicious for  malignancy.  IMPRESSION: No mammographic evidence of malignancy. A result letter of this screening mammogram will be mailed directly to the patient.  RECOMMENDATION: Screening mammogram in one year. ([id number])  BI-RADS CATEGORY 1: Negative.   Electronically Signed  By: Conchita Paris M.D.  On: 12/11/2014 12:42       ASSESSMENT AND PLAN:  Stage IIIA, invasive ductal carcinoma of left breast  ER +100%, PR +100%, Ki-67 marker 66% Left modified radical mastectomy on 01/09/2011 XRT Neoadjuvant letrozole, with ongoing AI therapy DEXA 09/24/2014 with osteopenia Calcium and Vitamin D   She is doing well without evidence of recurrence.  She is not interested in pursuing prolia or bisphosphonate therapy at this time. We discussed her DEXA and ongoing AI use, risks and benefits in detail. She takes calcium plus D daily. We discussed extending endocrine therapy for 10 years and the controversy surrounding this, she does not qualify for BCI testing.   I will see Stacy [name] again in 6 months for routine follow up and breast exam.  THERAPY PLAN:  NCCN guidelines recommends the following surveillance  for invasive breast cancer:  A. History and Physical exam every 4-6 months for 5 years and then every 12 months.  B. Mammography every 12 months  C. Women on Tamoxifen: annual gynecologic assessment every 12 months if uterus is present.  D. Women on aromatase inhibitor or who experience ovarian failure secondary to treatment should have monitoring of bone health with a bone mineral density determination at baseline and periodically thereafter.  E. Assess and encourage adherence to adjuvant endocrine therapy.  F. Evidence suggests that active lifestyle and achieving and maintaining an ideal body weight (20-25 BMI) may lead to optimal breast cancer outcomes.   I ordered her a mammogram for September.   She needs a refill for her Letrozole.  All questions were answered. The patient  knows to call the clinic with any problems, questions or concerns. We can certainly see the patient much sooner if necessary.  This document serves as a record of services personally performed by Ancil Linsey, MD. It was created on her behalf by Kandace Blitz, a trained medical scribe. The creation of this record is based on the scribe's personal observations and the provider's statements to them. This document has been checked and approved by the attending provider.  I have reviewed the above documentation for accuracy and completeness, and I agree with the above.  Kelby Fam. Penland, MD  06/06/2015

## 2015-06-06 NOTE — Patient Instructions (Addendum)
[place] at [facility] Discharge Instructions  RECOMMENDATIONS MADE BY THE CONSULTANT AND ANY TEST RESULTS WILL BE SENT TO YOUR REFERRING PHYSICIAN.   Exam and discussion by Dr Whitney Muse today Labs today, just stop by and check in at the main desk. Make sure you are taking your calcium with Vitamin D Prolia you can take for your bones, this is done twice a year. Extend to 10 years on your endocrine therapy  Mammogram in September  Return to see the doctor in 6 months with labs  Please call the clinic if you have any questions or concerns     Denosumab injection What is this medicine? DENOSUMAB (den oh sue mab) slows bone breakdown. Prolia is used to treat osteoporosis in women after menopause and in men. Delton See is used to prevent bone fractures and other bone problems caused by cancer bone metastases. Delton See is also used to treat giant cell tumor of the bone. This medicine may be used for other purposes; ask your health care provider or pharmacist if you have questions. What should I tell my health care provider before I take this medicine? They need to know if you have any of these conditions: -dental disease -eczema -infection or history of infections -kidney disease or on dialysis -low blood calcium or vitamin D -malabsorption syndrome -scheduled to have surgery or tooth extraction -taking medicine that contains denosumab -thyroid or parathyroid disease -an unusual reaction to denosumab, other medicines, foods, dyes, or preservatives -pregnant or trying to get pregnant -breast-feeding How should I use this medicine? This medicine is for injection under the skin. It is given by a health care professional in a hospital or clinic setting. If you are getting Prolia, a special MedGuide will be given to you by the pharmacist with each prescription and refill. Be sure to read this information carefully each time. For Prolia, talk to your pediatrician regarding  the use of this medicine in children. Special care may be needed. For Delton See, talk to your pediatrician regarding the use of this medicine in children. While this drug may be prescribed for children as young as 13 years for selected conditions, precautions do apply. Overdosage: If you think you have taken too much of this medicine contact a poison control center or emergency room at once. NOTE: This medicine is only for you. Do not share this medicine with others. What if I miss a dose? It is important not to miss your dose. Call your doctor or health care professional if you are unable to keep an appointment. What may interact with this medicine? Do not take this medicine with any of the following medications: -other medicines containing denosumab This medicine may also interact with the following medications: -medicines that suppress the immune system -medicines that treat cancer -steroid medicines like prednisone or cortisone This list may not describe all possible interactions. Give your health care provider a list of all the medicines, herbs, non-prescription drugs, or dietary supplements you use. Also tell them if you smoke, drink alcohol, or use illegal drugs. Some items may interact with your medicine. What should I watch for while using this medicine? Visit your doctor or health care professional for regular checks on your progress. Your doctor or health care professional may order blood tests and other tests to see how you are doing. Call your doctor or health care professional if you get a cold or other infection while receiving this medicine. Do not treat yourself. This medicine may decrease your  body's ability to fight infection. You should make sure you get enough calcium and vitamin D while you are taking this medicine, unless your doctor tells you not to. Discuss the foods you eat and the vitamins you take with your health care professional. See your dentist regularly. Brush and floss  your teeth as directed. Before you have any dental work done, tell your dentist you are receiving this medicine. Do not become pregnant while taking this medicine or for 5 months after stopping it. Women should inform their doctor if they wish to become pregnant or think they might be pregnant. There is a potential for serious side effects to an unborn child. Talk to your health care professional or pharmacist for more information. What side effects may I notice from receiving this medicine? Side effects that you should report to your doctor or health care professional as soon as possible: -allergic reactions like skin rash, itching or hives, swelling of the face, lips, or tongue -breathing problems -chest pain -fast, irregular heartbeat -feeling faint or lightheaded, falls -fever, chills, or any other sign of infection -muscle spasms, tightening, or twitches -numbness or tingling -skin blisters or bumps, or is dry, peels, or red -slow healing or unexplained pain in the mouth or jaw -unusual bleeding or bruising Side effects that usually do not require medical attention (Report these to your doctor or health care professional if they continue or are bothersome.): -muscle pain -stomach upset, gas This list may not describe all possible side effects. Call your doctor for medical advice about side effects. You may report side effects to FDA at 1-800-FDA-1088. Where should I keep my medicine? This medicine is only given in a clinic, doctor's office, or other health care setting and will not be stored at home. NOTE: This sheet is a summary. It may not cover all possible information. If you have questions about this medicine, talk to your doctor, pharmacist, or health care provider.    2016, Elsevier/Gold Standard. (2011-08-31 12:37:47)    Talking points:Prolia   These medications can cause osteonecrosis of the jaw which symptomatically can present as jaw pain/tenderness.  If you develop jaw  pain/tenderness/soreness, a nurse at the [facility] be contacted.  [place] may go to the dentist to have your teeth cleaned or to have a filling without notifying us.  However, if you require more dental work than this you MUST contact a nurse at the [facility] prior to making your dental appointment.  This is for your safety!  You MUST notify your dental hygienist/dentist that you are on this medication.   You will need to take calcium 1200mg  and vitamin D 1000units every day.  You can take a [place] with D that is equivalent to this dose or take separate tablets to equal this dosage.       Thank you for choosing [place] at [facility] to provide your oncology and hematology care.  To afford each patient quality time with our provider, please arrive at least 15 minutes before your scheduled appointment time.   Beginning January 23rd 2017 lab work for the [facility] will be done in the  Main lab at [facility] on 1st floor. If you have a lab appointment with the [facility] please come in thru the  Main Entrance and check in at the main information desk  You need to re-schedule your appointment should you arrive 10 or more minutes late.  We strive to give  you quality time with our providers, and arriving late affects you and other patients whose appointments are after yours.  Also, if you no show three or more times for appointments you may be dismissed from the clinic at the providers discretion.     Again, thank you for choosing [facility].  Our hope is that these requests will decrease the amount of time that you wait before being seen by our physicians.       _____________________________________________________________  Should you have questions after your visit to [facility], please contact our office at [phone] between the hours of 8:30 a.m. and 4:30 p.m.  Voicemails left after 4:30 p.m. will not be returned  until the following business day.  For prescription refill requests, have your pharmacy contact our office.         Resources For Cancer Patients and their Caregivers ? American Cancer Society: Can assist with transportation, wigs, general needs, runs Look Good Feel Better.        5194020546 ? Cancer Care: Provides financial assistance, online support groups, medication/co-pay assistance.  1-800-813-HOPE (684) 828-3229) ? [facility] Assists [place] cancer patients and their families through emotional , educational and financial support.  (434)706-6900 ? [place] DSS Where to apply for food stamps, Medicaid and utility assistance. 220-709-1228 ? [facility]: Transportation to medical appointments. 708 194 1988 ? Social Security Administration: May apply for disability if have a Stage IV cancer. (919) 364-3086 518 220 2770 ? [employer], Disability and Transit Services: Assists with nutrition, care and transit needs. 703-384-8706

## 2015-07-19 ENCOUNTER — Encounter (HOSPITAL_COMMUNITY): Payer: Self-pay | Admitting: Hematology & Oncology

## 2015-08-25 ENCOUNTER — Encounter (HOSPITAL_COMMUNITY): Payer: Self-pay | Admitting: *Deleted

## 2015-08-25 ENCOUNTER — Emergency Department (HOSPITAL_COMMUNITY)
Admission: EM | Admit: 2015-08-25 | Discharge: 2015-08-25 | Disposition: A | Discharge status: Home / Self Care | Payer: Medicare Other | Attending: Emergency Medicine | Admitting: Emergency Medicine

## 2015-08-25 DIAGNOSIS — Z7982 Long term (current) use of aspirin: Secondary | ICD-10-CM | POA: Diagnosis not present

## 2015-08-25 DIAGNOSIS — Z859 Personal history of malignant neoplasm, unspecified: Secondary | ICD-10-CM | POA: Diagnosis not present

## 2015-08-25 DIAGNOSIS — Z7984 Long term (current) use of oral hypoglycemic drugs: Secondary | ICD-10-CM | POA: Diagnosis not present

## 2015-08-25 DIAGNOSIS — Z87891 Personal history of nicotine dependence: Secondary | ICD-10-CM | POA: Insufficient documentation

## 2015-08-25 DIAGNOSIS — L259 Unspecified contact dermatitis, unspecified cause: Secondary | ICD-10-CM | POA: Insufficient documentation

## 2015-08-25 DIAGNOSIS — E785 Hyperlipidemia, unspecified: Secondary | ICD-10-CM | POA: Insufficient documentation

## 2015-08-25 DIAGNOSIS — I10 Essential (primary) hypertension: Secondary | ICD-10-CM | POA: Diagnosis not present

## 2015-08-25 DIAGNOSIS — R21 Rash and other nonspecific skin eruption: Secondary | ICD-10-CM | POA: Diagnosis present

## 2015-08-25 DIAGNOSIS — Z853 Personal history of malignant neoplasm of breast: Secondary | ICD-10-CM | POA: Insufficient documentation

## 2015-08-25 DIAGNOSIS — E119 Type 2 diabetes mellitus without complications: Secondary | ICD-10-CM | POA: Diagnosis not present

## 2015-08-25 DIAGNOSIS — M791 Myalgia: Secondary | ICD-10-CM | POA: Diagnosis not present

## 2015-08-25 DIAGNOSIS — F329 Major depressive disorder, single episode, unspecified: Secondary | ICD-10-CM | POA: Diagnosis not present

## 2015-08-25 DIAGNOSIS — Z79899 Other long term (current) drug therapy: Secondary | ICD-10-CM | POA: Insufficient documentation

## 2015-08-25 MED ORDER — PREDNISONE 50 MG PO TABS: ORAL_TABLET | ORAL | Status: DC

## 2015-08-25 NOTE — ED Provider Notes (Signed)
CSN: [id number]     Arrival date & time 08/25/15  0914 History   First MD Initiated Contact with Patient 08/25/15 1030     Chief Complaint  Patient presents with  . Rash     (Consider location/radiation/quality/duration/timing/severity/associated sxs/prior Treatment) Patient is a 82 y.o. female presenting with rash. The history is provided by the patient. No language interpreter was used.  Rash Location:  Leg Leg rash location:  L lower leg and R lower leg Quality: redness   Quality: not blistering   Severity:  Moderate Onset quality:  Gradual Duration:  1 day Timing:  Constant Progression:  Worsening Context: not plant contact and not sick contacts   Relieved by:  Nothing Worsened by:  Nothing tried Ineffective treatments:  None tried Associated symptoms: myalgias   Pt has bilat lower extremity redness  Past Medical History  Diagnosis Date  . Diabetes mellitus type II   . Hypertension   . Depression   . Hyperlipemia   . Anxiety   . Anemia   . Cancer ([place])   . Fracture of hand 09/29/12    fell and fractured right hand  . Invasive ductal carcinoma of left breast ([place]) 10/03/2010   Past Surgical History  Procedure Laterality Date  . Appendectomy    . Cataract extraction w/ intraocular lens implant  08/26/10    Right; [facility]  . Incisional breast biopsy  09/05/10    Left, [facility], [place]  . Cataract extraction w/phaco  10/14/2010    Procedure: CATARACT EXTRACTION PHACO AND INTRAOCULAR LENS PLACEMENT (IOC);  Surgeon: Elta Guadeloupe T. Gershon Crane;  Location: [facility];  Service: Ophthalmology;  Laterality: Left;  CDE:  8.79  . Mastectomy     Family History  Problem Relation Age of Onset  . Anesthesia problems Neg Hx    Social History  Substance Use Topics  . Smoking status: Former Research scientist (life sciences)  . Smokeless tobacco: Never Used  . Alcohol Use: No   OB History    No data available     Review of Systems  Musculoskeletal: Positive for myalgias.  Skin: Positive for rash.  All other  systems reviewed and are negative.     Allergies  Ciprofloxacin; Ferrous sulfate; and Sulfa drugs cross reactors  Home Medications   Prior to Admission medications   Medication Sig Start Date End Date Taking? Authorizing Provider  aspirin 81 MG tablet Take 81 mg by mouth daily.      Historical Provider, MD  Calcium Carbonate-Vitamin D (CALCIUM 600+D) 600-400 MG-UNIT per tablet Take 1 tablet by mouth daily.      Historical Provider, MD  cholecalciferol (VITAMIN D) 1000 UNITS tablet Take 1,000 Units by mouth daily.      Historical Provider, MD  docusate sodium (COLACE) 100 MG capsule Take 100 mg by mouth daily as needed. For constipation     Historical Provider, MD  escitalopram (LEXAPRO) 10 MG tablet Take 10 mg by mouth daily.      Historical Provider, MD  famotidine (PEPCID) 20 MG tablet Take 20 mg by mouth daily.      Historical Provider, MD  Garlic 123XX123 MG CAPS Take 1 capsule by mouth daily.    Historical Provider, MD  HYDROcodone-acetaminophen (NORCO/VICODIN) 5-325 MG tablet Take 1 tablet by mouth every 4 (four) hours as needed. 03/17/15   Nat Christen, MD  letrozole [facility]) 2.5 MG tablet Take 1 tablet (2.5 mg total) by mouth daily. 06/06/15   Patrici Ranks, MD  lisinopril-hydrochlorothiazide (PRINZIDE,ZESTORETIC) 20-25 MG per  tablet Take 1 tablet by mouth daily.      Historical Provider, MD  metFORMIN (GLUCOPHAGE) 500 MG tablet Take 500 mg by mouth daily with breakfast.     Historical Provider, MD  Multiple Vitamin (MULTIVITAMIN) tablet Take 1 tablet by mouth daily.      Historical Provider, MD  rosuvastatin (CRESTOR) 5 MG tablet Take 5 mg by mouth daily.      Historical Provider, MD   BP 137/71 mmHg  Pulse 69  Temp(Src) 98.1 F (36.7 C) (Oral)  Resp 18  Ht 5\' 3"  (1.6 m)  Wt 90.719 kg  BMI 35.44 kg/m2  SpO2 97% Physical Exam  Constitutional: She is oriented to person, place, and time. She appears well-developed and well-nourished.  HENT:  Head: Normocephalic.  Eyes: EOM are  normal.  Neck: Normal range of motion.  Cardiovascular: Normal rate.   Pulmonary/Chest: Effort normal.  Abdominal: She exhibits no distension.  Musculoskeletal: Normal range of motion.  Neurological: She is alert and oriented to person, place, and time.  Skin: There is erythema.  bilat lower leg redness,  Well demarcated.  Looks allergic   Psychiatric: She has a normal mood and affect.  Nursing note and vitals reviewed.   ED Course  Procedures (including critical care time) Labs Review Labs Reviewed - No data to display  Imaging Review No results found. I have personally reviewed and evaluated these images and lab results as part of my medical decision-making.   EKG Interpretation None      MDM   Final diagnoses:  Contact dermatitis    Prednisone 50 mg x 5 days An After Visit Summary was printed and given to the patient.    Hollace Kinnier [place], PA-C 08/25/15 [place], MD 08/25/15 [phone]

## 2015-08-25 NOTE — Discharge Instructions (Signed)
Contact Dermatitis Dermatitis is redness, soreness, and swelling (inflammation) of the skin. Contact dermatitis is a reaction to certain substances that touch the skin. There are two types of contact dermatitis:   Irritant contact dermatitis. This type is caused by something that irritates your skin, such as dry hands from washing them too much. This type does not require previous exposure to the substance for a reaction to occur. This type is more common.  Allergic contact dermatitis. This type is caused by a substance that you are allergic to, such as a nickel allergy or poison ivy. This type only occurs if you have been exposed to the substance (allergen) before. Upon a repeat exposure, your body reacts to the substance. This type is less common. CAUSES  Many different substances can cause contact dermatitis. Irritant contact dermatitis is most commonly caused by exposure to:   Makeup.   Soaps.   Detergents.   Bleaches.   Acids.   Metal salts, such as nickel.  Allergic contact dermatitis is most commonly caused by exposure to:   Poisonous plants.   Chemicals.   Jewelry.   Latex.   Medicines.   Preservatives in products, such as clothing.  RISK FACTORS This condition is more likely to develop in:   People who have jobs that expose them to irritants or allergens.  People who have certain medical conditions, such as asthma or eczema.  SYMPTOMS  Symptoms of this condition may occur anywhere on your body where the irritant has touched you or is touched by you. Symptoms include:  Dryness or flaking.   Redness.   Cracks.   Itching.   Pain or a burning feeling.   Blisters.  Drainage of small amounts of blood or clear fluid from skin cracks. With allergic contact dermatitis, there may also be swelling in areas such as the eyelids, mouth, or genitals.  DIAGNOSIS  This condition is diagnosed with a medical history and physical exam. A patch skin test  may be performed to help determine the cause. If the condition is related to your job, you may need to see an occupational medicine specialist. TREATMENT Treatment for this condition includes figuring out what caused the reaction and protecting your skin from further contact. Treatment may also include:   Steroid creams or ointments. Oral steroid medicines may be needed in more severe cases.  Antibiotics or antibacterial ointments, if a skin infection is present.  Antihistamine lotion or an antihistamine taken by mouth to ease itching.  A bandage (dressing). HOME CARE INSTRUCTIONS Skin Care  Moisturize your skin as needed.   Apply cool compresses to the affected areas.  Try taking a bath with:  Epsom salts. Follow the instructions on the packaging. You can get these at your local pharmacy or grocery store.  Baking soda. Pour a small amount into the bath as directed by your health care provider.  Colloidal oatmeal. Follow the instructions on the packaging. You can get this at your local pharmacy or grocery store.  Try applying baking soda paste to your skin. Stir water into baking soda until it reaches a paste-like consistency.  Do not scratch your skin.  Bathe less frequently, such as every other day.  Bathe in lukewarm water. Avoid using hot water. Medicines  Take or apply over-the-counter and prescription medicines only as told by your health care provider.   If you were prescribed an antibiotic medicine, take or apply your antibiotic as told by your health care provider. Do not stop using the   antibiotic even if your condition starts to improve. General Instructions  Keep all follow-up visits as told by your health care provider. This is important.  Avoid the substance that caused your reaction. If you do not know what caused it, keep a journal to try to track what caused it. Write down:  What you eat.  What cosmetic products you use.  What you drink.  What  you wear in the affected area. This includes jewelry.  If you were given a dressing, take care of it as told by your health care provider. This includes when to change and remove it. SEEK MEDICAL CARE IF:   Your condition does not improve with treatment.  Your condition gets worse.  You have signs of infection such as swelling, tenderness, redness, soreness, or warmth in the affected area.  You have a fever.  You have new symptoms. SEEK IMMEDIATE MEDICAL CARE IF:   You have a severe headache, neck pain, or neck stiffness.  You vomit.  You feel very sleepy.  You notice red streaks coming from the affected area.  Your bone or joint underneath the affected area becomes painful after the skin has healed.  The affected area turns darker.  You have difficulty breathing.   This information is not intended to replace advice given to you by your health care provider. Make sure you discuss any questions you have with your health care provider.   Document Released: 02/28/2000 Document Revised: 11/21/2014 Document Reviewed: 07/18/2014 Elsevier Interactive Patient Education 2016 Elsevier Inc.  

## 2015-08-25 NOTE — ED Notes (Signed)
Bilateral medial lower leg reddness with itching and burning starting yesterday.

## 2015-12-05 ENCOUNTER — Other Ambulatory Visit (HOSPITAL_COMMUNITY): Payer: Self-pay | Admitting: Hematology & Oncology

## 2015-12-12 ENCOUNTER — Ambulatory Visit (HOSPITAL_COMMUNITY)
Admission: RE | Admit: 2015-12-12 | Discharge: 2015-12-12 | Disposition: A | Discharge status: Home / Self Care | Payer: Medicare Other | Source: Ambulatory Visit | Attending: Oncology | Admitting: Oncology

## 2015-12-12 ENCOUNTER — Ambulatory Visit (HOSPITAL_COMMUNITY): Payer: Medicare Other

## 2015-12-12 ENCOUNTER — Other Ambulatory Visit (HOSPITAL_COMMUNITY): Payer: Self-pay | Admitting: Oncology

## 2015-12-12 DIAGNOSIS — Z1231 Encounter for screening mammogram for malignant neoplasm of breast: Secondary | ICD-10-CM | POA: Diagnosis present

## 2015-12-12 DIAGNOSIS — Z9012 Acquired absence of left breast and nipple: Secondary | ICD-10-CM | POA: Diagnosis not present

## 2015-12-13 ENCOUNTER — Other Ambulatory Visit (HOSPITAL_COMMUNITY): Payer: Medicare Other

## 2015-12-13 ENCOUNTER — Ambulatory Visit (HOSPITAL_COMMUNITY): Payer: Medicare Other | Admitting: Oncology

## 2015-12-18 ENCOUNTER — Encounter (HOSPITAL_COMMUNITY): Payer: Medicare Other

## 2015-12-18 ENCOUNTER — Encounter (HOSPITAL_COMMUNITY): Payer: Medicare Other | Attending: Oncology | Admitting: Oncology

## 2015-12-18 ENCOUNTER — Encounter (HOSPITAL_COMMUNITY): Payer: Self-pay | Admitting: Oncology

## 2015-12-18 DIAGNOSIS — E119 Type 2 diabetes mellitus without complications: Secondary | ICD-10-CM | POA: Diagnosis not present

## 2015-12-18 DIAGNOSIS — F419 Anxiety disorder, unspecified: Secondary | ICD-10-CM | POA: Insufficient documentation

## 2015-12-18 DIAGNOSIS — C50912 Malignant neoplasm of unspecified site of left female breast: Secondary | ICD-10-CM

## 2015-12-18 DIAGNOSIS — Z923 Personal history of irradiation: Secondary | ICD-10-CM | POA: Diagnosis not present

## 2015-12-18 DIAGNOSIS — Z87891 Personal history of nicotine dependence: Secondary | ICD-10-CM | POA: Diagnosis not present

## 2015-12-18 DIAGNOSIS — C773 Secondary and unspecified malignant neoplasm of axilla and upper limb lymph nodes: Secondary | ICD-10-CM | POA: Diagnosis not present

## 2015-12-18 DIAGNOSIS — D649 Anemia, unspecified: Secondary | ICD-10-CM

## 2015-12-18 DIAGNOSIS — N6453 Retraction of nipple: Secondary | ICD-10-CM | POA: Diagnosis not present

## 2015-12-18 DIAGNOSIS — Z170 Estrogen receptor positive status [ER+]: Secondary | ICD-10-CM | POA: Diagnosis not present

## 2015-12-18 DIAGNOSIS — Z9889 Other specified postprocedural states: Secondary | ICD-10-CM | POA: Insufficient documentation

## 2015-12-18 DIAGNOSIS — Z9012 Acquired absence of left breast and nipple: Secondary | ICD-10-CM | POA: Insufficient documentation

## 2015-12-18 DIAGNOSIS — F329 Major depressive disorder, single episode, unspecified: Secondary | ICD-10-CM | POA: Insufficient documentation

## 2015-12-18 DIAGNOSIS — E785 Hyperlipidemia, unspecified: Secondary | ICD-10-CM | POA: Diagnosis not present

## 2015-12-18 DIAGNOSIS — I10 Essential (primary) hypertension: Secondary | ICD-10-CM | POA: Diagnosis not present

## 2015-12-18 LAB — COMPREHENSIVE METABOLIC PANEL
ALT: 24 U/L (ref 14–54)
ANION GAP: 8 (ref 5–15)
AST: 25 U/L (ref 15–41)
Albumin: 4.1 g/dL (ref 3.5–5.0)
Alkaline Phosphatase: 59 U/L (ref 38–126)
BUN: 30 mg/dL — ABNORMAL HIGH (ref 6–20)
CHLORIDE: 103 mmol/L (ref 101–111)
CO2: 25 mmol/L (ref 22–32)
Calcium: 10.4 mg/dL — ABNORMAL HIGH (ref 8.9–10.3)
Creatinine, Ser: 1.43 mg/dL — ABNORMAL HIGH (ref 0.44–1.00)
GFR calc non Af Amer: 33 mL/min — ABNORMAL LOW (ref >60)
GFR, EST AFRICAN AMERICAN: 38 mL/min — AB (ref >60)
Glucose, Bld: 110 mg/dL — ABNORMAL HIGH (ref 65–99)
POTASSIUM: 4.5 mmol/L (ref 3.5–5.1)
SODIUM: 136 mmol/L (ref 135–145)
Total Bilirubin: 0.5 mg/dL (ref 0.3–1.2)
Total Protein: 7.2 g/dL (ref 6.5–8.1)

## 2015-12-18 LAB — CBC WITH DIFFERENTIAL/PLATELET
Basophils Absolute: 0 10*3/uL (ref 0.0–0.1)
Basophils Relative: 1 %
EOS ABS: 0.2 10*3/uL (ref 0.0–0.7)
EOS PCT: 4 %
HCT: 35 % — ABNORMAL LOW (ref 36.0–46.0)
Hemoglobin: 11.6 g/dL — ABNORMAL LOW (ref 12.0–15.0)
LYMPHS ABS: 1.1 10*3/uL (ref 0.7–4.0)
Lymphocytes Relative: 18 %
MCH: 31.3 pg (ref 26.0–34.0)
MCHC: 33.1 g/dL (ref 30.0–36.0)
MCV: 94.3 fL (ref 78.0–100.0)
MONOS PCT: 10 %
Monocytes Absolute: 0.6 10*3/uL (ref 0.1–1.0)
Neutro Abs: 3.9 10*3/uL (ref 1.7–7.7)
Neutrophils Relative %: 67 %
PLATELETS: 174 10*3/uL (ref 150–400)
RBC: 3.71 MIL/uL — AB (ref 3.87–5.11)
RDW: 13.5 % (ref 11.5–15.5)
WBC: 5.9 10*3/uL (ref 4.0–10.5)

## 2015-12-18 NOTE — Assessment & Plan Note (Addendum)
Stage IIIA, invasive ductal carcinoma of left breast presenting with a large mass, retraction of the nipple, 6 x 6 cm clinically with suspicious lymph nodes in the left axilla presentation. ER +100%, PR +100%, Ki-67 marker 66%, LV I was seen, but was felt to be grade 1. HER-2/neu was nonamplified. We treated her with neoadjuvant letrozole for several months. She then underwent left modified radical mastectomy on 01/09/2011 but still had a 4.8 cm cancer. Clinically there was reduction in the size of the tumor. 4 of 4 lymph nodes were involved. She then completed radiation therapy in Feb 2013 postoperatively and was restarted on the letrozole.  She is not a candidate for BCI testing given her nodal status.  Labs today: CBC diff, CMET.  I personally reviewed and went over laboratory results with the patient.  The results are noted within this dictation.  Minimal anemia is noted.  Will need to follow moving forward.  If worse at next lab check, then peripheral work-up for anemia would be indicated.  Mild hypercalcemia is noted.  Her primary care provider has discontinued her Ca++ supplementation since November 15, 2015.  Labs in 6 months: CBC diff, CMET.    Patient reports recent breast exam from her primary care provider.  I personally reviewed and went over radiographic studies with the patient.  The results are noted within this dictation.  Mammogram is negative.  She will be due in October 2018.  Bone density will be due in July 2018.  Return in 6 months for follow-up.

## 2015-12-18 NOTE — Progress Notes (Signed)
[place] [address] [address] [place] [address]  Invasive ductal carcinoma of left breast ([place]) - Plan: Atorvastatin Calcium (LIPITOR PO), CBC with Differential, Comprehensive metabolic panel  CURRENT THERAPY: Letrozole 2.5 mg daily beginning on 09/19/2010 neoadjuvantly and then restarted in October 2012 following surgery in the adjuvant setting.  INTERVAL HISTORY: Stacy [name] 82 y.o. female returns for followup of advanced stage IIIA, invasive ductal carcinoma of left breast presenting with a large mass, retraction of the nipple, 6 x 6 cm clinically with suspicious lymph nodes in the left axilla presentation. ER +100%, PR +100%, Ki-67 marker 66%, LV I was seen, but was felt to be grade 1. HER-2/neu was nonamplified. We treated her with neoadjuvant letrozole for several months. She then underwent left modified radical mastectomy on 01/09/2011 but still had a 4.8 cm cancer. Clinically there was reduction in the size of the tumor. 4 of 4 lymph nodes were involved. She then completed radiation therapy in Feb 2013 postoperatively and was restarted on the letrozole.     She reports compliance with her letrozole.  She denies any complaints associated with side effects of AI therapy.  She recently stopped her Ca++ at the recommendation of her primary care provider given her high-normal calcium level recently.  She notes that she stopped Ca++ on 11/15/2015.  She is up to date on mammogram.  She denies any breast changes including masses, nodules, lesions, skin changes, nipple changes, nipple discharge.  She notes that her primary care provider performed a breast exam within the last few weeks.  She had to admit her husband to SNF due to his worsening dementia.  Review of Systems  Constitutional: Negative.  Negative for chills, fever and weight loss.  HENT: Negative.   Eyes: Negative.   Respiratory: Negative.  Negative for cough and hemoptysis.   Cardiovascular: Negative.  Negative  for chest pain.  Gastrointestinal: Negative.  Negative for abdominal pain.  Genitourinary: Negative.  Negative for dysuria.  Musculoskeletal: Negative.   Skin: Negative.  Negative for rash.  Neurological: Negative.  Negative for weakness.  Endo/Heme/Allergies: Negative.   Psychiatric/Behavioral: Negative.     Past Medical History:  Diagnosis Date  . Anemia   . Anxiety   . Cancer ([place])   . Depression   . Diabetes mellitus type II   . Fracture of hand 09/29/12   fell and fractured right hand  . Hyperlipemia   . Hypertension   . Invasive ductal carcinoma of left breast ([place]) 10/03/2010    Past Surgical History:  Procedure Laterality Date  . APPENDECTOMY    . CATARACT EXTRACTION W/ INTRAOCULAR LENS IMPLANT  08/26/10   Right; [facility]  . CATARACT EXTRACTION W/PHACO  10/14/2010   Procedure: CATARACT EXTRACTION PHACO AND INTRAOCULAR LENS PLACEMENT ([facility]);  Surgeon: Elta Guadeloupe T. [facility];  Location: [facility];  Service: Ophthalmology;  Laterality: Left;  CDE:  8.79  . INCISIONAL BREAST BIOPSY  09/05/10   Left, [facility], [place]  . MASTECTOMY      Family History  Problem Relation Age of Onset  . Anesthesia problems Neg Hx     Social History   Social History  . Marital status: Married    Spouse name: N/A  . Number of children: N/A  . Years of education: N/A   Social History Main Topics  . Smoking status: Former Research scientist (life sciences)  . Smokeless tobacco: Never Used  . Alcohol use No  . Drug use: No  . Sexual  activity: Not Currently   Other Topics Concern  . None   Social History Narrative  . None     PHYSICAL EXAMINATION  ECOG PERFORMANCE STATUS: 0 - Asymptomatic  Vitals:   12/18/15 1100  BP: (!) 117/57  Pulse: (!) 59  Resp: 16  Temp: 98.2 F (36.8 C)    GENERAL:alert, no distress, well nourished, well developed, comfortable, cooperative, obese, smiling and accompanied by her older sister. SKIN: skin color, texture, turgor are normal, no rashes or significant lesions HEAD:  Normocephalic, No masses, lesions, tenderness or abnormalities EYES: normal, EOMI, Conjunctiva are pink and non-injected EARS: External ears normal OROPHARYNX:lips, buccal mucosa, and tongue normal and mucous membranes are moist  NECK: supple, no adenopathy, thyroid normal size, non-tender, without nodularity, trachea midline LYMPH:  no palpable lymphadenopathy BREAST:not examined LUNGS: clear to auscultation and percussion HEART: regular rate & rhythm, no murmurs, no gallops, S1 normal and S2 normal ABDOMEN:abdomen soft, non-tender, obese and normal bowel sounds BACK: Back symmetric, no curvature. EXTREMITIES:less then 2 second capillary refill, no joint deformities, effusion, or inflammation, no skin discoloration, no cyanosis  NEURO: alert & oriented x 3 with fluent speech, no focal motor/sensory deficits, gait normal   LABORATORY DATA: CBC    Component Value Date/Time   WBC 5.9 12/18/2015 1009   RBC 3.71 (L) 12/18/2015 1009   HGB 11.6 (L) 12/18/2015 1009   HCT 35.0 (L) 12/18/2015 1009   PLT 174 12/18/2015 1009   MCV 94.3 12/18/2015 1009   MCH 31.3 12/18/2015 1009   MCHC 33.1 12/18/2015 1009   RDW 13.5 12/18/2015 1009   LYMPHSABS 1.1 12/18/2015 1009   MONOABS 0.6 12/18/2015 1009   EOSABS 0.2 12/18/2015 1009   BASOSABS 0.0 12/18/2015 1009      Chemistry      Component Value Date/Time   NA 136 12/18/2015 1009   K 4.5 12/18/2015 1009   CL 103 12/18/2015 1009   CO2 25 12/18/2015 1009   BUN 30 (H) 12/18/2015 1009   CREATININE 1.43 (H) 12/18/2015 1009      Component Value Date/Time   CALCIUM 10.4 (H) 12/18/2015 1009   ALKPHOS 59 12/18/2015 1009   AST 25 12/18/2015 1009   ALT 24 12/18/2015 1009   BILITOT 0.5 12/18/2015 1009        PENDING LABS:   RADIOGRAPHIC STUDIES:  Mm Screening Breast Tomo Uni R  Result Date: 12/16/2015 CLINICAL DATA:  Screening. EXAM: 2D DIGITAL SCREENING UNILATERAL RIGHT MAMMOGRAM WITH CAD AND ADJUNCT TOMO COMPARISON:  Previous exam(s).  ACR Breast Density Category b: There are scattered areas of fibroglandular density. FINDINGS: The patient has had a left mastectomy. There are no findings suspicious for malignancy. Images were processed with CAD. IMPRESSION: No mammographic evidence of malignancy. A result letter of this screening mammogram will be mailed directly to the patient. RECOMMENDATION: Screening mammogram in one year.  (Code:SM-R-41M) BI-RADS CATEGORY  1: Negative. Electronically Signed   By: Ammie Ferrier M.D.   On: 12/16/2015 12:48     PATHOLOGY:    ASSESSMENT AND PLAN:  Invasive ductal carcinoma of left breast Stage IIIA, invasive ductal carcinoma of left breast presenting with a large mass, retraction of the nipple, 6 x 6 cm clinically with suspicious lymph nodes in the left axilla presentation. ER +100%, PR +100%, Ki-67 marker 66%, LV I was seen, but was felt to be grade 1. HER-2/neu was nonamplified. We treated her with neoadjuvant letrozole for several months. She then underwent left modified radical mastectomy on 01/09/2011  but still had a 4.8 cm cancer. Clinically there was reduction in the size of the tumor. 4 of 4 lymph nodes were involved. She then completed radiation therapy in Feb 2013 postoperatively and was restarted on the letrozole.  She is not a candidate for BCI testing given her nodal status.  Labs today: CBC diff, CMET.  I personally reviewed and went over laboratory results with the patient.  The results are noted within this dictation.  Minimal anemia is noted.  Will need to follow moving forward.  If worse at next lab check, then peripheral work-up for anemia would be indicated.  Mild hypercalcemia is noted.  Her primary care provider has discontinued her Ca++ supplementation since November 15, 2015.  Labs in 6 months: CBC diff, CMET.    Patient reports recent breast exam from her primary care provider.  I personally reviewed and went over radiographic studies with the patient.  The results  are noted within this dictation.  Mammogram is negative.  She will be due in October 2018.  Bone density will be due in July 2018.  Return in 6 months for follow-up.   ORDERS PLACED FOR THIS ENCOUNTER: Orders Placed This Encounter  Procedures  . CBC with Differential  . Comprehensive metabolic panel    MEDICATIONS PRESCRIBED THIS ENCOUNTER: Meds ordered this encounter  Medications  . Atorvastatin Calcium (LIPITOR PO)    Sig: Take by mouth daily.    THERAPY PLAN:  NCCN guidelines recommends the following surveillance for invasive breast cancer (2.2017):  A. History and Physical exam 1-4 times per year as clinically appropriate for 5 years, then annually.  B. Periodic screening for changes in family history and referral to genetics counseling as indicated  C. Educate, monitor, and refer to lymphedema management.  D. Mammography every 12 months  E. Routine imaging of reconstructed breast is not indicated.  F. In the absence of clinical signs and symptoms suggestive of recurrent disease, there is no indication for laboratory or imaging studies for metastases screening.  G. Women on Tamoxifen: annual gynecologic assessment every 12 months if uterus is present.  H. Women on aromatase inhibitor or who experience ovarian failure secondary to treatment should have monitoring of bone health with a bone mineral density determination at baseline and periodically thereafter.  I. Assess and encourage adherence to adjuvant endocrine therapy.  J. Evidence suggests that active lifestyle, healthy diet, limited alcohol intake, and achieving and maintaining an ideal body weight (20-25 BMI) may lead to optimal breast cancer outcomes.   All questions were answered. The patient knows to call the clinic with any problems, questions or concerns. We can certainly see the patient much sooner if necessary.  Patient and plan discussed with Dr. Ancil Linsey and she is in agreement with the aforementioned.     This note is electronically signed by: Robynn Pane, PA-C 12/18/2015 11:59 AM

## 2015-12-18 NOTE — Patient Instructions (Signed)
[place] at [facility] Discharge Instructions  RECOMMENDATIONS MADE BY THE CONSULTANT AND ANY TEST RESULTS WILL BE SENT TO YOUR REFERRING PHYSICIAN.  You were seen today by Kirby Crigler PA-C. Return in 6 months for follow up and labs.   Thank you for choosing [place] at [facility] to provide your oncology and hematology care.  To afford each patient quality time with our provider, please arrive at least 15 minutes before your scheduled appointment time.   Beginning January 23rd 2017 lab work for the [facility] will be done in the  Main lab at [facility] on 1st floor. If you have a lab appointment with the [facility] please come in thru the  Main Entrance and check in at the main information desk  You need to re-schedule your appointment should you arrive 10 or more minutes late.  We strive to give you quality time with our providers, and arriving late affects you and other patients whose appointments are after yours.  Also, if you no show three or more times for appointments you may be dismissed from the clinic at the providers discretion.     Again, thank you for choosing [facility].  Our hope is that these requests will decrease the amount of time that you wait before being seen by our physicians.       _____________________________________________________________  Should you have questions after your visit to [facility], please contact our office at [phone] between the hours of 8:30 a.m. and 4:30 p.m.  Voicemails left after 4:30 p.m. will not be returned until the following business day.  For prescription refill requests, have your pharmacy contact our office.         Resources For Cancer Patients and their Caregivers ? American Cancer Society: Can assist with transportation, wigs, general needs, runs Look Good Feel Better.        ([phone] ? Cancer Care: Provides financial assistance,  online support groups, medication/co-pay assistance.  1-800-813-HOPE (817) 361-1329) ? [facility] Assists [place] cancer patients and their families through emotional , educational and financial support.  (931) 697-8330 ? [place] DSS Where to apply for food stamps, Medicaid and utility assistance. 918-438-4859 ? [facility]: Transportation to medical appointments. 718-778-0578 ? Social Security Administration: May apply for disability if have a Stage IV cancer. 475-588-7402 986-456-0152 ? [employer], Disability and Transit Services: Assists with nutrition, care and transit needs. [place] Support Programs: @10RELATIVEDAYS @ > Cancer Support Group  2nd Tuesday of the month 1pm-2pm, Journey Room  > Creative Journey  3rd Tuesday of the month 1130am-1pm, Journey Room  > Look Good Feel Better  1st Wednesday of the month 10am-12 noon, Journey Room (Call [place] to register [phone])

## 2016-06-19 ENCOUNTER — Other Ambulatory Visit (HOSPITAL_COMMUNITY): Payer: Medicare Other

## 2016-06-19 ENCOUNTER — Ambulatory Visit (HOSPITAL_COMMUNITY): Payer: Medicare Other | Admitting: Oncology

## 2016-06-19 ENCOUNTER — Ambulatory Visit (HOSPITAL_COMMUNITY): Payer: Medicare Other | Admitting: Hematology & Oncology

## 2016-06-22 ENCOUNTER — Other Ambulatory Visit (HOSPITAL_COMMUNITY): Payer: Medicare Other

## 2016-06-22 ENCOUNTER — Ambulatory Visit (HOSPITAL_COMMUNITY): Payer: Medicare Other | Admitting: Oncology

## 2016-06-23 ENCOUNTER — Encounter (HOSPITAL_COMMUNITY): Payer: Medicare Other | Attending: Oncology | Admitting: Oncology

## 2016-06-23 ENCOUNTER — Encounter (HOSPITAL_COMMUNITY): Payer: Medicare Other

## 2016-06-23 ENCOUNTER — Encounter (HOSPITAL_COMMUNITY): Payer: Self-pay | Admitting: Oncology

## 2016-06-23 ENCOUNTER — Other Ambulatory Visit (HOSPITAL_COMMUNITY): Payer: Self-pay | Admitting: Oncology

## 2016-06-23 VITALS — BP 142/102 | HR 53 | Temp 97.5°F | Resp 18 | Ht 63.0 in | Wt 205.0 lb

## 2016-06-23 DIAGNOSIS — Z780 Asymptomatic menopausal state: Secondary | ICD-10-CM

## 2016-06-23 DIAGNOSIS — C50912 Malignant neoplasm of unspecified site of left female breast: Secondary | ICD-10-CM

## 2016-06-23 DIAGNOSIS — F329 Major depressive disorder, single episode, unspecified: Secondary | ICD-10-CM | POA: Diagnosis not present

## 2016-06-23 DIAGNOSIS — Z79811 Long term (current) use of aromatase inhibitors: Secondary | ICD-10-CM

## 2016-06-23 DIAGNOSIS — D631 Anemia in chronic kidney disease: Secondary | ICD-10-CM

## 2016-06-23 DIAGNOSIS — Z170 Estrogen receptor positive status [ER+]: Secondary | ICD-10-CM | POA: Diagnosis not present

## 2016-06-23 DIAGNOSIS — G4700 Insomnia, unspecified: Secondary | ICD-10-CM

## 2016-06-23 DIAGNOSIS — D649 Anemia, unspecified: Secondary | ICD-10-CM

## 2016-06-23 DIAGNOSIS — N183 Chronic kidney disease, stage 3 unspecified: Secondary | ICD-10-CM

## 2016-06-23 DIAGNOSIS — Z0000 Encounter for general adult medical examination without abnormal findings: Secondary | ICD-10-CM

## 2016-06-23 HISTORY — DX: Anemia, unspecified: D64.9

## 2016-06-23 LAB — CBC WITH DIFFERENTIAL/PLATELET
Basophils Absolute: 0 10*3/uL (ref 0.0–0.1)
Basophils Relative: 1 %
EOS PCT: 4 %
Eosinophils Absolute: 0.2 10*3/uL (ref 0.0–0.7)
HEMATOCRIT: 34.4 % — AB (ref 36.0–46.0)
Hemoglobin: 11.1 g/dL — ABNORMAL LOW (ref 12.0–15.0)
LYMPHS ABS: 1.1 10*3/uL (ref 0.7–4.0)
LYMPHS PCT: 20 %
MCH: 31 pg (ref 26.0–34.0)
MCHC: 32.3 g/dL (ref 30.0–36.0)
MCV: 96.1 fL (ref 78.0–100.0)
MONO ABS: 0.6 10*3/uL (ref 0.1–1.0)
MONOS PCT: 11 %
Neutro Abs: 3.7 10*3/uL (ref 1.7–7.7)
Neutrophils Relative %: 65 %
PLATELETS: 168 10*3/uL (ref 150–400)
RBC: 3.58 MIL/uL — ABNORMAL LOW (ref 3.87–5.11)
RDW: 13.9 % (ref 11.5–15.5)
WBC: 5.8 10*3/uL (ref 4.0–10.5)

## 2016-06-23 LAB — COMPREHENSIVE METABOLIC PANEL
ALT: 20 U/L (ref 14–54)
AST: 23 U/L (ref 15–41)
Albumin: 4 g/dL (ref 3.5–5.0)
Alkaline Phosphatase: 78 U/L (ref 38–126)
Anion gap: 8 (ref 5–15)
BILIRUBIN TOTAL: 0.6 mg/dL (ref 0.3–1.2)
BUN: 25 mg/dL — ABNORMAL HIGH (ref 6–20)
CHLORIDE: 104 mmol/L (ref 101–111)
CO2: 25 mmol/L (ref 22–32)
CREATININE: 1.36 mg/dL — AB (ref 0.44–1.00)
Calcium: 9.7 mg/dL (ref 8.9–10.3)
GFR, EST AFRICAN AMERICAN: 41 mL/min — AB (ref >60)
GFR, EST NON AFRICAN AMERICAN: 35 mL/min — AB (ref >60)
Glucose, Bld: 147 mg/dL — ABNORMAL HIGH (ref 65–99)
POTASSIUM: 4.6 mmol/L (ref 3.5–5.1)
Sodium: 137 mmol/L (ref 135–145)
TOTAL PROTEIN: 6.9 g/dL (ref 6.5–8.1)

## 2016-06-23 MED ORDER — ESCITALOPRAM OXALATE 10 MG PO TABS: 10.0000 mg | ORAL_TABLET | Freq: Every day | ORAL | 1 refills | Status: DC

## 2016-06-23 MED ORDER — ESCITALOPRAM OXALATE 10 MG PO TABS: 10.0000 mg | ORAL_TABLET | Freq: Every day | ORAL | 0 refills | Status: DC

## 2016-06-23 NOTE — Assessment & Plan Note (Addendum)
Stage IIIA, invasive ductal carcinoma of left breast presenting with a large mass, retraction of the nipple, 6 x 6 cm clinically with suspicious lymph nodes in the left axilla presentation. ER +100%, PR +100%, Ki-67 marker 66%, LV I was seen, but was felt to be grade 1. HER-2/neu was nonamplified. We treated her with neoadjuvant letrozole for several months. She then underwent left modified radical mastectomy on 01/09/2011 but still had a 4.8 cm cancer. Clinically there was reduction in the size of the tumor. 4 of 4 lymph nodes were involved. She then completed radiation therapy in Feb 2013 postoperatively and was restarted on the letrozole.  She is not a candidate for BCI testing given her nodal status.  Labs today: CBC diff, CMET.  I personally reviewed and went over laboratory results with the patient.  The results are noted within this dictation. Labs today are stable.  Mild normocytic, normochromic anemia is noted.  Will work this up with her next appointment.  Labs in 6 months: CBC diff, CMET.    I personally reviewed and went over radiographic studies with the patient.  The results are noted within this dictation.  Mammogram is negative.  She will be due in October 2018.  Bone density will be due in July 2018.  Order is placed and this will be scheduled accordingly.  Problem list reviewed with patient and edited accordingly.  Medications are reviewed with the patient and edited accordingly.  Return in 6 months for follow-up.  More than 50% of the time spent with the patient was utilized for counseling and coordination of care.

## 2016-06-23 NOTE — Addendum Note (Signed)
Addended by: [facility] on: 06/23/2016 01:37 PM   Modules accepted: Orders

## 2016-06-23 NOTE — Assessment & Plan Note (Signed)
Normocytic, normochromic anemia in the setting of Stage III chronic renal disease.  Stable.  Labs in 6 months: CBC diff, anemia panel, retic count, haptoglobin, EPO level, LDH, ESR, CRP, pathology smear review  Anemia is likely secondary to chronic renal disease.

## 2016-06-23 NOTE — Progress Notes (Addendum)
Stacy Furlong, MD [address] [place] [address]  Invasive ductal carcinoma of left breast ([place]) - Plan: DG Bone Density, CBC with Differential, Comprehensive metabolic panel  Aromatase inhibitor use - Plan: DG Bone Density  Post-menopausal - Plan: DG Bone Density  Normocytic normochromic anemia - Plan: CBC with Differential, Comprehensive metabolic panel, Pathologist smear review, Vitamin B12, Folate, Iron and TIBC, Ferritin, Reticulocytes, Haptoglobin, Lactate dehydrogenase, Sedimentation rate, C-reactive protein  Chronic renal disease, stage 3, moderately decreased glomerular filtration rate (GFR) between 30-59 mL/min/1.73 square meter - Plan: CBC with Differential, Comprehensive metabolic panel, Erythropoietin  Reactive depression - Plan: escitalopram (LEXAPRO) 10 MG tablet, DISCONTINUED: escitalopram (LEXAPRO) 10 MG tablet  CURRENT THERAPY: Letrozole 2.5 mg daily beginning on 09/19/2010 neoadjuvantly and then restarted in October 2012 following surgery in the adjuvant setting.  INTERVAL HISTORY: Stacy [name] 83 y.o. female returns for followup of advanced stage IIIA, invasive ductal carcinoma of left breast presenting with a large mass, retraction of the nipple, 6 x 6 cm clinically with suspicious lymph nodes in the left axilla presentation. ER +100%, PR +100%, Ki-67 marker 66%, LV I was seen, but was felt to be grade 1. HER-2/neu was nonamplified. We treated her with neoadjuvant letrozole for several months. She then underwent left modified radical mastectomy on 01/09/2011 but still had a 4.8 cm cancer. Clinically there was reduction in the size of the tumor. 4 of 4 lymph nodes were involved. She then completed radiation therapy in Feb 2013 postoperatively and was restarted on the letrozole.     Oncological, the patient is doing well.  She reports compliance with her aromatase inhibitor.  She denies any side effects including hot flashes, arthralgias,  and myalgias.  She is compliant with calcium and vitamin D as well.  Her husband is in a skilled nursing facility secondary to dementia.  She notes that he is well taken care of.  She reports issues with insomnia and I will defer this to her primary care physician.  Review of Systems  Constitutional: Negative.  Negative for chills, fever and weight loss.  HENT: Negative.   Eyes: Negative.   Respiratory: Negative.  Negative for cough.   Cardiovascular: Negative.  Negative for chest pain.  Gastrointestinal: Negative.  Negative for blood in stool, constipation, diarrhea, melena, nausea and vomiting.  Genitourinary: Negative.   Musculoskeletal: Negative.   Skin: Negative.   Neurological: Negative.  Negative for weakness.  Endo/Heme/Allergies: Negative.   Psychiatric/Behavioral: Negative.     Past Medical History:  Diagnosis Date  . Anemia   . Anxiety   . Cancer ([place])   . Depression   . Diabetes mellitus type II   . Fracture of hand 09/29/12   fell and fractured right hand  . Hyperlipemia   . Hypertension   . Invasive ductal carcinoma of left breast ([place]) 10/03/2010  . Normocytic normochromic anemia 06/23/2016    Past Surgical History:  Procedure Laterality Date  . APPENDECTOMY    . CATARACT EXTRACTION W/ INTRAOCULAR LENS IMPLANT  08/26/10   Right; [facility]  . CATARACT EXTRACTION W/PHACO  10/14/2010   Procedure: CATARACT EXTRACTION PHACO AND INTRAOCULAR LENS PLACEMENT ([facility]);  Surgeon: Elta Guadeloupe T. [facility];  Location: [facility];  Service: Ophthalmology;  Laterality: Left;  CDE:  8.79  . INCISIONAL BREAST BIOPSY  09/05/10   Left, [facility], [place]  . MASTECTOMY      Family History  Problem Relation Age of Onset  . Anesthesia  problems Neg Hx     Social History   Social History  . Marital status: Married    Spouse name: N/A  . Number of children: N/A  . Years of education: N/A   Social History Main Topics  . Smoking status: Former Research scientist (life sciences)  . Smokeless tobacco: Never Used  .  Alcohol use No  . Drug use: No  . Sexual activity: Not Currently   Other Topics Concern  . None   Social History Narrative  . None     PHYSICAL EXAMINATION  ECOG PERFORMANCE STATUS: 0 - Asymptomatic  Vitals:   06/23/16 1037  BP: (!) 142/102  Pulse: (!) 53  Resp: 18  Temp: 97.5 F (36.4 C)    GENERAL:alert, no distress, well nourished, well developed, comfortable, cooperative, obese, smiling and unaccompanied today. SKIN: skin color, texture, turgor are normal, no rashes or significant lesions HEAD: Normocephalic, No masses, lesions, tenderness or abnormalities EYES: normal, EOMI, Conjunctiva are pink and non-injected EARS: External ears normal OROPHARYNX:lips, buccal mucosa, and tongue normal and mucous membranes are moist  NECK: supple, no adenopathy, thyroid normal size, non-tender, without nodularity, trachea midline LYMPH:  no palpable lymphadenopathy BREAST:not examined LUNGS: clear to auscultation and percussion without wheezes, rales, or rhonchi. HEART: regular rate & rhythm, no murmurs, no gallops, S1 normal and S2 normal ABDOMEN:abdomen soft, non-tender, obese and normal bowel sounds BACK: Back symmetric, no curvature. EXTREMITIES:less then 2 second capillary refill, no joint deformities, effusion, or inflammation, no skin discoloration, no cyanosis  NEURO: alert & oriented x 3 with fluent speech, no focal motor/sensory deficits, gait normal   LABORATORY DATA: CBC    Component Value Date/Time   WBC 5.8 06/23/2016 1001   RBC 3.58 (L) 06/23/2016 1001   HGB 11.1 (L) 06/23/2016 1001   HCT 34.4 (L) 06/23/2016 1001   PLT 168 06/23/2016 1001   MCV 96.1 06/23/2016 1001   MCH 31.0 06/23/2016 1001   MCHC 32.3 06/23/2016 1001   RDW 13.9 06/23/2016 1001   LYMPHSABS 1.1 06/23/2016 1001   MONOABS 0.6 06/23/2016 1001   EOSABS 0.2 06/23/2016 1001   BASOSABS 0.0 06/23/2016 1001      Chemistry      Component Value Date/Time   NA 137 06/23/2016 1001   K 4.6  06/23/2016 1001   CL 104 06/23/2016 1001   CO2 25 06/23/2016 1001   BUN 25 (H) 06/23/2016 1001   CREATININE 1.36 (H) 06/23/2016 1001      Component Value Date/Time   CALCIUM 9.7 06/23/2016 1001   ALKPHOS 78 06/23/2016 1001   AST 23 06/23/2016 1001   ALT 20 06/23/2016 1001   BILITOT 0.6 06/23/2016 1001        PENDING LABS:   RADIOGRAPHIC STUDIES:  No results found.   PATHOLOGY:    ASSESSMENT AND PLAN:  Invasive ductal carcinoma of left breast Stage IIIA, invasive ductal carcinoma of left breast presenting with a large mass, retraction of the nipple, 6 x 6 cm clinically with suspicious lymph nodes in the left axilla presentation. ER +100%, PR +100%, Ki-67 marker 66%, LV I was seen, but was felt to be grade 1. HER-2/neu was nonamplified. We treated her with neoadjuvant letrozole for several months. She then underwent left modified radical mastectomy on 01/09/2011 but still had a 4.8 cm cancer. Clinically there was reduction in the size of the tumor. 4 of 4 lymph nodes were involved. She then completed radiation therapy in Feb 2013 postoperatively and was restarted on the letrozole.  She is not a candidate for BCI testing given her nodal status.  Labs today: CBC diff, CMET.  I personally reviewed and went over laboratory results with the patient.  The results are noted within this dictation. Labs today are stable.  Mild normocytic, normochromic anemia is noted.  Will work this up with her next appointment.  Labs in 6 months: CBC diff, CMET.    I personally reviewed and went over radiographic studies with the patient.  The results are noted within this dictation.  Mammogram is negative.  She will be due in October 2018.  Bone density will be due in July 2018.  Order is placed and this will be scheduled accordingly.  Problem list reviewed with patient and edited accordingly.  Medications are reviewed with the patient and edited accordingly.  Return in 6 months for  follow-up.  More than 50% of the time spent with the patient was utilized for counseling and coordination of care.  Normocytic normochromic anemia Normocytic, normochromic anemia in the setting of Stage III chronic renal disease.  Stable.  Labs in 6 months: CBC diff, anemia panel, retic count, haptoglobin, EPO level, LDH, ESR, CRP, pathology smear review  Anemia is likely secondary to chronic renal disease.   ORDERS PLACED FOR THIS ENCOUNTER: Orders Placed This Encounter  Procedures  . DG Bone Density  . CBC with Differential  . Comprehensive metabolic panel  . Pathologist smear review  . Vitamin B12  . Folate  . Iron and TIBC  . Ferritin  . Erythropoietin  . Reticulocytes  . Haptoglobin  . Lactate dehydrogenase  . Sedimentation rate  . C-reactive protein    MEDICATIONS PRESCRIBED THIS ENCOUNTER: Meds ordered this encounter  Medications  . DISCONTD: escitalopram (LEXAPRO) 10 MG tablet    Sig: Take 1 tablet (10 mg total) by mouth daily.    Dispense:  30 tablet    Refill:  0    Future refills from primary care provider    Order Specific Question:   Supervising Provider    Answer:   Brunetta Genera [[id number]]  . escitalopram (LEXAPRO) 10 MG tablet    Sig: Take 1 tablet (10 mg total) by mouth daily.    Dispense:  90 tablet    Refill:  1    Order Specific Question:   Supervising Provider    Answer:   Brunetta Genera [[id number]]    THERAPY PLAN:  NCCN guidelines recommends the following surveillance for invasive breast cancer (2.2017):  A. History and Physical exam 1-4 times per year as clinically appropriate for 5 years, then annually.  B. Periodic screening for changes in family history and referral to genetics counseling as indicated  C. Educate, monitor, and refer to lymphedema management.  D. Mammography every 12 months  E. Routine imaging of reconstructed breast is not indicated.  F. In the absence of clinical signs and symptoms suggestive of recurrent  disease, there is no indication for laboratory or imaging studies for metastases screening.  G. Women on Tamoxifen: annual gynecologic assessment every 12 months if uterus is present.  H. Women on aromatase inhibitor or who experience ovarian failure secondary to treatment should have monitoring of bone health with a bone mineral density determination at baseline and periodically thereafter.  I. Assess and encourage adherence to adjuvant endocrine therapy.  J. Evidence suggests that active lifestyle, healthy diet, limited alcohol intake, and achieving and maintaining an ideal body weight (20-25 BMI) may lead to optimal breast cancer outcomes.  All questions were answered. The patient knows to call the clinic with any problems, questions or concerns. We can certainly see the patient much sooner if necessary.  Patient and plan discussed with Dr. Twana First and she is in agreement with the aforementioned.   This note is electronically signed by: Doy Mince 06/23/2016 1:36 PM

## 2016-06-23 NOTE — Patient Instructions (Addendum)
[place] at [facility] Discharge Instructions  RECOMMENDATIONS MADE BY THE CONSULTANT AND ANY TEST RESULTS WILL BE SENT TO YOUR REFERRING PHYSICIAN.  You were seen today by Kirby Crigler PA-C Lab work today Lab work in 6 months with follow up Bone density scan in July Mammogram in October    Thank you for choosing [place] at [facility] to provide your oncology and hematology care.  To afford each patient quality time with our provider, please arrive at least 15 minutes before your scheduled appointment time.    If you have a lab appointment with the [facility] please come in thru the  Main Entrance and check in at the main information desk  You need to re-schedule your appointment should you arrive 10 or more minutes late.  We strive to give you quality time with our providers, and arriving late affects you and other patients whose appointments are after yours.  Also, if you no show three or more times for appointments you may be dismissed from the clinic at the providers discretion.     Again, thank you for choosing [facility].  Our hope is that these requests will decrease the amount of time that you wait before being seen by our physicians.       _____________________________________________________________  Should you have questions after your visit to [facility], please contact our office at [phone] between the hours of 8:30 a.m. and 4:30 p.m.  Voicemails left after 4:30 p.m. will not be returned until the following business day.  For prescription refill requests, have your pharmacy contact our office.       Resources For Cancer Patients and their Caregivers ? American Cancer Society: Can assist with transportation, wigs, general needs, runs Look Good Feel Better.        3344914417 ? Cancer Care: Provides financial assistance, online support groups, medication/co-pay assistance.   1-800-813-HOPE (640)593-3887) ? [facility] Assists [place] cancer patients and their families through emotional , educational and financial support.  3463332620 ? [place] DSS Where to apply for food stamps, Medicaid and utility assistance. 463-656-6345 ? [facility]: Transportation to medical appointments. (858)081-4349 ? Social Security Administration: May apply for disability if have a Stage IV cancer. (865) 669-4074 (603) 394-5052 ? [employer], Disability and Transit Services: Assists with nutrition, care and transit needs. [place] Support Programs: @10RELATIVEDAYS @ > Cancer Support Group  2nd Tuesday of the month 1pm-2pm, Journey Room  > Creative Journey  3rd Tuesday of the month 1130am-1pm, Journey Room  > Look Good Feel Better  1st Wednesday of the month 10am-12 noon, Journey Room (Call [facility] to register [phone])

## 2016-07-16 IMAGING — CR DG PELVIS 1-2V
1 series · 1 of 1 positions shown · non-contrast
Comparison: CT of 09/05/2010

CLINICAL DATA: Increasing bilateral hip pain since [REDACTED], no
injury, aching pain from hip to hip posterior and down left leg

EXAM:
PELVIS - 1-2 VIEW

[ap]
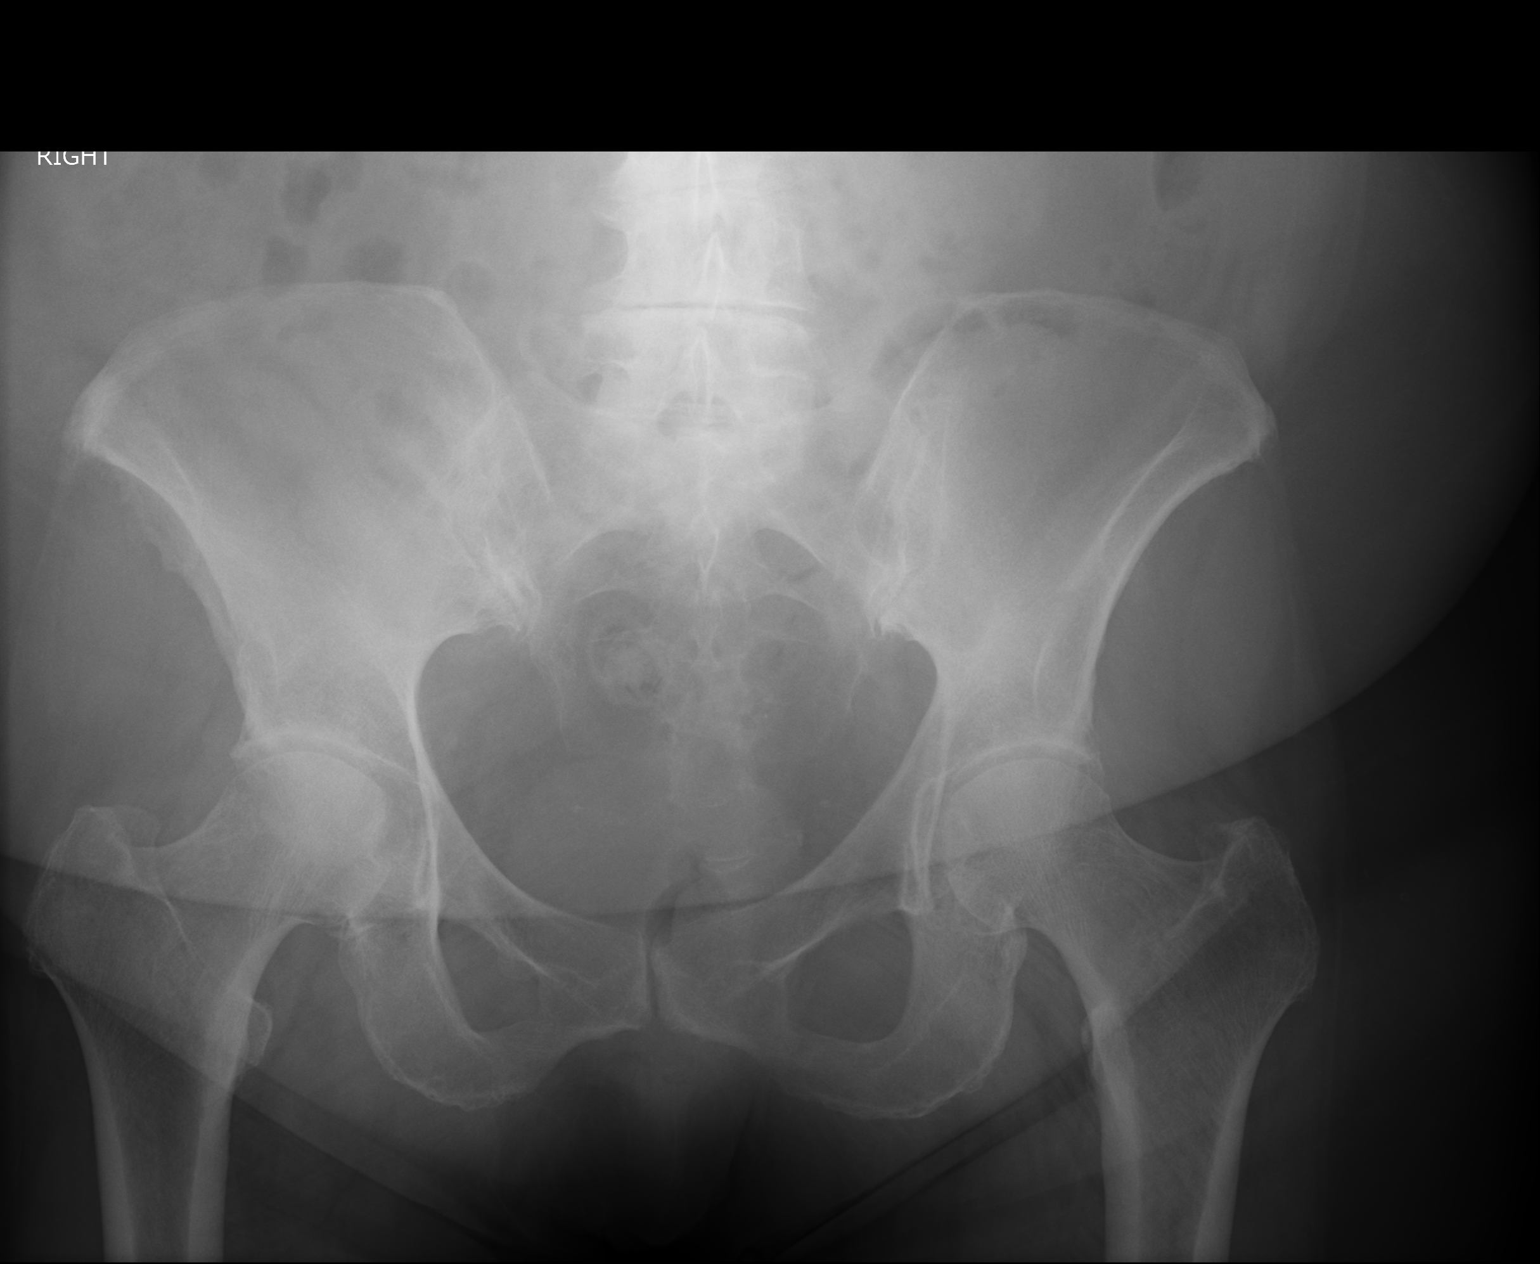

[1 of 1 positions shown; findings below may reference images not displayed]

FINDINGS: AP view of the pelvis demonstrates both sacroiliac joints to be
symmetric. Degenerative disc disease at L4-5 and L3-4, worse on the
right. Bilateral mild osteoarthritis involves the hip.
Weight-bearing surface joint space narrowing and subchondral
sclerosis. No acute fracture or dislocation.
IMPRESSION: Bilateral mild hip osteoarthritis. Lower lumbar
spondylosis/degenerative disc disease. No acute osseous abnormality.

## 2016-09-18 ENCOUNTER — Other Ambulatory Visit (HOSPITAL_COMMUNITY): Payer: Medicare Other

## 2016-09-23 ENCOUNTER — Other Ambulatory Visit (HOSPITAL_COMMUNITY): Payer: Medicare Other

## 2016-10-09 ENCOUNTER — Emergency Department (HOSPITAL_COMMUNITY): Payer: Medicare Other

## 2016-10-09 ENCOUNTER — Encounter (HOSPITAL_COMMUNITY): Payer: Self-pay | Admitting: *Deleted

## 2016-10-09 ENCOUNTER — Emergency Department (HOSPITAL_COMMUNITY)
Admission: EM | Admit: 2016-10-09 | Discharge: 2016-10-09 | Disposition: A | Discharge status: Home / Self Care | Payer: Medicare Other | Attending: Emergency Medicine | Admitting: Emergency Medicine

## 2016-10-09 DIAGNOSIS — Z87891 Personal history of nicotine dependence: Secondary | ICD-10-CM | POA: Insufficient documentation

## 2016-10-09 DIAGNOSIS — E119 Type 2 diabetes mellitus without complications: Secondary | ICD-10-CM | POA: Diagnosis not present

## 2016-10-09 DIAGNOSIS — Z7982 Long term (current) use of aspirin: Secondary | ICD-10-CM | POA: Diagnosis not present

## 2016-10-09 DIAGNOSIS — Z79899 Other long term (current) drug therapy: Secondary | ICD-10-CM | POA: Diagnosis not present

## 2016-10-09 DIAGNOSIS — Z7984 Long term (current) use of oral hypoglycemic drugs: Secondary | ICD-10-CM | POA: Diagnosis not present

## 2016-10-09 DIAGNOSIS — I10 Essential (primary) hypertension: Secondary | ICD-10-CM | POA: Diagnosis not present

## 2016-10-09 DIAGNOSIS — R1011 Right upper quadrant pain: Secondary | ICD-10-CM | POA: Diagnosis not present

## 2016-10-09 LAB — CBC WITH DIFFERENTIAL/PLATELET
Basophils Absolute: 0 10*3/uL (ref 0.0–0.1)
Basophils Relative: 1 %
Eosinophils Absolute: 0.4 10*3/uL (ref 0.0–0.7)
Eosinophils Relative: 5 %
HCT: 33 % — ABNORMAL LOW (ref 36.0–46.0)
HEMOGLOBIN: 11.1 g/dL — AB (ref 12.0–15.0)
LYMPHS ABS: 1 10*3/uL (ref 0.7–4.0)
Lymphocytes Relative: 12 %
MCH: 32.2 pg (ref 26.0–34.0)
MCHC: 33.6 g/dL (ref 30.0–36.0)
MCV: 95.7 fL (ref 78.0–100.0)
MONO ABS: 0.7 10*3/uL (ref 0.1–1.0)
MONOS PCT: 8 %
Neutro Abs: 6.2 10*3/uL (ref 1.7–7.7)
Neutrophils Relative %: 74 %
Platelets: 210 10*3/uL (ref 150–400)
RBC: 3.45 MIL/uL — ABNORMAL LOW (ref 3.87–5.11)
RDW: 14.5 % (ref 11.5–15.5)
WBC: 8.4 10*3/uL (ref 4.0–10.5)

## 2016-10-09 LAB — URINALYSIS, ROUTINE W REFLEX MICROSCOPIC
Bilirubin Urine: NEGATIVE
GLUCOSE, UA: NEGATIVE mg/dL
Hgb urine dipstick: NEGATIVE
KETONES UR: NEGATIVE mg/dL
LEUKOCYTES UA: NEGATIVE
Nitrite: NEGATIVE
PH: 6 (ref 5.0–8.0)
Protein, ur: NEGATIVE mg/dL
SPECIFIC GRAVITY, URINE: 1.017 (ref 1.005–1.030)

## 2016-10-09 LAB — COMPREHENSIVE METABOLIC PANEL
ALBUMIN: 3.8 g/dL (ref 3.5–5.0)
ALT: 16 U/L (ref 14–54)
AST: 17 U/L (ref 15–41)
Alkaline Phosphatase: 65 U/L (ref 38–126)
Anion gap: 9 (ref 5–15)
BILIRUBIN TOTAL: 0.6 mg/dL (ref 0.3–1.2)
BUN: 26 mg/dL — AB (ref 6–20)
CHLORIDE: 104 mmol/L (ref 101–111)
CO2: 26 mmol/L (ref 22–32)
Calcium: 10 mg/dL (ref 8.9–10.3)
Creatinine, Ser: 1.01 mg/dL — ABNORMAL HIGH (ref 0.44–1.00)
GFR calc Af Amer: 58 mL/min — ABNORMAL LOW (ref >60)
GFR calc non Af Amer: 50 mL/min — ABNORMAL LOW (ref >60)
GLUCOSE: 148 mg/dL — AB (ref 65–99)
POTASSIUM: 4.6 mmol/L (ref 3.5–5.1)
Sodium: 139 mmol/L (ref 135–145)
Total Protein: 7.4 g/dL (ref 6.5–8.1)

## 2016-10-09 LAB — LIPASE, BLOOD: Lipase: 30 U/L (ref 11–51)

## 2016-10-09 MED ORDER — ONDANSETRON HCL 4 MG/2ML IJ SOLN: 4.0000 mg | INTRAMUSCULAR | Status: DC | PRN

## 2016-10-09 MED ORDER — MORPHINE SULFATE (PF) 2 MG/ML IV SOLN: 2.0000 mg | INTRAVENOUS | Status: DC | PRN

## 2016-10-09 MED ORDER — FAMOTIDINE 20 MG PO TABS: 20.0000 mg | ORAL_TABLET | Freq: Two times a day (BID) | ORAL | 0 refills | Status: DC

## 2016-10-09 NOTE — ED Triage Notes (Signed)
Pt c/o RUQ nagging pain that has been going on for about 1 week but has gotten worse since last night after eating. Pt denies n/v/d, fever. Pt reports the pain goes into her back. Pt was seen at Urgent Care in [place], [place] today and was told to come to ED to have gallbladder examined.

## 2016-10-09 NOTE — ED Notes (Signed)
From radiology 

## 2016-10-09 NOTE — Discharge Instructions (Signed)
Eat a bland diet, avoiding greasy, fatty, fried foods, as well as spicy and acidic foods or beverages.  Avoid eating within the hour or 2 before going to bed or laying down.  Also avoid teas, colas, coffee, chocolate, pepermint and spearment. Take the prescription as directed.  Call your regular medical doctor today to schedule a follow up appointment in the next 3 days. Call the GI doctor today to schedule a follow up appointment within the next week.  Return to the Emergency Department immediately if worsening.

## 2016-10-09 NOTE — ED Provider Notes (Signed)
[facility] DEPT Provider Note   CSN: [id number] Arrival date & time: 10/09/16  1017     History   Chief Complaint Chief Complaint  Patient presents with  . Abdominal Pain    HPI Stacy [name] is a 83 y.o. female.  HPI  Pt was seen at 1050.  Per pt, c/o gradual onset and persistence of constant RUQ abd "pain" for the past 1 week, worse since last evening after eating. Has been associated with nausea.  Describes the abd pain as "aching" and "nagging." Pt states she and her sister both "got sick" after eating at a restaurant last weekend before her symptoms started. Pt was evaluated at an [facility] P[facility], then sent to the ED "to have more tests to check my gallbladder."  Denies vomiting/diarrhea, no fevers, no back pain, no rash, no CP/palpitations, no cough/SOB, no black or blood in stools.        Past Medical History:  Diagnosis Date  . Anemia   . Anxiety   . Cancer ([place])   . Depression   . Diabetes mellitus type II   . Fracture of hand 09/29/12   fell and fractured right hand  . Hyperlipemia   . Hypertension   . Invasive ductal carcinoma of left breast ([place]) 10/03/2010  . Normocytic normochromic anemia 06/23/2016    Patient Active Problem List   Diagnosis Date Noted  . Normocytic normochromic anemia 06/23/2016  . Chronic renal disease, stage 3, moderately decreased glomerular filtration rate (GFR) between 30-59 mL/min/1.73 square meter 06/23/2016  . Aromatase inhibitor use 06/23/2016  . Reactive depression 06/23/2016  . Lymphedema 05/08/2013  . Invasive ductal carcinoma of left breast ([place]) 10/03/2010    Past Surgical History:  Procedure Laterality Date  . APPENDECTOMY    . CATARACT EXTRACTION W/ INTRAOCULAR LENS IMPLANT  08/26/10   Right; [facility]  . CATARACT EXTRACTION W/PHACO  10/14/2010   Procedure: CATARACT EXTRACTION PHACO AND INTRAOCULAR LENS PLACEMENT (IOC);  Surgeon: Elta Guadeloupe T. Gershon Crane;  Location: [facility];  Service: Ophthalmology;  Laterality: Left;  CDE:  8.79  .  INCISIONAL BREAST BIOPSY  09/05/10   Left, [facility], [facility]  . MASTECTOMY      OB History    No data available       Home Medications    Prior to Admission medications   Medication Sig Start Date End Date Taking? Authorizing Provider  aspirin 81 MG tablet Take 81 mg by mouth daily.     Yes [provider]  atorvastatin (LIPITOR) 20 MG tablet Take 1 tablet by mouth daily. 09/21/16  Yes [provider]  Calcium Carbonate-Vitamin D (CALCIUM 600+D) 600-400 MG-UNIT per tablet Take 1 tablet by mouth daily.     Yes [provider]  cholecalciferol (VITAMIN D) 1000 UNITS tablet Take 1,000 Units by mouth daily.     Yes [provider]  escitalopram (LEXAPRO) 10 MG tablet Take 1 tablet (10 mg total) by mouth daily. 06/23/16  Yes Kefalas, Manon Hilding, PA-C  famotidine (PEPCID) 20 MG tablet Take 20 mg by mouth daily.     Yes [provider]  letrozole (FEMARA) 2.5 MG tablet TAKE 1 TABLET DAILY 12/09/15  Yes Penland, Kelby Fam, MD  lisinopril (PRINIVIL,ZESTRIL) 20 MG tablet Take 1 tablet by mouth every evening. 09/21/16  Yes [provider]  metFORMIN (GLUCOPHAGE) 500 MG tablet Take 500 mg by mouth every evening.    Yes [provider]  Multiple Vitamin (MULTIVITAMIN) tablet Take 1 tablet by mouth  daily.     Yes [provider]  HYDROcodone-acetaminophen (NORCO/VICODIN) 5-325 MG tablet Take 1 tablet by mouth every 4 (four) hours as needed. Patient not taking: Reported on 10/09/2016 03/17/15   Nat Christen, MD    Family History Family History  Problem Relation Age of Onset  . Anesthesia problems Neg Hx     Social History Social History  Substance Use Topics  . Smoking status: Former Research scientist (life sciences)  . Smokeless tobacco: Never Used  . Alcohol use No     Allergies   Ciprofloxacin; Ferrous sulfate; and Sulfa drugs cross reactors   Review of Systems Review of Systems ROS: Statement: All systems negative except as marked or noted in the HPI;  Constitutional: Negative for fever and chills. ; ; Eyes: Negative for eye pain, redness and discharge. ; ; ENMT: Negative for ear pain, hoarseness, nasal congestion, sinus pressure and sore throat. ; ; Cardiovascular: Negative for chest pain, palpitations, diaphoresis, dyspnea and peripheral edema. ; ; Respiratory: Negative for cough, wheezing and stridor. ; ; Gastrointestinal: +nausea, abd pain. Negative for vomiting, diarrhea, blood in stool, hematemesis, jaundice and rectal bleeding. . ; ; Genitourinary: Negative for dysuria, flank pain and hematuria. ; ; Musculoskeletal: Negative for back pain and neck pain. Negative for swelling and trauma.; ; Skin: Negative for pruritus, rash, abrasions, blisters, bruising and skin lesion.; ; Neuro: Negative for headache, lightheadedness and neck stiffness. Negative for weakness, altered level of consciousness, altered mental status, extremity weakness, paresthesias, involuntary movement, seizure and syncope.      Physical Exam Updated Vital Signs BP (!) 128/54 (BP Location: Right Arm)   Pulse (!) 58   Temp 98 F (36.7 C) (Oral)   Resp 16   Ht 5\' 3"  (1.6 m)   Wt 83.9 kg (185 lb)   SpO2 100%   BMI 32.77 kg/m   Physical Exam 1055: Physical examination:  Nursing notes reviewed; Vital signs and O2 SAT reviewed;  Constitutional: Well developed, Well nourished, Well hydrated, In no acute distress; Head:  Normocephalic, atraumatic; Eyes: EOMI, PERRL, No scleral icterus; ENMT: Mouth and pharynx normal, Mucous membranes moist; Neck: Supple, Full range of motion, No lymphadenopathy; Cardiovascular: Regular rate and rhythm, No gallop; Respiratory: Breath sounds clear & equal bilaterally, No wheezes.  Speaking full sentences with ease, Normal respiratory effort/excursion; Chest: Nontender, Movement normal; Abdomen: Soft, +RUQ > mid-epigastric tenderness to palp. No rebound or guarding. Nondistended, Normal bowel sounds; Genitourinary: No CVA tenderness; Spine:  No  midline CS, TS, LS tenderness.;; Extremities: Pulses normal, No tenderness, No edema, No calf edema or asymmetry.; Neuro: AA&Ox3, Major CN grossly intact.  Speech clear. No gross focal motor or sensory deficits in extremities.; Skin: Color normal, Warm, Dry.   ED Treatments / Results  Labs (all labs ordered are listed, but only abnormal results are displayed)   EKG  EKG Interpretation None       Radiology   Procedures Procedures (including critical care time)  Medications Ordered in ED Medications  ondansetron (ZOFRAN) injection 4 mg (not administered)  morphine 2 MG/ML injection 2 mg (not administered)     Initial Impression / Assessment and Plan / ED Course  I have reviewed the triage vital signs and the nursing notes.  Pertinent labs & imaging results that were available during my care of the patient were reviewed by me and considered in my medical decision making (see chart for details).  MDM Reviewed: previous chart, nursing note and vitals Reviewed previous: labs Interpretation: labs, x-ray and CT  scan    Results for orders placed or performed during the hospital encounter of 10/09/16  Lipase, blood  Result Value Ref Range   Lipase 30 11 - 51 U/L  Comprehensive metabolic panel  Result Value Ref Range   Sodium 139 135 - 145 mmol/L   Potassium 4.6 3.5 - 5.1 mmol/L   Chloride 104 101 - 111 mmol/L   CO2 26 22 - 32 mmol/L   Glucose, Bld 148 (H) 65 - 99 mg/dL   BUN 26 (H) 6 - 20 mg/dL   Creatinine, Ser 1.01 (H) 0.44 - 1.00 mg/dL   Calcium 10.0 8.9 - 10.3 mg/dL   Total Protein 7.4 6.5 - 8.1 g/dL   Albumin 3.8 3.5 - 5.0 g/dL   AST 17 15 - 41 U/L   ALT 16 14 - 54 U/L   Alkaline Phosphatase 65 38 - 126 U/L   Total Bilirubin 0.6 0.3 - 1.2 mg/dL   GFR calc non Af Amer 50 (L) >60 mL/min   GFR calc Af Amer 58 (L) >60 mL/min   Anion gap 9 5 - 15  Urinalysis, Routine w reflex microscopic  Result Value Ref Range   Color, Urine YELLOW YELLOW   APPearance CLEAR  CLEAR   Specific Gravity, Urine 1.017 1.005 - 1.030   pH 6.0 5.0 - 8.0   Glucose, UA NEGATIVE NEGATIVE mg/dL   Hgb urine dipstick NEGATIVE NEGATIVE   Bilirubin Urine NEGATIVE NEGATIVE   Ketones, ur NEGATIVE NEGATIVE mg/dL   Protein, ur NEGATIVE NEGATIVE mg/dL   Nitrite NEGATIVE NEGATIVE   Leukocytes, UA NEGATIVE NEGATIVE  CBC with Differential  Result Value Ref Range   WBC 8.4 4.0 - 10.5 K/uL   RBC 3.45 (L) 3.87 - 5.11 MIL/uL   Hemoglobin 11.1 (L) 12.0 - 15.0 g/dL   HCT 33.0 (L) 36.0 - 46.0 %   MCV 95.7 78.0 - 100.0 fL   MCH 32.2 26.0 - 34.0 pg   MCHC 33.6 30.0 - 36.0 g/dL   RDW 14.5 11.5 - 15.5 %   Platelets 210 150 - 400 K/uL   Neutrophils Relative % 74 %   Neutro Abs 6.2 1.7 - 7.7 K/uL   Lymphocytes Relative 12 %   Lymphs Abs 1.0 0.7 - 4.0 K/uL   Monocytes Relative 8 %   Monocytes Absolute 0.7 0.1 - 1.0 K/uL   Eosinophils Relative 5 %   Eosinophils Absolute 0.4 0.0 - 0.7 K/uL   Basophils Relative 1 %   Basophils Absolute 0.0 0.0 - 0.1 K/uL   Dg Chest 2 View Result Date: 10/09/2016 CLINICAL DATA:  Right upper quadrant abdominal pain. EXAM: CHEST  2 VIEW COMPARISON:  09/03/2010 ; chest CT - 09/05/2010 FINDINGS: Grossly unchanged cardiac silhouette and mediastinal contours. Mild diffuse slightly nodular thickening of the pulmonary interstitium, most conspicuous within the bilateral lung apices. No focal airspace opacities. No pleural effusion or pneumothorax. Surgical clips overlie the left axilla. No acute osseus abnormalities. Stigmata of DISH within the thoracic spine. Degenerative change of the right glenohumeral joint, incompletely evaluated. IMPRESSION: Findings suggestive of airways disease / bronchitis. No focal airspace opacities to suggest pneumonia. Electronically Signed   By: Sandi Mariscal M.D.   On: 10/09/2016 11:31   US Abdomen Complete Result Date: 10/09/2016 CLINICAL DATA:  Right upper quadrant abdominal pain for the past week, worse since last night. EXAM: ABDOMEN  ULTRASOUND COMPLETE COMPARISON:  Abdomen CT dated 09/05/2010 and abdomen ultrasound dated 09/05/2010. FINDINGS: Gallbladder: No gallstones or wall thickening visualized.  No sonographic Murphy sign noted by sonographer. Common bile duct: Diameter: 4.9 mm Liver: Progressively diffusely increased echogenicity. IVC: No abnormality visualized. Pancreas: Visualized portion unremarkable. Spleen: Size and appearance within normal limits. Right Kidney: Length: 11.1 cm. Echogenicity within normal limits. No mass or hydronephrosis visualized. Left Kidney: Length: 12.0 cm. Echogenicity within normal limits. No mass or hydronephrosis visualized. Abdominal aorta: No aneurysm visualized. Other findings: None. IMPRESSION: 1. No acute abnormality. 2. Progressively echogenic liver, most likely due to hepatic steatosis. Electronically Signed   By: Claudie Revering M.D.   On: 10/09/2016 12:23   Ct Renal Stone Study Result Date: 10/09/2016 CLINICAL DATA:  Right upper quadrant abdominal pain for the past week, worse last night after eating. EXAM: CT ABDOMEN AND PELVIS WITHOUT CONTRAST TECHNIQUE: Multidetector CT imaging of the abdomen and pelvis was performed following the standard protocol without IV contrast. COMPARISON:  Abdomen ultrasound obtained earlier today. Abdomen and pelvis CT dated 09/05/2010. FINDINGS: Lower chest: Minimal right basilar atelectasis. Hepatobiliary: No focal liver abnormality is seen. No gallstones, gallbladder wall thickening, or biliary dilatation. Pancreas: Unremarkable. No pancreatic ductal dilatation or surrounding inflammatory changes. Spleen: Normal in size without focal abnormality. Adrenals/Urinary Tract: Adrenal glands are unremarkable. Kidneys are normal, without renal calculi, focal lesion, or hydronephrosis. Bladder is unremarkable. Stomach/Bowel: Small to moderate-sized hiatal hernia. Unremarkable small bowel and colon. Surgically absent appendix. Vascular/Lymphatic: Atheromatous arterial  calcifications, including the coronary arteries. No aneurysm or enlarged lymph nodes. Reproductive: Uterus and bilateral adnexa are unremarkable. Other: Interval postmastectomy changes on the left. Small umbilical hernia containing fat. Musculoskeletal: Lumbar and lower thoracic spine degenerative changes and mild scoliosis. IMPRESSION: 1. No acute abnormality. 2. Extensive calcified coronary artery atherosclerosis. 3. Small to moderate-sized hiatal hernia. Electronically Signed   By: Claudie Revering M.D.   On: 10/09/2016 13:23    1350:  Workup reassuring.  Has tol PO well while in the ED without N/V. No stooling while in the ED. VSS. Feels better and wants to go home now. Tx symptomatically, f/u PMD and GI MD. Dx and testing d/w pt and family.  Questions answered.  Verb understanding, agreeable to d/c home with outpt f/u.    Final Clinical Impressions(s) / ED Diagnoses   Final diagnoses:  None    New Prescriptions New Prescriptions   No medications on file     Francine Graven, DO 10/12/16 2151

## 2016-10-09 NOTE — ED Notes (Signed)
Pt reports that she ate fried flounder at a restaurant on Sunday- Her sister did also  Both she and sister got sick   She has been evaluated by her physician today in [place] but is here for further evaluation because they did not do any labs and they wish a further evaluation

## 2016-12-08 ENCOUNTER — Ambulatory Visit (HOSPITAL_COMMUNITY)
Admission: RE | Admit: 2016-12-08 | Discharge: 2016-12-08 | Disposition: A | Discharge status: Home / Self Care | Payer: Medicare Other | Source: Ambulatory Visit | Attending: Ophthalmology | Admitting: Ophthalmology

## 2016-12-08 ENCOUNTER — Encounter (HOSPITAL_COMMUNITY)
Admission: RE | Disposition: A | Discharge status: Home / Self Care | Payer: Self-pay | Source: Ambulatory Visit | Attending: Ophthalmology

## 2016-12-08 DIAGNOSIS — Z888 Allergy status to other drugs, medicaments and biological substances status: Secondary | ICD-10-CM | POA: Diagnosis not present

## 2016-12-08 DIAGNOSIS — H2640 Unspecified secondary cataract: Secondary | ICD-10-CM | POA: Diagnosis present

## 2016-12-08 HISTORY — PX: YAG LASER APPLICATION: SHX6189

## 2016-12-08 SURGERY — TREATMENT, USING YAG LASER
Anesthesia: LOCAL | Laterality: Left

## 2016-12-08 MED ORDER — CYCLOPENTOLATE-PHENYLEPHRINE 0.2-1 % OP SOLN
OPHTHALMIC | Status: AC
  Filled 2016-12-08: qty 2

## 2016-12-08 MED ORDER — CYCLOPENTOLATE-PHENYLEPHRINE 0.2-1 % OP SOLN
1.0000 [drp] | OPHTHALMIC | Status: AC
  Administered 2016-12-08 (×3): 1 [drp] via OPHTHALMIC

## 2016-12-08 NOTE — H&P (Signed)
The patient was re examined and there is no change in the patients condition since the original H and P. 

## 2016-12-08 NOTE — Discharge Instructions (Signed)
Stacy [name]  12/08/2016     Instructions    Activity: No Restrictions.   Diet: Resume Diet you were on at home.   Pain Medication: Tylenol if Needed.   CONTACT YOUR DOCTOR IF YOU HAVE PAIN, REDNESS IN YOUR EYE, OR DECREASED VISION.   Follow-up:today with Rutherford Guys, MD.   Dr. Gershon Crane: [phone]  Dr. Iona Hansen: [phone]  Dr. Geoffry Paradise: [phone]   If you find that you cannot contact your physician, but feel that your signs and   Symptoms warrant a physician's attention, call the Emergency Room at   [phone] [phone].   Othern/a.  FOLLOW UP TODAY WITH DR Gershon Crane BETWEEN 1-1:30 PM

## 2016-12-08 NOTE — Op Note (Signed)
Stacy Muise T. Gershon Crane, MD  Procedure: Yag Capsulotomy  Yag Laser Self Test Completedyes. Procedure: Posterior Capsulotomy, Eye Protection Worn by Staff yes. Laser In Use Sign on Door yes.  Laser: Nd:YAG Spot Size: Fixed Burst Mode: III Power Setting: 3.4 mJ/burst Number of shots: 32 Total energy delivered: 102.25 mJ   The patient tolerated the procedure without difficulty. No complications were encountered.   The patient was discharged home with the instructions to continue all her current glaucoma medications, if any.   Patient instructed to go to office at 0100 for intraocular pressure check.  Patient verbalizes understanding of discharge instructions Yes.  .    Pre-Operative Diagnosis: After-Cataract, obscuring vision, 366.53 OS Post-Operative Diagnosis: After-Cataract, obscuring vision, 366.53 OS Date of Cataract Surgery: 10/14/10

## 2016-12-09 ENCOUNTER — Encounter (HOSPITAL_COMMUNITY): Payer: Self-pay | Admitting: Ophthalmology

## 2016-12-24 ENCOUNTER — Other Ambulatory Visit (HOSPITAL_COMMUNITY): Payer: Self-pay | Admitting: *Deleted

## 2016-12-24 DIAGNOSIS — F329 Major depressive disorder, single episode, unspecified: Secondary | ICD-10-CM

## 2016-12-24 MED ORDER — ESCITALOPRAM OXALATE 10 MG PO TABS: 10.0000 mg | ORAL_TABLET | Freq: Every day | ORAL | 1 refills | Status: DC

## 2016-12-28 ENCOUNTER — Ambulatory Visit (HOSPITAL_COMMUNITY)
Admission: RE | Admit: 2016-12-28 | Discharge: 2016-12-28 | Disposition: A | Discharge status: Home / Self Care | Payer: Medicare Other | Source: Ambulatory Visit | Attending: Oncology | Admitting: Oncology

## 2016-12-28 ENCOUNTER — Encounter (HOSPITAL_COMMUNITY): Payer: Self-pay

## 2016-12-28 ENCOUNTER — Encounter (HOSPITAL_COMMUNITY): Payer: Medicare Other | Attending: Oncology

## 2016-12-28 ENCOUNTER — Encounter (HOSPITAL_COMMUNITY): Payer: Medicare Other | Attending: Oncology | Admitting: Oncology

## 2016-12-28 VITALS — BP 149/61 | HR 57 | Temp 97.6°F | Resp 18 | Wt 205.2 lb

## 2016-12-28 DIAGNOSIS — N183 Chronic kidney disease, stage 3 unspecified: Secondary | ICD-10-CM

## 2016-12-28 DIAGNOSIS — C50912 Malignant neoplasm of unspecified site of left female breast: Secondary | ICD-10-CM

## 2016-12-28 DIAGNOSIS — D649 Anemia, unspecified: Secondary | ICD-10-CM

## 2016-12-28 DIAGNOSIS — D631 Anemia in chronic kidney disease: Secondary | ICD-10-CM

## 2016-12-28 DIAGNOSIS — Z79811 Long term (current) use of aromatase inhibitors: Secondary | ICD-10-CM

## 2016-12-28 DIAGNOSIS — G629 Polyneuropathy, unspecified: Secondary | ICD-10-CM

## 2016-12-28 DIAGNOSIS — N189 Chronic kidney disease, unspecified: Secondary | ICD-10-CM | POA: Diagnosis not present

## 2016-12-28 DIAGNOSIS — Z170 Estrogen receptor positive status [ER+]: Secondary | ICD-10-CM | POA: Diagnosis not present

## 2016-12-28 DIAGNOSIS — Z1231 Encounter for screening mammogram for malignant neoplasm of breast: Secondary | ICD-10-CM | POA: Diagnosis present

## 2016-12-28 DIAGNOSIS — Z0000 Encounter for general adult medical examination without abnormal findings: Secondary | ICD-10-CM

## 2016-12-28 LAB — CBC WITH DIFFERENTIAL/PLATELET
BASOS ABS: 0 10*3/uL (ref 0.0–0.1)
BASOS PCT: 1 %
EOS PCT: 4 %
Eosinophils Absolute: 0.3 10*3/uL (ref 0.0–0.7)
HCT: 34.3 % — ABNORMAL LOW (ref 36.0–46.0)
Hemoglobin: 11.1 g/dL — ABNORMAL LOW (ref 12.0–15.0)
LYMPHS PCT: 14 %
Lymphs Abs: 0.9 10*3/uL (ref 0.7–4.0)
MCH: 31.7 pg (ref 26.0–34.0)
MCHC: 32.4 g/dL (ref 30.0–36.0)
MCV: 98 fL (ref 78.0–100.0)
MONO ABS: 0.7 10*3/uL (ref 0.1–1.0)
Monocytes Relative: 11 %
Neutro Abs: 4.8 10*3/uL (ref 1.7–7.7)
Neutrophils Relative %: 70 %
PLATELETS: 161 10*3/uL (ref 150–400)
RBC: 3.5 MIL/uL — AB (ref 3.87–5.11)
RDW: 14.3 % (ref 11.5–15.5)
WBC: 6.7 10*3/uL (ref 4.0–10.5)

## 2016-12-28 LAB — COMPREHENSIVE METABOLIC PANEL
ALBUMIN: 3.9 g/dL (ref 3.5–5.0)
ALT: 33 U/L (ref 14–54)
AST: 27 U/L (ref 15–41)
Alkaline Phosphatase: 75 U/L (ref 38–126)
Anion gap: 8 (ref 5–15)
BUN: 32 mg/dL — ABNORMAL HIGH (ref 6–20)
CALCIUM: 9.5 mg/dL (ref 8.9–10.3)
CO2: 25 mmol/L (ref 22–32)
CREATININE: 1.25 mg/dL — AB (ref 0.44–1.00)
Chloride: 105 mmol/L (ref 101–111)
GFR, EST AFRICAN AMERICAN: 45 mL/min — AB (ref >60)
GFR, EST NON AFRICAN AMERICAN: 39 mL/min — AB (ref >60)
Glucose, Bld: 120 mg/dL — ABNORMAL HIGH (ref 65–99)
Potassium: 4.6 mmol/L (ref 3.5–5.1)
SODIUM: 138 mmol/L (ref 135–145)
TOTAL PROTEIN: 6.8 g/dL (ref 6.5–8.1)
Total Bilirubin: 0.6 mg/dL (ref 0.3–1.2)

## 2016-12-28 LAB — RETICULOCYTES
RBC.: 3.5 MIL/uL — ABNORMAL LOW (ref 3.87–5.11)
RETIC COUNT ABSOLUTE: 70 10*3/uL (ref 19.0–186.0)
Retic Ct Pct: 2 % (ref 0.4–3.1)

## 2016-12-28 LAB — IRON AND TIBC
Iron: 70 ug/dL (ref 28–170)
Saturation Ratios: 22 % (ref 10.4–31.8)
TIBC: 315 ug/dL (ref 250–450)
UIBC: 245 ug/dL

## 2016-12-28 LAB — FERRITIN: Ferritin: 117 ng/mL (ref 11–307)

## 2016-12-28 LAB — VITAMIN B12: VITAMIN B 12: 633 pg/mL (ref 180–914)

## 2016-12-28 LAB — LACTATE DEHYDROGENASE: LDH: 148 U/L (ref 98–192)

## 2016-12-28 LAB — FOLATE: FOLATE: 90 ng/mL (ref >5.9)

## 2016-12-28 LAB — C-REACTIVE PROTEIN

## 2016-12-28 LAB — SEDIMENTATION RATE: SED RATE: 30 mm/h — AB (ref 0–22)

## 2016-12-28 MED ORDER — LETROZOLE 2.5 MG PO TABS: 2.5000 mg | ORAL_TABLET | Freq: Every day | ORAL | 1 refills | Status: DC

## 2016-12-28 NOTE — Progress Notes (Signed)
Dhivianathan, Candida Peeling, MD [address] [place] [address]  Invasive ductal carcinoma of left breast [facility]) - Plan: CBC with Differential, Comprehensive metabolic panel, DG Bone Density  Aromatase inhibitor use - Plan: DG Bone Density  CURRENT THERAPY: Letrozole 2.5 mg daily beginning on 09/19/2010 neoadjuvantly and then restarted in October 2012 following surgery in the adjuvant setting.  INTERVAL HISTORY: Stacy [name] 83 y.o. female returns for followup of advanced stage IIIA, invasive ductal carcinoma of left breast presenting with a large mass, retraction of the nipple, 6 x 6 cm clinically with suspicious lymph nodes in the left axilla presentation. ER +100%, PR +100%, Ki-67 marker 66%, LV I was seen, but was felt to be grade 1. HER-2/neu was nonamplified. We treated her with neoadjuvant letrozole for several months. She then underwent left modified radical mastectomy on 01/09/2011 but still had a 4.8 cm cancer. Clinically there was reduction in the size of the tumor. 4 of 4 lymph nodes were involved. She then completed radiation therapy in Feb 2013 postoperatively and was restarted on the letrozole.     Patient statet overall she has been dong well. However she has not taken her letrozole for 2 months now because she ran out of her prescription and did not have a refill sent to her [facility] so she thought she was no longer supposed to take her letrozole.  She denies any side effectsfrom letrozole including hot flashes, arthralgias, and myalgias.  She is compliant with calcium and vitamin D as well. 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 She is due for her right breast mammogram today. She has not had her bone density test performed recently.   Review of Systems  Constitutional: Negative.  Negative for chills, fever and weight  loss.  HENT: Negative.   Eyes: Negative.   Respiratory: Negative.  Negative for cough.   Cardiovascular: Positive for leg swelling (chronic left foot swelling). Negative for chest pain.  Gastrointestinal: Negative.  Negative for blood in stool, constipation, diarrhea, melena, nausea and vomiting.  Genitourinary: Negative.   Musculoskeletal: Negative.   Skin: Negative.   Neurological: Negative.  Negative for weakness.       Neuropathy in left foot from previous shingles  Endo/Heme/Allergies: Negative.   Psychiatric/Behavioral: Negative.     Past Medical History:  Diagnosis Date  . Anemia   . Anxiety   . Cancer ([place])   . Depression   . Diabetes mellitus type II   . Fracture of hand 09/29/12   fell and fractured right hand  . Hyperlipemia   . Hypertension   . Invasive ductal carcinoma of left breast ([place]) 10/03/2010  . Normocytic normochromic anemia 06/23/2016    Past Surgical History:  Procedure Laterality Date  . APPENDECTOMY    . CATARACT EXTRACTION W/ INTRAOCULAR LENS IMPLANT  08/26/10   Right; [facility]  . CATARACT EXTRACTION W/PHACO  10/14/2010   Procedure: CATARACT EXTRACTION PHACO AND INTRAOCULAR LENS PLACEMENT (IOC);  Surgeon: Elta Guadeloupe T. Gershon Crane;  Location: [facility];  Service: Ophthalmology;  Laterality: Left;  CDE:  8.79  . INCISIONAL BREAST BIOPSY  09/05/10   Left, [facility], [facility]  . MASTECTOMY    . YAG LASER APPLICATION Left 5/83/0940   Procedure: YAG LASER APPLICATION;  Surgeon: Rutherford Guys, MD;  Location: [facility];  Service: Ophthalmology;  Laterality:  Left;    Family History  Problem Relation Age of Onset  . Anesthesia problems Neg Hx     Social History   Social History  . Marital status: Married    Spouse name: N/A  . Number of children: N/A  . Years of education: N/A   Social History Main Topics  . Smoking status: Former Research scientist (life sciences)  . Smokeless tobacco: Never Used  . Alcohol use No  . Drug use: No  . Sexual activity: Not Currently   Other Topics Concern    . None   Social History Narrative  . None     PHYSICAL EXAMINATION  ECOG PERFORMANCE STATUS: 0 - Asymptomatic  Vitals:   12/28/16 0937  BP: (!) 149/61  Pulse: (!) 57  Resp: 18  Temp: 97.6 F (36.4 C)  SpO2: 100%    GENERAL:alert, no distress, well nourished, well developed, comfortable, cooperative, obese, smiling and unaccompanied today. SKIN: skin color, texture, turgor are normal, no rashes or significant lesions HEAD: Normocephalic, No masses, lesions, tenderness or abnormalities EYES: normal, EOMI, Conjunctiva are pink and non-injected EARS: External ears normal OROPHARYNX:lips, buccal mucosa, and tongue normal and mucous membranes are moist  NECK: supple, no adenopathy, thyroid normal size, non-tender, without nodularity, trachea midline LYMPH:  no palpable lymphadenopathy BREAST:not examined LUNGS: clear to auscultation and percussion without wheezes, rales, or rhonchi. HEART: regular rate & rhythm, no murmurs, no gallops, S1 normal and S2 normal ABDOMEN:abdomen soft, non-tender, obese and normal bowel sounds BACK: Back symmetric, no curvature. EXTREMITIES:less then 2 second capillary refill, no joint deformities, effusion, or inflammation, no skin discoloration, no cyanosis  NEURO: alert & oriented x 3 with fluent speech, no focal motor/sensory deficits, gait normal   LABORATORY DATA: CBC    Component Value Date/Time   WBC 6.7 12/28/2016 0909   RBC 3.50 (L) 12/28/2016 0909   RBC 3.50 (L) 12/28/2016 0909   HGB 11.1 (L) 12/28/2016 0909   HCT 34.3 (L) 12/28/2016 0909   PLT 161 12/28/2016 0909   MCV 98.0 12/28/2016 0909   MCH 31.7 12/28/2016 0909   MCHC 32.4 12/28/2016 0909   RDW 14.3 12/28/2016 0909   LYMPHSABS 0.9 12/28/2016 0909   MONOABS 0.7 12/28/2016 0909   EOSABS 0.3 12/28/2016 0909   BASOSABS 0.0 12/28/2016 0909      Chemistry      Component Value Date/Time   NA 138 12/28/2016 0909   K 4.6 12/28/2016 0909   CL 105 12/28/2016 0909   CO2 25  12/28/2016 0909   BUN 32 (H) 12/28/2016 0909   CREATININE 1.25 (H) 12/28/2016 0909      Component Value Date/Time   CALCIUM 9.5 12/28/2016 0909   ALKPHOS 75 12/28/2016 0909   AST 27 12/28/2016 0909   ALT 33 12/28/2016 0909   BILITOT 0.6 12/28/2016 0909       ASSESSMENT AND PLAN:  1.  Stage IIIA, invasive ductal carcinoma of left breast presenting with a large mass, retraction of the nipple, 6 x 6 cm clinically with suspicious lymph nodes in the left axilla presentation. ER +100%, PR +100%, Ki-67 marker 66%, LV I was seen, but was felt to be grade 1. HER-2/neu was nonamplified. We treated her with neoadjuvant letrozole for several months. She then underwent left modified radical mastectomy on 01/09/2011 but still had a 4.8 cm cancer. Clinically there was reduction in the size of the tumor. 4 of 4 lymph nodes were involved. She then completed radiation therapy in Feb 2013 postoperatively and was  restarted on the letrozole.  BCI not able to be performed due to her nodal status.  -continue letrozole at this time. I have sent in a new prescription to her [facility]. -Continue calcium and vitamin D. I have ordered a new DEXA scan. -She will be getting her annul right breast mammogram today. Breast exam deferred today since she will be getting her mammogram, patient agrees.  2. Anemia in the setting of chronic renal disease. -Reviewed her lab work in detail with her today. Her hemoglobin stable at 11 g/dL. She does not need to start ESA at this time. Continue to monitor with repeat blood work on her next visit.  RTC in 6 months for follow up with labs.   ORDERS PLACED FOR THIS ENCOUNTER: Orders Placed This Encounter  Procedures  . DG Bone Density  . CBC with Differential  . Comprehensive metabolic panel    MEDICATIONS PRESCRIBED THIS ENCOUNTER: Meds ordered this encounter  Medications  . letrozole (FEMARA) 2.5 MG tablet    Sig: Take 1 tablet (2.5 mg total) by mouth daily.     Dispense:  90 tablet    Refill:  1    THERAPY PLAN:  NCCN guidelines recommends the following surveillance for invasive breast cancer (2.2017):  A. History and Physical exam 1-4 times per year as clinically appropriate for 5 years, then annually.  B. Periodic screening for changes in family history and referral to genetics counseling as indicated  C. Educate, monitor, and refer to lymphedema management.  D. Mammography every 12 months  E. Routine imaging of reconstructed breast is not indicated.  F. In the absence of clinical signs and symptoms suggestive of recurrent disease, there is no indication for laboratory or imaging studies for metastases screening.  G. Women on Tamoxifen: annual gynecologic assessment every 12 months if uterus is present.  H. Women on aromatase inhibitor or who experience ovarian failure secondary to treatment should have monitoring of bone health with a bone mineral density determination at baseline and periodically thereafter.  I. Assess and encourage adherence to adjuvant endocrine therapy.  J. Evidence suggests that active lifestyle, healthy diet, limited alcohol intake, and achieving and maintaining an ideal body weight (20-25 BMI) may lead to optimal breast cancer outcomes.   All questions were answered. The patient knows to call the clinic with any problems, questions or concerns. We can certainly see the patient much sooner if necessary.    This note is electronically signed by: Twana First, MD 12/28/2016 10:07 AM

## 2016-12-28 NOTE — Patient Instructions (Signed)
[facility] at [facility]  Discharge Instructions: You saw Dr. Zhou today.  _______________________________________________________________  Thank you for choosing [facility] at [facility] to provide your oncology and hematology care.  To afford each patient quality time with our providers, please arrive at least 15 minutes before your scheduled appointment.  You need to re-schedule your appointment if you arrive 10 or more minutes late.  We strive to give you quality time with our providers, and arriving late affects you and other patients whose appointments are after yours.  Also, if you no show three or more times for appointments you may be dismissed from the clinic.  Again, thank you for choosing [facility] at [facility]. Our hope is that these requests will allow you access to exceptional care and in a timely manner. _______________________________________________________________  If you have questions after your visit, please contact our office at [phone] between the hours of 8:30 a.m. and 5:00 p.m. Voicemails left after 4:30 p.m. will not be returned until the following business day. _______________________________________________________________  For prescription refill requests, have your pharmacy contact our office. _______________________________________________________________  Recommendations made by the consultant and any test results will be sent to your referring physician. _______________________________________________________________ 

## 2016-12-29 LAB — HAPTOGLOBIN: HAPTOGLOBIN: 210 mg/dL — AB (ref 34–200)

## 2016-12-29 LAB — ERYTHROPOIETIN: ERYTHROPOIETIN: 14 m[IU]/mL (ref 2.6–18.5)

## 2017-01-04 ENCOUNTER — Ambulatory Visit (HOSPITAL_COMMUNITY)
Admission: RE | Admit: 2017-01-04 | Discharge: 2017-01-04 | Disposition: A | Discharge status: Home / Self Care | Payer: Medicare Other | Source: Ambulatory Visit | Attending: Oncology | Admitting: Oncology

## 2017-01-04 DIAGNOSIS — Z79811 Long term (current) use of aromatase inhibitors: Secondary | ICD-10-CM | POA: Insufficient documentation

## 2017-01-04 DIAGNOSIS — M8580 Other specified disorders of bone density and structure, unspecified site: Secondary | ICD-10-CM | POA: Insufficient documentation

## 2017-01-04 DIAGNOSIS — C50912 Malignant neoplasm of unspecified site of left female breast: Secondary | ICD-10-CM | POA: Insufficient documentation

## 2017-01-04 DIAGNOSIS — Z780 Asymptomatic menopausal state: Secondary | ICD-10-CM | POA: Diagnosis not present

## 2017-01-04 NOTE — Progress Notes (Signed)
Jenn, can you call the patient and let her know her osteopenia looks worse on her DEXA scan. Ask if she would like to start prolia q6 months?

## 2017-01-08 ENCOUNTER — Other Ambulatory Visit (HOSPITAL_COMMUNITY): Payer: Self-pay | Admitting: *Deleted

## 2017-01-08 DIAGNOSIS — F329 Major depressive disorder, single episode, unspecified: Secondary | ICD-10-CM

## 2017-01-08 MED ORDER — ESCITALOPRAM OXALATE 10 MG PO TABS: 10.0000 mg | ORAL_TABLET | Freq: Every day | ORAL | 1 refills | Status: DC

## 2017-03-30 ENCOUNTER — Telehealth (HOSPITAL_COMMUNITY): Payer: Self-pay | Admitting: *Deleted

## 2017-03-31 ENCOUNTER — Other Ambulatory Visit (HOSPITAL_COMMUNITY): Payer: Self-pay

## 2017-03-31 DIAGNOSIS — C50912 Malignant neoplasm of unspecified site of left female breast: Secondary | ICD-10-CM

## 2017-03-31 MED ORDER — LETROZOLE 2.5 MG PO TABS: 2.5000 mg | ORAL_TABLET | Freq: Every day | ORAL | 1 refills | Status: DC

## 2017-03-31 NOTE — Telephone Encounter (Signed)
Patient called for refill on Femara and [facility]. Reviewed with provider, chart checked and refilled.

## 2017-06-28 ENCOUNTER — Ambulatory Visit (HOSPITAL_COMMUNITY): Payer: Medicare Other | Admitting: Internal Medicine

## 2017-06-28 ENCOUNTER — Ambulatory Visit (HOSPITAL_COMMUNITY): Payer: Medicare Other

## 2017-06-28 ENCOUNTER — Other Ambulatory Visit (HOSPITAL_COMMUNITY): Payer: Medicare Other

## 2017-07-07 ENCOUNTER — Inpatient Hospital Stay (HOSPITAL_BASED_OUTPATIENT_CLINIC_OR_DEPARTMENT_OTHER): Payer: Medicare Other | Admitting: Internal Medicine

## 2017-07-07 ENCOUNTER — Encounter (HOSPITAL_COMMUNITY): Payer: Self-pay | Admitting: Internal Medicine

## 2017-07-07 ENCOUNTER — Ambulatory Visit (HOSPITAL_COMMUNITY): Payer: Medicare Other | Admitting: Internal Medicine

## 2017-07-07 ENCOUNTER — Inpatient Hospital Stay (HOSPITAL_COMMUNITY): Payer: Medicare Other | Attending: Hematology

## 2017-07-07 VITALS — BP 145/60 | HR 51 | Temp 97.4°F | Resp 18 | Wt 208.5 lb

## 2017-07-07 DIAGNOSIS — Z9012 Acquired absence of left breast and nipple: Secondary | ICD-10-CM | POA: Diagnosis not present

## 2017-07-07 DIAGNOSIS — Z87891 Personal history of nicotine dependence: Secondary | ICD-10-CM

## 2017-07-07 DIAGNOSIS — N189 Chronic kidney disease, unspecified: Secondary | ICD-10-CM | POA: Diagnosis not present

## 2017-07-07 DIAGNOSIS — D631 Anemia in chronic kidney disease: Secondary | ICD-10-CM | POA: Diagnosis not present

## 2017-07-07 DIAGNOSIS — C50912 Malignant neoplasm of unspecified site of left female breast: Secondary | ICD-10-CM | POA: Insufficient documentation

## 2017-07-07 DIAGNOSIS — C773 Secondary and unspecified malignant neoplasm of axilla and upper limb lymph nodes: Secondary | ICD-10-CM | POA: Insufficient documentation

## 2017-07-07 DIAGNOSIS — Z170 Estrogen receptor positive status [ER+]: Secondary | ICD-10-CM

## 2017-07-07 DIAGNOSIS — I129 Hypertensive chronic kidney disease with stage 1 through stage 4 chronic kidney disease, or unspecified chronic kidney disease: Secondary | ICD-10-CM | POA: Diagnosis not present

## 2017-07-07 DIAGNOSIS — Z79811 Long term (current) use of aromatase inhibitors: Secondary | ICD-10-CM

## 2017-07-07 DIAGNOSIS — M7989 Other specified soft tissue disorders: Secondary | ICD-10-CM | POA: Insufficient documentation

## 2017-07-07 DIAGNOSIS — E1122 Type 2 diabetes mellitus with diabetic chronic kidney disease: Secondary | ICD-10-CM | POA: Diagnosis not present

## 2017-07-07 LAB — CBC WITH DIFFERENTIAL/PLATELET
BASOS ABS: 0 10*3/uL (ref 0.0–0.1)
BASOS PCT: 1 %
EOS PCT: 4 %
Eosinophils Absolute: 0.2 10*3/uL (ref 0.0–0.7)
HCT: 33.8 % — ABNORMAL LOW (ref 36.0–46.0)
Hemoglobin: 11 g/dL — ABNORMAL LOW (ref 12.0–15.0)
Lymphocytes Relative: 15 %
Lymphs Abs: 0.7 10*3/uL (ref 0.7–4.0)
MCH: 31.8 pg (ref 26.0–34.0)
MCHC: 32.5 g/dL (ref 30.0–36.0)
MCV: 97.7 fL (ref 78.0–100.0)
MONO ABS: 0.6 10*3/uL (ref 0.1–1.0)
Monocytes Relative: 12 %
NEUTROS ABS: 3.5 10*3/uL (ref 1.7–7.7)
Neutrophils Relative %: 68 %
Platelets: 159 10*3/uL (ref 150–400)
RBC: 3.46 MIL/uL — ABNORMAL LOW (ref 3.87–5.11)
RDW: 14.4 % (ref 11.5–15.5)
WBC: 5.1 10*3/uL (ref 4.0–10.5)

## 2017-07-07 LAB — COMPREHENSIVE METABOLIC PANEL
ALBUMIN: 3.9 g/dL (ref 3.5–5.0)
ALT: 22 U/L (ref 14–54)
AST: 25 U/L (ref 15–41)
Alkaline Phosphatase: 61 U/L (ref 38–126)
Anion gap: 12 (ref 5–15)
BUN: 24 mg/dL — AB (ref 6–20)
CHLORIDE: 103 mmol/L (ref 101–111)
CO2: 23 mmol/L (ref 22–32)
Calcium: 10 mg/dL (ref 8.9–10.3)
Creatinine, Ser: 1.23 mg/dL — ABNORMAL HIGH (ref 0.44–1.00)
GFR calc Af Amer: 46 mL/min — ABNORMAL LOW (ref >60)
GFR calc non Af Amer: 39 mL/min — ABNORMAL LOW (ref >60)
GLUCOSE: 204 mg/dL — AB (ref 65–99)
POTASSIUM: 4.4 mmol/L (ref 3.5–5.1)
Sodium: 138 mmol/L (ref 135–145)
Total Bilirubin: 0.5 mg/dL (ref 0.3–1.2)
Total Protein: 6.8 g/dL (ref 6.5–8.1)

## 2017-07-07 NOTE — Progress Notes (Signed)
Diagnosis Invasive ductal carcinoma of left breast ([place]) - Plan: MM Digital Screening Unilat R  Staging Cancer Staging Invasive ductal carcinoma of left breast ([place]) Staging form: Breast, AJCC 7th Edition - Clinical: Stage IIIA (T2, N2a, cM0) - Signed by Baird Cancer, PA-C on 10/13/2012 - Pathologic: No stage assigned - Unsigned   Assessment and Plan:  1.  Stage IIIA, invasive ductal carcinoma of left breast Previously followed by Dr. Talbert Cage.  Pt presented with a large mass, retraction of the nipple, 6 x 6 cm clinically with suspicious lymph nodes in the left axilla presentation. ER +100%, PR +100%, Ki-67 marker 66%, LV I was seen, but was felt to be grade 1. HER-2/neu was nonamplified. We treated her with neoadjuvant letrozole for several months. She then underwent left modified radical mastectomy on 01/09/2011 but still had a 4.8 cm cancer. Clinically there was reduction in the size of the tumor. 4 of 4 lymph nodes were involved. She then completed radiation therapy in Feb 2013 postoperatively and was restarted on the letrozole.  BCI not able to be performed due to her nodal status.  She remains on Femara with an initial goal of 5 years that may extend to 10 years if tolerating therapy.  Right screening mammogram done 12/28/2016 was negative.  Patient will be set up for right screening mammogram October 2019 and will follow-up at that time to go over the results.  2. Anemia in the setting of chronic renal disease.  Labs done 07/07/2017 white count 5.1 hemoglobin 11 platelets 159,000.  Creatinine is 1.23.  We will continue to monitor labs.    3.  HTN.  Blood pressure 145/60.  Follow-up PCP.  4.  Ankle swelling.  She was given option of cardiology referral.  She is aware of cardiology in [place] which is where she lives.  She will discuss this further with her PCP.  Interval History:  Stage IIIA, invasive ductal carcinoma of left breast Previously followed by Dr. Talbert Cage.  Pt presented with a  large mass, retraction of the nipple, 6 x 6 cm clinically with suspicious lymph nodes in the left axilla presentation. ER +100%, PR +100%, Ki-67 marker 66%, LV I was seen, but was felt to be grade 1. HER-2/neu was nonamplified. We treated her with neoadjuvant letrozole for several months. She then underwent left modified radical mastectomy on 01/09/2011 but still had a 4.8 cm cancer. Clinically there was reduction in the size of the tumor. 4 of 4 lymph nodes were involved. She then completed radiation therapy in Feb 2013 postoperatively and was restarted on the letrozole.  BCI not able to be performed due to her nodal status.  Current Status: Patient is seen today for follow-up.  She is complaining of some ankle swelling.  Problem List Patient Active Problem List   Diagnosis Date Noted  . Normocytic normochromic anemia [D64.9] 06/23/2016  . Chronic renal disease, stage 3, moderately decreased glomerular filtration rate (GFR) between 30-59 mL/min/1.73 square meter ([place]) [N18.3] 06/23/2016  . Aromatase inhibitor use [Z79.811] 06/23/2016  . Reactive depression [F32.9] 06/23/2016  . Lymphedema [I89.0] 05/08/2013  . Invasive ductal carcinoma of left breast [facility]) [C50.912] 10/03/2010    Past Medical History Past Medical History:  Diagnosis Date  . Anemia   . Anxiety   . Cancer ([place])   . Depression   . Diabetes mellitus type II   . Fracture of hand 09/29/12   fell and fractured right hand  . Hyperlipemia   . Hypertension   .  Invasive ductal carcinoma of left breast ([place]) 10/03/2010  . Normocytic normochromic anemia 06/23/2016    Past Surgical History Past Surgical History:  Procedure Laterality Date  . APPENDECTOMY    . CATARACT EXTRACTION W/ INTRAOCULAR LENS IMPLANT  08/26/10   Right; [facility]  . CATARACT EXTRACTION W/PHACO  10/14/2010   Procedure: CATARACT EXTRACTION PHACO AND INTRAOCULAR LENS PLACEMENT ([facility]);  Surgeon: Elta Guadeloupe T. [facility];  Location: [facility];  Service: Ophthalmology;   Laterality: Left;  CDE:  8.79  . INCISIONAL BREAST BIOPSY  09/05/10   Left, [facility], [place]  . MASTECTOMY    . YAG LASER APPLICATION Left 1/93/7902   Procedure: YAG LASER APPLICATION;  Surgeon: Rutherford Guys, MD;  Location: [facility];  Service: Ophthalmology;  Laterality: Left;    Family History Family History  Problem Relation Age of Onset  . Anesthesia problems Neg Hx      Social History  reports that she has quit smoking. She has never used smokeless tobacco. She reports that she does not drink alcohol or use drugs.  Medications  Current Outpatient Medications:  .  aspirin 81 MG tablet, Take 81 mg by mouth daily.  , Disp: , Rfl:  .  atorvastatin (LIPITOR) 20 MG tablet, Take 1 tablet by mouth daily., Disp: , Rfl:  .  Calcium Carbonate-Vitamin D (CALCIUM 600+D) 600-400 MG-UNIT per tablet, Take 1 tablet by mouth daily.  , Disp: , Rfl:  .  cholecalciferol (VITAMIN D) 1000 UNITS tablet, Take 1,000 Units by mouth daily.  , Disp: , Rfl:  .  escitalopram (LEXAPRO) 10 MG tablet, Take 1 tablet (10 mg total) by mouth daily., Disp: 90 tablet, Rfl: 1 .  famotidine (PEPCID) 20 MG tablet, Take 1 tablet (20 mg total) by mouth 2 (two) times daily., Disp: 30 tablet, Rfl: 0 .  HYDROcodone-acetaminophen (NORCO/VICODIN) 5-325 MG tablet, Take 1 tablet by mouth every 4 (four) hours as needed., Disp: 25 tablet, Rfl: 0 .  letrozole (FEMARA) 2.5 MG tablet, Take 1 tablet (2.5 mg total) by mouth daily., Disp: 90 tablet, Rfl: 1 .  lisinopril (PRINIVIL,ZESTRIL) 20 MG tablet, Take 1 tablet by mouth every evening., Disp: , Rfl:  .  metFORMIN (GLUCOPHAGE) 500 MG tablet, Take 500 mg by mouth every evening. , Disp: , Rfl:  .  Multiple Vitamin (MULTIVITAMIN) tablet, Take 1 tablet by mouth daily.  , Disp: , Rfl:   Allergies Ciprofloxacin; Ferrous sulfate; and Sulfa drugs cross reactors  Review of Systems Review of Systems - Oncology ROS as per HPI otherwise 12 point ROS is negative other than ankle swelling.      Physical Exam  Vitals Wt Readings from Last 3 Encounters:  07/07/17 208 lb 8 oz (94.6 kg)  12/28/16 205 lb 3.2 oz (93.1 kg)  10/09/16 185 lb (83.9 kg)   Temp Readings from Last 3 Encounters:  07/07/17 (!) 97.4 F (36.3 C) (Oral)  12/28/16 97.6 F (36.4 C) (Oral)  12/08/16 (!) 97.5 F (36.4 C) (Oral)   BP Readings from Last 3 Encounters:  07/07/17 (!) 145/60  12/28/16 (!) 149/61  12/08/16 (!) 106/41   Pulse Readings from Last 3 Encounters:  07/07/17 (!) 51  12/28/16 (!) 57  12/08/16 (!) 48   Constitutional: Well-developed, well-nourished, and in no distress.   HENT: Head: Normocephalic and atraumatic.  Mouth/Throat: No oropharyngeal exudate. Mucosa moist. Eyes: Pupils are equal, round, and reactive to light. Conjunctivae are normal. No scleral icterus.  Neck: Normal range of motion. Neck supple. No JVD present.  Cardiovascular: Normal rate, regular rhythm and normal heart sounds.  Exam reveals no gallop and no friction rub.   No murmur heard. Pulmonary/Chest: Effort normal and breath sounds normal. No respiratory distress. No wheezes.No rales.  Abdominal: Soft. Bowel sounds are normal. No distension. There is no tenderness. There is no guarding.  Musculoskeletal: bilateral ankle swelling.   Lymphadenopathy: No cervica, axillaryl or supraclavicular adenopathy.  Neurological: Alert and oriented to person, place, and time. No cranial nerve deficit.  Skin: Skin is warm and dry. No rash noted. No erythema. No pallor.  Psychiatric: Affect and judgment normal.  Bilateral breast exam: Left mastectomy healed well no signs of chest wall recurrence.  Right breast shows no palpable masses.  Labs Appointment on 07/07/2017  Component Date Value Ref Range Status  . WBC 07/07/2017 5.1  4.0 - 10.5 K/uL Final  . RBC 07/07/2017 3.46* 3.87 - 5.11 MIL/uL Final  . Hemoglobin 07/07/2017 11.0* 12.0 - 15.0 g/dL Final  . HCT 07/07/2017 33.8* 36.0 - 46.0 % Final  . MCV 07/07/2017 97.7   78.0 - 100.0 fL Final  . MCH 07/07/2017 31.8  26.0 - 34.0 pg Final  . MCHC 07/07/2017 32.5  30.0 - 36.0 g/dL Final  . RDW 07/07/2017 14.4  11.5 - 15.5 % Final  . Platelets 07/07/2017 159  150 - 400 K/uL Final  . Neutrophils Relative % 07/07/2017 68  % Final  . Neutro Abs 07/07/2017 3.5  1.7 - 7.7 K/uL Final  . Lymphocytes Relative 07/07/2017 15  % Final  . Lymphs Abs 07/07/2017 0.7  0.7 - 4.0 K/uL Final  . Monocytes Relative 07/07/2017 12  % Final  . Monocytes Absolute 07/07/2017 0.6  0.1 - 1.0 K/uL Final  . Eosinophils Relative 07/07/2017 4  % Final  . Eosinophils Absolute 07/07/2017 0.2  0.0 - 0.7 K/uL Final  . Basophils Relative 07/07/2017 1  % Final  . Basophils Absolute 07/07/2017 0.0  0.0 - 0.1 K/uL Final   Performed at [facility], [address] [place], [place] [address]  . Sodium 07/07/2017 138  135 - 145 mmol/L Final  . Potassium 07/07/2017 4.4  3.5 - 5.1 mmol/L Final  . Chloride 07/07/2017 103  101 - 111 mmol/L Final  . CO2 07/07/2017 23  22 - 32 mmol/L Final  . Glucose, Bld 07/07/2017 204* 65 - 99 mg/dL Final  . BUN 07/07/2017 24* 6 - 20 mg/dL Final  . Creatinine, Ser 07/07/2017 1.23* 0.44 - 1.00 mg/dL Final  . Calcium 07/07/2017 10.0  8.9 - 10.3 mg/dL Final  . Total Protein 07/07/2017 6.8  6.5 - 8.1 g/dL Final  . Albumin 07/07/2017 3.9  3.5 - 5.0 g/dL Final  . AST 07/07/2017 25  15 - 41 U/L Final  . ALT 07/07/2017 22  14 - 54 U/L Final  . Alkaline Phosphatase 07/07/2017 61  38 - 126 U/L Final  . Total Bilirubin 07/07/2017 0.5  0.3 - 1.2 mg/dL Final  . GFR calc non Af Amer 07/07/2017 39* >60 mL/min Final  . GFR calc Af Amer 07/07/2017 46* >60 mL/min Final   Comment: (NOTE) The eGFR has been calculated using the CKD EPI equation. This calculation has not been validated in all clinical situations. eGFR's persistently <60 mL/min signify possible Chronic Kidney Disease.   Georgiann Hahn gap 07/07/2017 12  5 - 15 Final   Performed at [facility], [address],  [place], [place] [address]     Pathology Orders Placed This Encounter  Procedures  .  MM Digital Screening Unilat R    Standing Status:   Future    Standing Expiration Date:   07/07/2018    Order Specific Question:   Reason for Exam (SYMPTOM  OR DIAGNOSIS REQUIRED)    Answer:   left breast cancer    Order Specific Question:   Preferred imaging location?    Answer:   [facility]       Zoila Shutter MD

## 2017-07-07 NOTE — Patient Instructions (Addendum)
[place] at [facility]  Discharge Instructions:  You were seen by Dr Walden Field today. We schedule your Mammogram for October. You need to speak with your PCP or we can do a referral , we recommend that you see a Cardiologist to evaluated your heart.  _______________________________________________________________  Thank you for choosing [place] at [facility] to provide your oncology and hematology care.  To afford each patient quality time with our providers, please arrive at least 15 minutes before your scheduled appointment.  You need to re-schedule your appointment if you arrive 10 or more minutes late.  We strive to give you quality time with our providers, and arriving late affects you and other patients whose appointments are after yours.  Also, if you no show three or more times for appointments you may be dismissed from the clinic.  Again, thank you for choosing [place] at [place] hope is that these requests will allow you access to exceptional care and in a timely manner. _______________________________________________________________  If you have questions after your visit, please contact our office at [phone] between the hours of 8:30 a.m. and 5:00 p.m. Voicemails left after 4:30 p.m. will not be returned until the following business day. _______________________________________________________________  For prescription refill requests, have your pharmacy contact our office. _______________________________________________________________  Recommendations made by the consultant and any test results will be sent to your referring physician. _______________________________________________________________

## 2017-11-23 ENCOUNTER — Other Ambulatory Visit (HOSPITAL_COMMUNITY): Payer: Self-pay | Admitting: *Deleted

## 2017-11-23 DIAGNOSIS — C50912 Malignant neoplasm of unspecified site of left female breast: Secondary | ICD-10-CM

## 2017-11-23 MED ORDER — LETROZOLE 2.5 MG PO TABS: 2.5000 mg | ORAL_TABLET | Freq: Every day | ORAL | 1 refills | Status: DC

## 2018-01-05 ENCOUNTER — Inpatient Hospital Stay (HOSPITAL_COMMUNITY): Payer: Medicare Other | Attending: Hematology

## 2018-01-05 ENCOUNTER — Other Ambulatory Visit (HOSPITAL_COMMUNITY): Payer: Self-pay | Admitting: *Deleted

## 2018-01-05 ENCOUNTER — Ambulatory Visit (HOSPITAL_COMMUNITY)
Admission: RE | Admit: 2018-01-05 | Discharge: 2018-01-05 | Disposition: A | Discharge status: Home / Self Care | Payer: Medicare Other | Source: Ambulatory Visit | Attending: Internal Medicine | Admitting: Internal Medicine

## 2018-01-05 DIAGNOSIS — Z170 Estrogen receptor positive status [ER+]: Secondary | ICD-10-CM | POA: Insufficient documentation

## 2018-01-05 DIAGNOSIS — I129 Hypertensive chronic kidney disease with stage 1 through stage 4 chronic kidney disease, or unspecified chronic kidney disease: Secondary | ICD-10-CM | POA: Insufficient documentation

## 2018-01-05 DIAGNOSIS — E1122 Type 2 diabetes mellitus with diabetic chronic kidney disease: Secondary | ICD-10-CM | POA: Insufficient documentation

## 2018-01-05 DIAGNOSIS — C50912 Malignant neoplasm of unspecified site of left female breast: Secondary | ICD-10-CM | POA: Insufficient documentation

## 2018-01-05 DIAGNOSIS — Z9012 Acquired absence of left breast and nipple: Secondary | ICD-10-CM | POA: Insufficient documentation

## 2018-01-05 DIAGNOSIS — Z923 Personal history of irradiation: Secondary | ICD-10-CM | POA: Insufficient documentation

## 2018-01-05 DIAGNOSIS — Z1231 Encounter for screening mammogram for malignant neoplasm of breast: Secondary | ICD-10-CM | POA: Insufficient documentation

## 2018-01-05 DIAGNOSIS — N183 Chronic kidney disease, stage 3 unspecified: Secondary | ICD-10-CM

## 2018-01-05 DIAGNOSIS — Z87891 Personal history of nicotine dependence: Secondary | ICD-10-CM | POA: Insufficient documentation

## 2018-01-05 DIAGNOSIS — M8580 Other specified disorders of bone density and structure, unspecified site: Secondary | ICD-10-CM | POA: Insufficient documentation

## 2018-01-05 DIAGNOSIS — D631 Anemia in chronic kidney disease: Secondary | ICD-10-CM | POA: Insufficient documentation

## 2018-01-05 DIAGNOSIS — C773 Secondary and unspecified malignant neoplasm of axilla and upper limb lymph nodes: Secondary | ICD-10-CM | POA: Insufficient documentation

## 2018-01-05 DIAGNOSIS — M7989 Other specified soft tissue disorders: Secondary | ICD-10-CM | POA: Insufficient documentation

## 2018-01-05 DIAGNOSIS — N189 Chronic kidney disease, unspecified: Secondary | ICD-10-CM | POA: Insufficient documentation

## 2018-01-05 LAB — CBC WITH DIFFERENTIAL/PLATELET
ABS IMMATURE GRANULOCYTES: 0.03 10*3/uL (ref 0.00–0.07)
BASOS ABS: 0.1 10*3/uL (ref 0.0–0.1)
BASOS PCT: 1 %
Eosinophils Absolute: 0.2 10*3/uL (ref 0.0–0.5)
Eosinophils Relative: 4 %
HCT: 36.6 % (ref 36.0–46.0)
Hemoglobin: 11.6 g/dL — ABNORMAL LOW (ref 12.0–15.0)
Immature Granulocytes: 1 %
LYMPHS PCT: 17 %
Lymphs Abs: 1 10*3/uL (ref 0.7–4.0)
MCH: 32 pg (ref 26.0–34.0)
MCHC: 31.7 g/dL (ref 30.0–36.0)
MCV: 101.1 fL — ABNORMAL HIGH (ref 80.0–100.0)
Monocytes Absolute: 0.7 10*3/uL (ref 0.1–1.0)
Monocytes Relative: 12 %
NEUTROS ABS: 4.2 10*3/uL (ref 1.7–7.7)
NEUTROS PCT: 65 %
NRBC: 0 % (ref 0.0–0.2)
PLATELETS: 171 10*3/uL (ref 150–400)
RBC: 3.62 MIL/uL — AB (ref 3.87–5.11)
RDW: 13.8 % (ref 11.5–15.5)
WBC: 6.3 10*3/uL (ref 4.0–10.5)

## 2018-01-05 LAB — COMPREHENSIVE METABOLIC PANEL
ALK PHOS: 65 U/L (ref 38–126)
ALT: 29 U/L (ref 0–44)
ANION GAP: 6 (ref 5–15)
AST: 29 U/L (ref 15–41)
Albumin: 4.1 g/dL (ref 3.5–5.0)
BILIRUBIN TOTAL: 0.7 mg/dL (ref 0.3–1.2)
BUN: 25 mg/dL — ABNORMAL HIGH (ref 8–23)
CHLORIDE: 105 mmol/L (ref 98–111)
CO2: 28 mmol/L (ref 22–32)
Calcium: 9.9 mg/dL (ref 8.9–10.3)
Creatinine, Ser: 1.24 mg/dL — ABNORMAL HIGH (ref 0.44–1.00)
GFR calc non Af Amer: 39 mL/min — ABNORMAL LOW (ref >60)
GFR, EST AFRICAN AMERICAN: 45 mL/min — AB (ref >60)
Glucose, Bld: 138 mg/dL — ABNORMAL HIGH (ref 70–99)
POTASSIUM: 4.6 mmol/L (ref 3.5–5.1)
Sodium: 139 mmol/L (ref 135–145)
TOTAL PROTEIN: 7.2 g/dL (ref 6.5–8.1)

## 2018-01-05 NOTE — Progress Notes (Signed)
Patient wanted to have labs done prior to her appointment next week. CBC with diff and CMP orders placed per last office note.

## 2018-01-11 ENCOUNTER — Inpatient Hospital Stay (HOSPITAL_BASED_OUTPATIENT_CLINIC_OR_DEPARTMENT_OTHER): Payer: Medicare Other | Admitting: Internal Medicine

## 2018-01-11 ENCOUNTER — Encounter (HOSPITAL_COMMUNITY): Payer: Self-pay | Admitting: Internal Medicine

## 2018-01-11 ENCOUNTER — Other Ambulatory Visit: Payer: Self-pay

## 2018-01-11 VITALS — BP 155/62 | HR 52 | Temp 97.5°F | Resp 18 | Wt 206.5 lb

## 2018-01-11 DIAGNOSIS — C50912 Malignant neoplasm of unspecified site of left female breast: Secondary | ICD-10-CM

## 2018-01-11 DIAGNOSIS — Z87891 Personal history of nicotine dependence: Secondary | ICD-10-CM

## 2018-01-11 DIAGNOSIS — Z79811 Long term (current) use of aromatase inhibitors: Secondary | ICD-10-CM

## 2018-01-11 DIAGNOSIS — N189 Chronic kidney disease, unspecified: Secondary | ICD-10-CM

## 2018-01-11 DIAGNOSIS — Z170 Estrogen receptor positive status [ER+]: Secondary | ICD-10-CM

## 2018-01-11 DIAGNOSIS — M7989 Other specified soft tissue disorders: Secondary | ICD-10-CM | POA: Diagnosis not present

## 2018-01-11 DIAGNOSIS — I129 Hypertensive chronic kidney disease with stage 1 through stage 4 chronic kidney disease, or unspecified chronic kidney disease: Secondary | ICD-10-CM | POA: Diagnosis not present

## 2018-01-11 DIAGNOSIS — Z9012 Acquired absence of left breast and nipple: Secondary | ICD-10-CM

## 2018-01-11 DIAGNOSIS — C773 Secondary and unspecified malignant neoplasm of axilla and upper limb lymph nodes: Secondary | ICD-10-CM | POA: Diagnosis not present

## 2018-01-11 DIAGNOSIS — Z923 Personal history of irradiation: Secondary | ICD-10-CM

## 2018-01-11 DIAGNOSIS — D631 Anemia in chronic kidney disease: Secondary | ICD-10-CM | POA: Diagnosis not present

## 2018-01-11 DIAGNOSIS — M8580 Other specified disorders of bone density and structure, unspecified site: Secondary | ICD-10-CM | POA: Diagnosis not present

## 2018-01-11 DIAGNOSIS — E1122 Type 2 diabetes mellitus with diabetic chronic kidney disease: Secondary | ICD-10-CM

## 2018-01-11 NOTE — Progress Notes (Signed)
Diagnosis No diagnosis found.  Staging Cancer Staging Invasive ductal carcinoma of left breast ([place]) Staging form: Breast, AJCC 7th Edition - Clinical: Stage IIIA (T2, N2a, cM0) - Signed by Baird Cancer, PA-C on 10/13/2012 - Pathologic: No stage assigned - Unsigned   Assessment and Plan:   1.  Stage IIIA, invasive ductal carcinoma of left breast Previously followed by Dr. Talbert Cage.  Pt presented with a large mass, retraction of the nipple, 6 x 6 cm clinically with suspicious lymph nodes in the left axilla presentation. ER +100%, PR +100%, Ki-67 marker 66%, LV I was seen, but was felt to be grade 1. HER-2/neu was nonamplified. We treated her with neoadjuvant letrozole beginning 09/19/2010  for several months. She then underwent left modified radical mastectomy on 01/09/2011 but still had a 4.8 cm cancer. Clinically there was reduction in the size of the tumor. 4 of 4 lymph nodes were involved. She then completed radiation therapy in Feb 2013 postoperatively and was restarted on the letrozole 12/2010 .  BCI not able to be performed due to her nodal status.  Pt had 3 D right screening mammogram done 01/05/2018 that was reviewed and was negative.  She remains on Femara with an initial goal of 5 years that will extend to 10 years if tolerating therapy.  Pt will be seen for follow-up in 06/2018.    2. Anemia in the setting of chronic renal disease.  Labs done 01/05/2018 reviewed and showed WBC 6.3 HB 11.6 plts 171,000.  Chemistries WNL with K+ 4.6 Cr 1.24 and normal LFTs.  I have reassured her HB is adequate at 11.6 and stable to improved with labs done  07/07/2017.   3.  HTN.  Blood pressure 155/62.  Follow-up PCP.  4.  Ankle swelling.  She was given option of cardiology referral.  She reports she was seen by podiatry and they feel swelling is related to prior ankle injuries.    5.  Osteopenia.  Dexa done 01/04/2017 showed osteopenia.  Pt on calcium.  She will have repeat Dexa in 12/2018.    25  minutes spent with more than 50% spent in counseling and coordination of care.    Interval History:  Stage IIIA, invasive ductal carcinoma of left breast Previously followed by Dr. Talbert Cage.  Pt presented with a large mass, retraction of the nipple, 6 x 6 cm clinically with suspicious lymph nodes in the left axilla presentation. ER +100%, PR +100%, Ki-67 marker 66%, LV I was seen, but was felt to be grade 1. HER-2/neu was nonamplified. We treated her with neoadjuvant letrozole for several months. She then underwent left modified radical mastectomy on 01/09/2011 but still had a 4.8 cm cancer. Clinically there was reduction in the size of the tumor. 4 of 4 lymph nodes were involved. She then completed radiation therapy in Feb 2013 postoperatively and was restarted on the letrozole.  BCI not able to be performed due to her nodal status.  Current Status: Patient is seen today for follow-up.  She is here to go over mammogram and labs.     Problem List Patient Active Problem List   Diagnosis Date Noted  . Normocytic normochromic anemia [D64.9] 06/23/2016  . Chronic renal disease, stage 3, moderately decreased glomerular filtration rate (GFR) between 30-59 mL/min/1.73 square meter ([place]) [N18.3] 06/23/2016  . Aromatase inhibitor use [Z79.811] 06/23/2016  . Reactive depression [F32.9] 06/23/2016  . Lymphedema [I89.0] 05/08/2013  . Invasive ductal carcinoma of left breast ([place]) [C50.912] 10/03/2010  Past Medical History Past Medical History:  Diagnosis Date  . Anemia   . Anxiety   . Cancer ([place])   . Depression   . Diabetes mellitus type II   . Fracture of hand 09/29/12   fell and fractured right hand  . Hyperlipemia   . Hypertension   . Invasive ductal carcinoma of left breast ([place]) 10/03/2010  . Normocytic normochromic anemia 06/23/2016    Past Surgical History Past Surgical History:  Procedure Laterality Date  . APPENDECTOMY    . CATARACT EXTRACTION W/ INTRAOCULAR LENS IMPLANT  08/26/10    Right; [facility]  . CATARACT EXTRACTION W/PHACO  10/14/2010   Procedure: CATARACT EXTRACTION PHACO AND INTRAOCULAR LENS PLACEMENT (IOC);  Surgeon: Elta Guadeloupe T. Gers[facility] Crane;  Location: [facility];  Service: Ophthalmology;  Laterality: Left;  CDE:  8.79  . INCISIONAL BREAST BIOPSY  09/05/10   Left, [facility], [facility]  . MASTECTOMY    . YAG LASER APPLICATION Left 3/84/6659   Procedure: YAG LASER APPLICATION;  Surgeon: Rutherford Guys, MD;  Location: [facility];  Service: Ophthalmology;  Laterality: Left;    Family History Family History  Problem Relation Age of Onset  . Anesthesia problems Neg Hx      Social History  reports that she has quit smoking. She has never used smokeless tobacco. She reports that she does not drink alcohol or use drugs.  Medications  Current Outpatient Medications:  .  aspirin 81 MG tablet, Take 81 mg by mouth daily.  , Disp: , Rfl:  .  atorvastatin (LIPITOR) 20 MG tablet, Take 1 tablet by mouth daily., Disp: , Rfl:  .  Calcium Carbonate-Vitamin D (CALCIUM 600+D) 600-400 MG-UNIT per tablet, Take 1 tablet by mouth daily.  , Disp: , Rfl:  .  cholecalciferol (VITAMIN D) 1000 UNITS tablet, Take 1,000 Units by mouth daily.  , Disp: , Rfl:  .  escitalopram (LEXAPRO) 10 MG tablet, Take 1 tablet (10 mg total) by mouth daily., Disp: 90 tablet, Rfl: 1 .  famotidine (PEPCID) 20 MG tablet, Take 1 tablet (20 mg total) by mouth 2 (two) times daily., Disp: 30 tablet, Rfl: 0 .  gabapentin (NEURONTIN) 100 MG capsule, TK ONE C PO  QD, Disp: , Rfl: 2 .  HYDROcodone-acetaminophen (NORCO/VICODIN) 5-325 MG tablet, Take 1 tablet by mouth every 4 (four) hours as needed., Disp: 25 tablet, Rfl: 0 .  letrozole (FEMARA) 2.5 MG tablet, Take 1 tablet (2.5 mg total) by mouth daily., Disp: 90 tablet, Rfl: 1 .  lisinopril (PRINIVIL,ZESTRIL) 20 MG tablet, Take 1 tablet by mouth every evening., Disp: , Rfl:  .  lisinopril-hydrochlorothiazide (PRINZIDE,ZESTORETIC) 20-25 MG tablet, Take by mouth., Disp: , Rfl:  .   metFORMIN (GLUCOPHAGE) 500 MG tablet, Take 500 mg by mouth every evening. , Disp: , Rfl:  .  Multiple Vitamin (MULTIVITAMIN) tablet, Take 1 tablet by mouth daily.  , Disp: , Rfl:   Allergies Ciprofloxacin; Ferrous sulfate; and Sulfa drugs cross reactors  Review of Systems Review of Systems - Oncology ROS negative other than leg swelling   Physical Exam  Vitals Wt Readings from Last 3 Encounters:  01/11/18 206 lb 8 oz (93.7 kg)  07/07/17 208 lb 8 oz (94.6 kg)  12/28/16 205 lb 3.2 oz (93.1 kg)   Temp Readings from Last 3 Encounters:  01/11/18 (!) 97.5 F (36.4 C) (Oral)  07/07/17 (!) 97.4 F (36.3 C) (Oral)  12/28/16 97.6 F (36.4 C) (Oral)   BP Readings from Last 3 Encounters:  01/11/18 Marland Kitchen)  155/62  07/07/17 (!) 145/60  12/28/16 (!) 149/61   Pulse Readings from Last 3 Encounters:  01/11/18 (!) 52  07/07/17 (!) 51  12/28/16 (!) 57    Constitutional: Well-developed, well-nourished, and in no distress.   HENT: Head: Normocephalic and atraumatic.  Mouth/Throat: No oropharyngeal exudate. Mucosa moist. Eyes: Pupils are equal, round, and reactive to light. Conjunctivae are normal. No scleral icterus.  Neck: Normal range of motion. Neck supple. No JVD present.  Cardiovascular: Normal rate, regular rhythm and normal heart sounds.  Exam reveals no gallop and no friction rub.   No murmur heard. Pulmonary/Chest: Effort normal and breath sounds normal. No respiratory distress. No wheezes.No rales.  Abdominal: Soft. Bowel sounds are normal. No distension. There is no tenderness. There is no guarding.  Musculoskeletal: No edema or tenderness.  Lymphadenopathy: No cervical, axillary or supraclavicular adenopathy.  Neurological: Alert and oriented to person, place, and time. No cranial nerve deficit.  Skin: Skin is warm and dry. No rash noted. No erythema. No pallor.  Psychiatric: Affect and judgment normal.  Breast exam:  Chaperone present.  Left mastectomy site shows postsurgical  changes.  No palpable lesions noted.  Right breast shows no dominant masses.    Labs No visits with results within 3 Day(s) from this visit.  Latest known visit with results is:  Orders Only on 01/05/2018  Component Date Value Ref Range Status  . Sodium 01/05/2018 139  135 - 145 mmol/L Final  . Potassium 01/05/2018 4.6  3.5 - 5.1 mmol/L Final  . Chloride 01/05/2018 105  98 - 111 mmol/L Final  . CO2 01/05/2018 28  22 - 32 mmol/L Final  . Glucose, Bld 01/05/2018 138* 70 - 99 mg/dL Final  . BUN 01/05/2018 25* 8 - 23 mg/dL Final  . Creatinine, Ser 01/05/2018 1.24* 0.44 - 1.00 mg/dL Final  . Calcium 01/05/2018 9.9  8.9 - 10.3 mg/dL Final  . Total Protein 01/05/2018 7.2  6.5 - 8.1 g/dL Final  . Albumin 01/05/2018 4.1  3.5 - 5.0 g/dL Final  . AST 01/05/2018 29  15 - 41 U/L Final  . ALT 01/05/2018 29  0 - 44 U/L Final  . Alkaline Phosphatase 01/05/2018 65  38 - 126 U/L Final  . Total Bilirubin 01/05/2018 0.7  0.3 - 1.2 mg/dL Final  . GFR calc non Af Amer 01/05/2018 39* >60 mL/min Final  . GFR calc Af Amer 01/05/2018 45* >60 mL/min Final   Comment: (NOTE) The eGFR has been calculated using the CKD EPI equation. This calculation has not been validated in all clinical situations. eGFR's persistently <60 mL/min signify possible Chronic Kidney Disease.   Georgiann Hahn gap 01/05/2018 6  5 - 15 Final   Performed at [facility], [address], [place], [place] [address]  . WBC 01/05/2018 6.3  4.0 - 10.5 K/uL Final  . RBC 01/05/2018 3.62* 3.87 - 5.11 MIL/uL Final  . Hemoglobin 01/05/2018 11.6* 12.0 - 15.0 g/dL Final  . HCT 01/05/2018 36.6  36.0 - 46.0 % Final  . MCV 01/05/2018 101.1* 80.0 - 100.0 fL Final  . MCH 01/05/2018 32.0  26.0 - 34.0 pg Final  . MCHC 01/05/2018 31.7  30.0 - 36.0 g/dL Final  . RDW 01/05/2018 13.8  11.5 - 15.5 % Final  . Platelets 01/05/2018 171  150 - 400 K/uL Final  . nRBC 01/05/2018 0.0  0.0 - 0.2 % Final  . Neutrophils Relative % 01/05/2018 65  % Final  . Neutro Abs  01/05/2018  4.2  1.7 - 7.7 K/uL Final  . Lymphocytes Relative 01/05/2018 17  % Final  . Lymphs Abs 01/05/2018 1.0  0.7 - 4.0 K/uL Final  . Monocytes Relative 01/05/2018 12  % Final  . Monocytes Absolute 01/05/2018 0.7  0.1 - 1.0 K/uL Final  . Eosinophils Relative 01/05/2018 4  % Final  . Eosinophils Absolute 01/05/2018 0.2  0.0 - 0.5 K/uL Final  . Basophils Relative 01/05/2018 1  % Final  . Basophils Absolute 01/05/2018 0.1  0.0 - 0.1 K/uL Final  . Immature Granulocytes 01/05/2018 1  % Final  . Abs Immature Granulocytes 01/05/2018 0.03  0.00 - 0.07 K/uL Final   Performed at [facility], [address], [place], [place] [address]     Pathology No orders of the defined types were placed in this encounter.      Zoila Shutter MD

## 2018-05-22 ENCOUNTER — Other Ambulatory Visit (HOSPITAL_COMMUNITY): Payer: Self-pay | Admitting: Internal Medicine

## 2018-05-22 DIAGNOSIS — C50912 Malignant neoplasm of unspecified site of left female breast: Secondary | ICD-10-CM

## 2018-07-15 ENCOUNTER — Ambulatory Visit (HOSPITAL_COMMUNITY): Payer: Medicare Other | Admitting: Nurse Practitioner

## 2018-07-15 ENCOUNTER — Inpatient Hospital Stay (HOSPITAL_COMMUNITY): Payer: Medicare Other | Attending: Hematology | Admitting: Nurse Practitioner

## 2018-07-15 ENCOUNTER — Other Ambulatory Visit: Payer: Self-pay

## 2018-07-15 ENCOUNTER — Inpatient Hospital Stay (HOSPITAL_COMMUNITY): Payer: Medicare Other

## 2018-07-15 VITALS — BP 132/56 | HR 63 | Temp 97.7°F | Resp 18 | Wt 202.8 lb

## 2018-07-15 DIAGNOSIS — Z87891 Personal history of nicotine dependence: Secondary | ICD-10-CM | POA: Diagnosis not present

## 2018-07-15 DIAGNOSIS — C50912 Malignant neoplasm of unspecified site of left female breast: Secondary | ICD-10-CM | POA: Insufficient documentation

## 2018-07-15 DIAGNOSIS — D631 Anemia in chronic kidney disease: Secondary | ICD-10-CM | POA: Insufficient documentation

## 2018-07-15 DIAGNOSIS — Z170 Estrogen receptor positive status [ER+]: Secondary | ICD-10-CM | POA: Insufficient documentation

## 2018-07-15 DIAGNOSIS — Z7984 Long term (current) use of oral hypoglycemic drugs: Secondary | ICD-10-CM | POA: Diagnosis not present

## 2018-07-15 DIAGNOSIS — Z79811 Long term (current) use of aromatase inhibitors: Secondary | ICD-10-CM | POA: Diagnosis not present

## 2018-07-15 DIAGNOSIS — E785 Hyperlipidemia, unspecified: Secondary | ICD-10-CM | POA: Diagnosis not present

## 2018-07-15 DIAGNOSIS — M8580 Other specified disorders of bone density and structure, unspecified site: Secondary | ICD-10-CM | POA: Insufficient documentation

## 2018-07-15 DIAGNOSIS — Z923 Personal history of irradiation: Secondary | ICD-10-CM | POA: Insufficient documentation

## 2018-07-15 DIAGNOSIS — E1122 Type 2 diabetes mellitus with diabetic chronic kidney disease: Secondary | ICD-10-CM

## 2018-07-15 DIAGNOSIS — Z882 Allergy status to sulfonamides status: Secondary | ICD-10-CM | POA: Diagnosis not present

## 2018-07-15 DIAGNOSIS — Z9012 Acquired absence of left breast and nipple: Secondary | ICD-10-CM

## 2018-07-15 DIAGNOSIS — Z79899 Other long term (current) drug therapy: Secondary | ICD-10-CM | POA: Diagnosis not present

## 2018-07-15 DIAGNOSIS — N183 Chronic kidney disease, stage 3 (moderate): Secondary | ICD-10-CM

## 2018-07-15 DIAGNOSIS — M810 Age-related osteoporosis without current pathological fracture: Principal | ICD-10-CM

## 2018-07-15 LAB — COMPREHENSIVE METABOLIC PANEL
ALT: 22 U/L (ref 0–44)
AST: 22 U/L (ref 15–41)
Albumin: 4.3 g/dL (ref 3.5–5.0)
Alkaline Phosphatase: 56 U/L (ref 38–126)
Anion gap: 10 (ref 5–15)
BUN: 30 mg/dL — ABNORMAL HIGH (ref 8–23)
CO2: 23 mmol/L (ref 22–32)
Calcium: 10.1 mg/dL (ref 8.9–10.3)
Chloride: 106 mmol/L (ref 98–111)
Creatinine, Ser: 1.27 mg/dL — ABNORMAL HIGH (ref 0.44–1.00)
GFR calc Af Amer: 45 mL/min — ABNORMAL LOW (ref >60)
GFR calc non Af Amer: 39 mL/min — ABNORMAL LOW (ref >60)
Glucose, Bld: 108 mg/dL — ABNORMAL HIGH (ref 70–99)
Potassium: 4.5 mmol/L (ref 3.5–5.1)
Sodium: 139 mmol/L (ref 135–145)
Total Bilirubin: 0.2 mg/dL — ABNORMAL LOW (ref 0.3–1.2)
Total Protein: 7.1 g/dL (ref 6.5–8.1)

## 2018-07-15 LAB — CBC WITH DIFFERENTIAL/PLATELET
Abs Immature Granulocytes: 0.03 10*3/uL (ref 0.00–0.07)
Basophils Absolute: 0.1 10*3/uL (ref 0.0–0.1)
Basophils Relative: 1 %
Eosinophils Absolute: 0.2 10*3/uL (ref 0.0–0.5)
Eosinophils Relative: 3 %
HCT: 35.8 % — ABNORMAL LOW (ref 36.0–46.0)
Hemoglobin: 11.9 g/dL — ABNORMAL LOW (ref 12.0–15.0)
Immature Granulocytes: 1 %
Lymphocytes Relative: 17 %
Lymphs Abs: 1.2 10*3/uL (ref 0.7–4.0)
MCH: 33.2 pg (ref 26.0–34.0)
MCHC: 33.2 g/dL (ref 30.0–36.0)
MCV: 100 fL (ref 80.0–100.0)
Monocytes Absolute: 0.7 10*3/uL (ref 0.1–1.0)
Monocytes Relative: 11 %
Neutro Abs: 4.5 10*3/uL (ref 1.7–7.7)
Neutrophils Relative %: 67 %
Platelets: 173 10*3/uL (ref 150–400)
RBC: 3.58 MIL/uL — ABNORMAL LOW (ref 3.87–5.11)
RDW: 13.6 % (ref 11.5–15.5)
WBC: 6.6 10*3/uL (ref 4.0–10.5)
nRBC: 0 % (ref 0.0–0.2)

## 2018-07-15 LAB — IRON AND TIBC
Iron: 84 ug/dL (ref 28–170)
Saturation Ratios: 24 % (ref 10.4–31.8)
TIBC: 343 ug/dL (ref 250–450)
UIBC: 259 ug/dL

## 2018-07-15 LAB — LACTATE DEHYDROGENASE: LDH: 144 U/L (ref 98–192)

## 2018-07-15 LAB — FERRITIN: Ferritin: 135 ng/mL (ref 11–307)

## 2018-07-15 LAB — FOLATE: Folate: 44.6 ng/mL (ref >5.9)

## 2018-07-15 LAB — VITAMIN B12: Vitamin B-12: 1205 pg/mL — ABNORMAL HIGH (ref 180–914)

## 2018-07-15 NOTE — Patient Instructions (Signed)
[place] at [facility] Discharge Instructions  Follow up in 6 months with labs and mammogram and dexa scan   Thank you for choosing [place] at [facility] to provide your oncology and hematology care.  To afford each patient quality time with our provider, please arrive at least 15 minutes before your scheduled appointment time.   If you have a lab appointment with the [facility] please come in thru the  Main Entrance and check in at the main information desk  You need to re-schedule your appointment should you arrive 10 or more minutes late.  We strive to give you quality time with our providers, and arriving late affects you and other patients whose appointments are after yours.  Also, if you no show three or more times for appointments you may be dismissed from the clinic at the providers discretion.     Again, thank you for choosing [facility].  Our hope is that these requests will decrease the amount of time that you wait before being seen by our physicians.       _____________________________________________________________  Should you have questions after your visit to [facility], please contact our office at [phone] between the hours of 8:00 a.m. and 4:30 p.m.  Voicemails left after 4:00 p.m. will not be returned until the following business day.  For prescription refill requests, have your pharmacy contact our office and allow 72 hours.    Cancer Center Support Programs:   > Cancer Support Group  2nd Tuesday of the month 1pm-2pm, Journey Room

## 2018-07-15 NOTE — Assessment & Plan Note (Addendum)
1.  Stage IIIa, invasive ductal carcinoma the left breast: - Patient was diagnosed at the beginning of 2012 with invasive ductal carcinoma. -She presented with a large mass, retraction of the nipple, 6 x 6 cm clinically with suspicious lymph nodes in the right axillary region. - She was ER/PR 100% positive, HER-2 negative, Ki-67 marker 66%. -She was treated with neoadjuvant letrozole which started 09/19/2010 for several months then continued. -She then went a left modified radical mastectomy on 01/09/2011 4 of 4 lymph nodes were positive. -She completed radiation in 2013 postoperatively  -BCI not able to be performed due to her nodal status. -Her last mammogram was done on 01/05/2018 which was a RADS category 1 negative. - She is now on Femara which she will take for 10 years of tolerating therapy well. She will have completed 10 years in 09/2020. - She will follow-up in 6 months after her mammogram with labs.   2.  Anemia: - Due to CKD. - We will repeat labs today on 07/15/2018. -I will call her with the results.  3.  Osteopenia: -DEXA scan was done on 01/04/2017 which showed osteopenia with a T score of -1.6. - Patient is taking daily calcium. -She will repeat a DEXA scan 12/2018.  We will set that up for next visit.

## 2018-07-15 NOTE — Progress Notes (Signed)
Pt is currently taking letrozole chemo pill. Pt has no complaints of side effects from taking this medication.

## 2018-07-15 NOTE — Progress Notes (Signed)
[facility], [place] [address]   CLINIC:  Medical Oncology/Hematology  PCP:  Donalynn Furlong, MD [place] [id number] [phone]   REASON FOR VISIT: Follow-up for invasive ductal carcinoma the left breast  CURRENT THERAPY: Letrozole   CANCER STAGING: Cancer Staging Invasive ductal carcinoma of left breast ([place]) Staging form: Breast, AJCC 7th Edition - Clinical: Stage IIIA (T2, N2a, cM0) - Signed by Baird Cancer, PA-C on 10/13/2012 - Pathologic: No stage assigned - Unsigned    INTERVAL HISTORY:  [name] 85 y.o. female returns for routine follow-up left breast cancer.  She reports she has been doing well since her last visit.  She reports no side effects from the Femara she is taking daily.  She has been taking it for 8 years. Denies any nausea, vomiting, or diarrhea. Denies any new pains. Had not noticed any recent bleeding such as epistaxis, hematuria or hematochezia. Denies recent chest pain on exertion, shortness of breath on minimal exertion, pre-syncopal episodes, or palpitations. Denies any numbness or tingling in hands or feet. Denies any recent fevers, infections, or recent hospitalizations. Patient reports appetite at 100% and energy level at 50%.  She is eating well and maintaining her weight at this time.  She reports she is not very active at home she lays around and watches TV most of the day.  She was educated on the importance of exercise and the decreased risk of recurrence of breast cancer.    REVIEW OF SYSTEMS:  Review of Systems  All other systems reviewed and are negative.    PAST MEDICAL/SURGICAL HISTORY:  Past Medical History:  Diagnosis Date  . Anemia   . Anxiety   . Cancer ([place])   . Depression   . Diabetes mellitus type II   . Fracture of hand 09/29/12   fell and fractured right hand  . Hyperlipemia   . Hypertension   . Invasive ductal carcinoma of left breast ([place])  10/03/2010  . Normocytic normochromic anemia 06/23/2016   Past Surgical History:  Procedure Laterality Date  . APPENDECTOMY    . CATARACT EXTRACTION W/ INTRAOCULAR LENS IMPLANT  08/26/10   Right; [facility]  . CATARACT EXTRACTION W/PHACO  10/14/2010   Procedure: CATARACT EXTRACTION PHACO AND INTRAOCULAR LENS PLACEMENT (IOC);  Surgeon: Elta Guadeloupe T. Gershon Crane;  Location: [facility];  Service: Ophthalmology;  Laterality: Left;  CDE:  8.79  . INCISIONAL BREAST BIOPSY  09/05/10   Left, [facility], [facility]  . MASTECTOMY    . YAG LASER APPLICATION Left 8/37/2902   Procedure: YAG LASER APPLICATION;  Surgeon: Rutherford Guys, MD;  Location: [facility];  Service: Ophthalmology;  Laterality: Left;     SOCIAL HISTORY:  Social History   Socioeconomic History  . Marital status: Married    Spouse name: Not on file  . Number of children: Not on file  . Years of education: Not on file  . Highest education level: Not on file  Occupational History  . Not on file  Social Needs  . Financial resource strain: Not on file  . Food insecurity:    Worry: Not on file    Inability: Not on file  . Transportation needs:    Medical: Not on file    Non-medical: Not on file  Tobacco Use  . Smoking status: Former Research scientist (life sciences)  . Smokeless tobacco: Never Used  Substance and Sexual Activity  . Alcohol use: No  . Drug use: No  .  Sexual activity: Not Currently  Lifestyle  . Physical activity:    Days per week: Not on file    Minutes per session: Not on file  . Stress: Not on file  Relationships  . Social connections:    Talks on phone: Not on file    Gets together: Not on file    Attends religious service: Not on file    Active member of club or organization: Not on file    Attends meetings of clubs or organizations: Not on file    Relationship status: Not on file  . Intimate partner violence:    Fear of current or ex partner: Not on file    Emotionally abused: Not on file    Physically abused: Not on file    Forced sexual  activity: Not on file  Other Topics Concern  . Not on file  Social History Narrative  . Not on file    FAMILY HISTORY:  Family History  Problem Relation Age of Onset  . Anesthesia problems Neg Hx     CURRENT MEDICATIONS:  Outpatient Encounter Medications as of 07/15/2018  Medication Sig  . atorvastatin (LIPITOR) 20 MG tablet Take 1 tablet by mouth daily.  . Calcium Carbonate-Vitamin D (CALCIUM 600+D) 600-400 MG-UNIT per tablet Take 1 tablet by mouth daily.    . cholecalciferol (VITAMIN D) 1000 UNITS tablet Take 1,000 Units by mouth daily.    Marland Kitchen escitalopram (LEXAPRO) 10 MG tablet Take 1 tablet (10 mg total) by mouth daily.  Marland Kitchen gabapentin (NEURONTIN) 100 MG capsule Take 100 mg by mouth 3 (three) times daily.   Marland Kitchen letrozole (FEMARA) 2.5 MG tablet TAKE 1 TABLET DAILY  . lisinopril (PRINIVIL,ZESTRIL) 20 MG tablet Take 1 tablet by mouth every evening.  . metFORMIN (GLUCOPHAGE) 500 MG tablet Take 500 mg by mouth every evening.   . Multiple Vitamin (MULTIVITAMIN) tablet Take 1 tablet by mouth daily.    . [DISCONTINUED] aspirin 81 MG tablet Take 81 mg by mouth daily.    . [DISCONTINUED] famotidine (PEPCID) 20 MG tablet Take 1 tablet (20 mg total) by mouth 2 (two) times daily.  Marland Kitchen HYDROcodone-acetaminophen (NORCO/VICODIN) 5-325 MG tablet Take 1 tablet by mouth every 4 (four) hours as needed. (Patient not taking: Reported on 07/15/2018)  . [DISCONTINUED] lisinopril-hydrochlorothiazide (PRINZIDE,ZESTORETIC) 20-25 MG tablet Take by mouth.   No facility-administered encounter medications on file as of 07/15/2018.     ALLERGIES:  Allergies  Allergen Reactions  . Ciprofloxacin Nausea Only  . Ferrous Sulfate Nausea And Vomiting  . Sulfa Drugs Cross Reactors Nausea And Vomiting     PHYSICAL EXAM:  ECOG Performance status: 1  Vitals:   07/15/18 1205  BP: (!) 132/56  Pulse: 63  Resp: 18  Temp: 97.7 F (36.5 C)   Filed Weights   07/15/18 1205  Weight: 202 lb 12.8 oz (92 kg)    Physical  Exam Constitutional:      Appearance: Normal appearance. She is normal weight.  Cardiovascular:     Rate and Rhythm: Normal rate and regular rhythm.     Heart sounds: Normal heart sounds.  Pulmonary:     Effort: Pulmonary effort is normal.     Breath sounds: Normal breath sounds.  Abdominal:     General: Bowel sounds are normal.     Palpations: Abdomen is soft.  Musculoskeletal: Normal range of motion.  Skin:    General: Skin is warm and dry.  Neurological:     Mental Status: She is alert  and oriented to person, place, and time. Mental status is at baseline.  Psychiatric:        Mood and Affect: Mood normal.        Behavior: Behavior normal.        Thought Content: Thought content normal.        Judgment: Judgment normal.   Breast: LEFT: Mastectomy site well-healed.  No adenopathy.  No new masses.  No axillary adenopathy RIGHT: No palpable masses, no skin changes or nipple discharge, no adenopathy.   LABORATORY DATA:  I have reviewed the labs as listed.  CBC    Component Value Date/Time   WBC 6.3 01/05/2018 1123   RBC 3.62 (L) 01/05/2018 1123   HGB 11.6 (L) 01/05/2018 1123   HCT 36.6 01/05/2018 1123   PLT 171 01/05/2018 1123   MCV 101.1 (H) 01/05/2018 1123   MCH 32.0 01/05/2018 1123   MCHC 31.7 01/05/2018 1123   RDW 13.8 01/05/2018 1123   LYMPHSABS 1.0 01/05/2018 1123   MONOABS 0.7 01/05/2018 1123   EOSABS 0.2 01/05/2018 1123   BASOSABS 0.1 01/05/2018 1123   CMP Latest Ref Rng & Units 01/05/2018 07/07/2017 12/28/2016  Glucose 70 - 99 mg/dL 138(H) 204(H) 120(H)  BUN 8 - 23 mg/dL 25(H) 24(H) 32(H)  Creatinine 0.44 - 1.00 mg/dL 1.24(H) 1.23(H) 1.25(H)  Sodium 135 - 145 mmol/L 139 138 138  Potassium 3.5 - 5.1 mmol/L 4.6 4.4 4.6  Chloride 98 - 111 mmol/L 105 103 105  CO2 22 - 32 mmol/L _0 Calcium 8.9 - 10.3 mg/dL 9.9 10.0 9.5  Total Protein 6.5 - 8.1 g/dL 7.2 6.8 6.8  Total Bilirubin 0.3 - 1.2 mg/dL 0.7 0.5 0.6  Alkaline Phos 38 - 126 U/L 65 61 75  AST 15 -  41 U/L _1 ALT 0 - 44 U/L 29 22 33    I personally performed a face-to-face visit.  All questions were answered to patient's stated satisfaction. Encouraged patient to call with any new concerns or questions before his next visit to the cancer center and we can certain see him sooner, if needed.     ASSESSMENT & PLAN:   Invasive ductal carcinoma of left breast 1.  Stage IIIa, invasive ductal carcinoma the left breast: - Patient was diagnosed at the beginning of 2012 with invasive ductal carcinoma. -She presented with a large mass, retraction of the nipple, 6 x 6 cm clinically with suspicious lymph nodes in the right axillary region. - She was ER/PR 100% positive, HER-2 negative, Ki-67 marker 66%. -She was treated with neoadjuvant letrozole which started 09/19/2010 for several months then continued. -She then went a left modified radical mastectomy on 01/09/2011 4 of 4 lymph nodes were positive. -She completed radiation in 2013 postoperatively  -BCI not able to be performed due to her nodal status. -Her last mammogram was done on 01/05/2018 which was a RADS category 1 negative. - She is now on Femara which she will take for 10 years of tolerating therapy well. She will have completed 10 years in 09/2020. - She will follow-up in 6 months after her mammogram with labs.   2.  Anemia: - Due to CKD. - We will repeat labs today on 07/15/2018. -I will call her with the results.  3.  Osteopenia: -DEXA scan was done on 01/04/2017 which showed osteopenia with a T score of -1.6. - Patient is taking daily calcium. -She will repeat a DEXA scan 12/2018.  We will set that  up for next visit.      Orders placed this encounter:  Orders Placed This Encounter  Procedures  . MM 3D SCREEN BREAST UNI RIGHT  . DG Bone Density  . Lactate dehydrogenase  . CBC with Differential/Platelet  . Comprehensive metabolic panel  . Ferritin  . Iron and TIBC  . Vitamin B12  . VITAMIN D 25 Hydroxy (Vit-D  Deficiency, Fractures)  . Folate  . Lactate dehydrogenase  . CBC with Differential/Platelet  . Comprehensive metabolic panel  . Ferritin  . Iron and TIBC  . Vitamin B12  . VITAMIN D 25 Hydroxy (Vit-D Deficiency, Fractures)  . Folate      Francene Finders, FNP-C [facility] [phone]

## 2018-07-16 LAB — VITAMIN D 25 HYDROXY (VIT D DEFICIENCY, FRACTURES): Vit D, 25-Hydroxy: 57 ng/mL (ref 30.0–100.0)

## 2018-08-20 ENCOUNTER — Other Ambulatory Visit (HOSPITAL_COMMUNITY): Payer: Self-pay | Admitting: Internal Medicine

## 2018-08-20 DIAGNOSIS — C50912 Malignant neoplasm of unspecified site of left female breast: Secondary | ICD-10-CM

## 2018-08-22 NOTE — Telephone Encounter (Signed)
Former pt. of Dr. Walden Field.  Needs new order from [facility] for letrozole refill.

## 2018-08-23 ENCOUNTER — Other Ambulatory Visit (HOSPITAL_COMMUNITY): Payer: Self-pay | Admitting: Nurse Practitioner

## 2018-08-23 DIAGNOSIS — C50912 Malignant neoplasm of unspecified site of left female breast: Secondary | ICD-10-CM

## 2018-08-23 MED ORDER — LETROZOLE 2.5 MG PO TABS: 2.5000 mg | ORAL_TABLET | Freq: Every day | ORAL | 0 refills | Status: DC

## 2018-08-24 ENCOUNTER — Telehealth (HOSPITAL_COMMUNITY): Payer: Self-pay | Admitting: *Deleted

## 2018-08-24 NOTE — Telephone Encounter (Signed)
[facility] called and left message about this patient's medication Letrozole 2.5 mg. Spoke with pharmacist Vickki Hearing to clarify a change in providers and verify the prescription  was sent in on 08/23/18 by new provider. Mr. Stacy [name] verbalized understanding.

## 2018-11-21 ENCOUNTER — Other Ambulatory Visit (HOSPITAL_COMMUNITY): Payer: Self-pay | Admitting: Nurse Practitioner

## 2018-11-21 DIAGNOSIS — C50912 Malignant neoplasm of unspecified site of left female breast: Secondary | ICD-10-CM

## 2019-01-09 ENCOUNTER — Other Ambulatory Visit: Payer: Self-pay

## 2019-01-09 ENCOUNTER — Ambulatory Visit (HOSPITAL_COMMUNITY)
Admission: RE | Admit: 2019-01-09 | Discharge: 2019-01-09 | Disposition: A | Discharge status: Home / Self Care | Payer: Medicare Other | Source: Ambulatory Visit | Attending: Nurse Practitioner | Admitting: Nurse Practitioner

## 2019-01-09 ENCOUNTER — Inpatient Hospital Stay (HOSPITAL_COMMUNITY): Payer: Medicare Other | Attending: Hematology

## 2019-01-09 DIAGNOSIS — C50912 Malignant neoplasm of unspecified site of left female breast: Secondary | ICD-10-CM

## 2019-01-09 DIAGNOSIS — Z1231 Encounter for screening mammogram for malignant neoplasm of breast: Secondary | ICD-10-CM | POA: Insufficient documentation

## 2019-01-09 DIAGNOSIS — Z853 Personal history of malignant neoplasm of breast: Secondary | ICD-10-CM | POA: Diagnosis not present

## 2019-01-09 DIAGNOSIS — M85851 Other specified disorders of bone density and structure, right thigh: Secondary | ICD-10-CM | POA: Diagnosis not present

## 2019-01-09 DIAGNOSIS — N189 Chronic kidney disease, unspecified: Secondary | ICD-10-CM | POA: Diagnosis present

## 2019-01-09 DIAGNOSIS — M8580 Other specified disorders of bone density and structure, unspecified site: Secondary | ICD-10-CM | POA: Insufficient documentation

## 2019-01-09 DIAGNOSIS — D631 Anemia in chronic kidney disease: Secondary | ICD-10-CM | POA: Insufficient documentation

## 2019-01-09 DIAGNOSIS — Z780 Asymptomatic menopausal state: Secondary | ICD-10-CM

## 2019-01-09 LAB — CBC WITH DIFFERENTIAL/PLATELET
Abs Immature Granulocytes: 0.03 10*3/uL (ref 0.00–0.07)
Basophils Absolute: 0.1 10*3/uL (ref 0.0–0.1)
Basophils Relative: 1 %
Eosinophils Absolute: 0.2 10*3/uL (ref 0.0–0.5)
Eosinophils Relative: 4 %
HCT: 37.1 % (ref 36.0–46.0)
Hemoglobin: 12 g/dL (ref 12.0–15.0)
Immature Granulocytes: 1 %
Lymphocytes Relative: 18 %
Lymphs Abs: 1 10*3/uL (ref 0.7–4.0)
MCH: 32.8 pg (ref 26.0–34.0)
MCHC: 32.3 g/dL (ref 30.0–36.0)
MCV: 101.4 fL — ABNORMAL HIGH (ref 80.0–100.0)
Monocytes Absolute: 0.6 10*3/uL (ref 0.1–1.0)
Monocytes Relative: 11 %
Neutro Abs: 3.6 10*3/uL (ref 1.7–7.7)
Neutrophils Relative %: 65 %
Platelets: 168 10*3/uL (ref 150–400)
RBC: 3.66 MIL/uL — ABNORMAL LOW (ref 3.87–5.11)
RDW: 13.2 % (ref 11.5–15.5)
WBC: 5.5 10*3/uL (ref 4.0–10.5)
nRBC: 0 % (ref 0.0–0.2)

## 2019-01-09 LAB — COMPREHENSIVE METABOLIC PANEL
ALT: 23 U/L (ref 0–44)
AST: 23 U/L (ref 15–41)
Albumin: 4.2 g/dL (ref 3.5–5.0)
Alkaline Phosphatase: 58 U/L (ref 38–126)
Anion gap: 13 (ref 5–15)
BUN: 25 mg/dL — ABNORMAL HIGH (ref 8–23)
CO2: 26 mmol/L (ref 22–32)
Calcium: 10.4 mg/dL — ABNORMAL HIGH (ref 8.9–10.3)
Chloride: 103 mmol/L (ref 98–111)
Creatinine, Ser: 1.19 mg/dL — ABNORMAL HIGH (ref 0.44–1.00)
GFR calc Af Amer: 48 mL/min — ABNORMAL LOW (ref >60)
GFR calc non Af Amer: 42 mL/min — ABNORMAL LOW (ref >60)
Glucose, Bld: 166 mg/dL — ABNORMAL HIGH (ref 70–99)
Potassium: 4.4 mmol/L (ref 3.5–5.1)
Sodium: 142 mmol/L (ref 135–145)
Total Bilirubin: 0.7 mg/dL (ref 0.3–1.2)
Total Protein: 7.2 g/dL (ref 6.5–8.1)

## 2019-01-09 LAB — FOLATE: Folate: 72.8 ng/mL (ref >5.9)

## 2019-01-09 LAB — LACTATE DEHYDROGENASE: LDH: 141 U/L (ref 98–192)

## 2019-01-09 LAB — FERRITIN: Ferritin: 140 ng/mL (ref 11–307)

## 2019-01-09 LAB — VITAMIN B12: Vitamin B-12: 788 pg/mL (ref 180–914)

## 2019-01-09 LAB — IRON AND TIBC
Iron: 71 ug/dL (ref 28–170)
Saturation Ratios: 20 % (ref 10.4–31.8)
TIBC: 355 ug/dL (ref 250–450)
UIBC: 284 ug/dL

## 2019-01-09 LAB — VITAMIN D 25 HYDROXY (VIT D DEFICIENCY, FRACTURES): Vit D, 25-Hydroxy: 44.57 ng/mL (ref 30–100)

## 2019-01-11 ENCOUNTER — Ambulatory Visit (HOSPITAL_COMMUNITY): Payer: Medicare Other | Admitting: Nurse Practitioner

## 2019-01-16 ENCOUNTER — Inpatient Hospital Stay (HOSPITAL_COMMUNITY): Payer: Medicare Other | Attending: Nurse Practitioner | Admitting: Nurse Practitioner

## 2019-01-16 ENCOUNTER — Other Ambulatory Visit: Payer: Self-pay

## 2019-01-16 DIAGNOSIS — Z853 Personal history of malignant neoplasm of breast: Secondary | ICD-10-CM | POA: Insufficient documentation

## 2019-01-16 DIAGNOSIS — Z923 Personal history of irradiation: Secondary | ICD-10-CM | POA: Insufficient documentation

## 2019-01-16 DIAGNOSIS — C50912 Malignant neoplasm of unspecified site of left female breast: Secondary | ICD-10-CM

## 2019-01-16 DIAGNOSIS — M8580 Other specified disorders of bone density and structure, unspecified site: Secondary | ICD-10-CM | POA: Diagnosis not present

## 2019-01-16 DIAGNOSIS — I129 Hypertensive chronic kidney disease with stage 1 through stage 4 chronic kidney disease, or unspecified chronic kidney disease: Secondary | ICD-10-CM | POA: Insufficient documentation

## 2019-01-16 DIAGNOSIS — N189 Chronic kidney disease, unspecified: Secondary | ICD-10-CM | POA: Diagnosis not present

## 2019-01-16 DIAGNOSIS — D631 Anemia in chronic kidney disease: Secondary | ICD-10-CM | POA: Diagnosis not present

## 2019-01-16 MED ORDER — LETROZOLE 2.5 MG PO TABS: 2.5000 mg | ORAL_TABLET | Freq: Every day | ORAL | 3 refills | Status: DC

## 2019-01-16 NOTE — Patient Instructions (Signed)
[facility] at [facility] Discharge Instructions  Follow up in 6 months with labs    Thank you for choosing [facility] at [facility] to provide your oncology and hematology care.  To afford each patient quality time with our provider, please arrive at least 15 minutes before your scheduled appointment time.   If you have a lab appointment with the Cancer Center please come in thru the Main Entrance and check in at the main information desk.  You need to re-schedule your appointment should you arrive 10 or more minutes late.  We strive to give you quality time with our providers, and arriving late affects you and other patients whose appointments are after yours.  Also, if you no show three or more times for appointments you may be dismissed from the clinic at the providers discretion.     Again, thank you for choosing [facility].  Our hope is that these requests will decrease the amount of time that you wait before being seen by our physicians.       _____________________________________________________________  Should you have questions after your visit to [facility], please contact our office at [phone] between the hours of 8:00 a.m. and 4:30 p.m.  Voicemails left after 4:00 p.m. will not be returned until the following business day.  For prescription refill requests, have your pharmacy contact our office and allow 72 hours.    Due to Covid, you will need to wear a mask upon entering the hospital. If you do not have a mask, a mask will be given to you at the Main Entrance upon arrival. For doctor visits, patients may have 1 support person with them. For treatment visits, patients can not have anyone with them due to social distancing guidelines and our immunocompromised population.      

## 2019-01-16 NOTE — Progress Notes (Signed)
Stacy [name], [place] [address]   CLINIC:  Medical Oncology/Hematology  PCP:  Donalynn Furlong, MD [place] [address] [phone]   REASON FOR VISIT: Follow-up for breast cancer  CURRENT THERAPY: Letrozole   CANCER STAGING: Cancer Staging Invasive ductal carcinoma of left breast ([place]) Staging form: Breast, AJCC 7th Edition - Clinical: Stage IIIA (T2, N2a, cM0) - Signed by Baird Cancer, PA-C on 10/13/2012 - Pathologic: No stage assigned - Unsigned    INTERVAL HISTORY:  Stacy [name] 85 y.o. female returns for routine follow-up for breast cancer.  Patient reports she has been doing well since her last visit.  She has no complaints at this time. Denies any nausea, vomiting, or diarrhea. Denies any new pains. Had not noticed any recent bleeding such as epistaxis, hematuria or hematochezia. Denies recent chest pain on exertion, shortness of breath on minimal exertion, pre-syncopal episodes, or palpitations. Denies any numbness or tingling in hands or feet. Denies any recent fevers, infections, or recent hospitalizations. Patient reports appetite at 100% and energy level at 100%.  She is eating well maintaining her weight at this time.    REVIEW OF SYSTEMS:  Review of Systems  All other systems reviewed and are negative.    PAST MEDICAL/SURGICAL HISTORY:  Past Medical History:  Diagnosis Date  . Anemia   . Anxiety   . Cancer ([place])   . Depression   . Diabetes mellitus type II   . Fracture of hand 09/29/12   fell and fractured right hand  . Hyperlipemia   . Hypertension   . Invasive ductal carcinoma of left breast ([place]) 10/03/2010  . Normocytic normochromic anemia 06/23/2016   Past Surgical History:  Procedure Laterality Date  . APPENDECTOMY    . CATARACT EXTRACTION W/ INTRAOCULAR LENS IMPLANT  08/26/10   Right; [facility]  . CATARACT EXTRACTION W/PHACO  10/14/2010   Procedure: CATARACT EXTRACTION PHACO  AND INTRAOCULAR LENS PLACEMENT (IOC);  Surgeon: Elta Guadeloupe T. Gershon Crane;  Location: [facility];  Service: Ophthalmology;  Laterality: Left;  CDE:  8.79  . INCISIONAL BREAST BIOPSY  09/05/10   Left, [facility], [facility]  . MASTECTOMY    . YAG LASER APPLICATION Left 0/30/0923   Procedure: YAG LASER APPLICATION;  Surgeon: Rutherford Guys, MD;  Location: [facility];  Service: Ophthalmology;  Laterality: Left;     SOCIAL HISTORY:  Social History   Socioeconomic History  . Marital status: Married    Spouse name: Not on file  . Number of children: Not on file  . Years of education: Not on file  . Highest education level: Not on file  Occupational History  . Not on file  Social Needs  . Financial resource strain: Not on file  . Food insecurity    Worry: Not on file    Inability: Not on file  . Transportation needs    Medical: Not on file    Non-medical: Not on file  Tobacco Use  . Smoking status: Former Research scientist (life sciences)  . Smokeless tobacco: Never Used  Substance and Sexual Activity  . Alcohol use: No  . Drug use: No  . Sexual activity: Not Currently  Lifestyle  . Physical activity    Days per week: Not on file    Minutes per session: Not on file  . Stress: Not on file  Relationships  . Social Herbalist on phone: Not on file    Gets together: Not on  file    Attends religious service: Not on file    Active member of club or organization: Not on file    Attends meetings of clubs or organizations: Not on file    Relationship status: Not on file  . Intimate partner violence    Fear of current or ex partner: Not on file    Emotionally abused: Not on file    Physically abused: Not on file    Forced sexual activity: Not on file  Other Topics Concern  . Not on file  Social History Narrative  . Not on file    FAMILY HISTORY:  Family History  Problem Relation Age of Onset  . Anesthesia problems Neg Hx     CURRENT MEDICATIONS:  Outpatient Encounter Medications as of 01/16/2019  Medication Sig   . atorvastatin (LIPITOR) 20 MG tablet Take 1 tablet by mouth daily.  . Calcium Carbonate-Vitamin D (CALCIUM 600+D) 600-400 MG-UNIT per tablet Take 1 tablet by mouth daily.    . cholecalciferol (VITAMIN D) 1000 UNITS tablet Take 1,000 Units by mouth daily.    Marland Kitchen escitalopram (LEXAPRO) 10 MG tablet Take 1 tablet (10 mg total) by mouth daily.  Marland Kitchen gabapentin (NEURONTIN) 100 MG capsule Take 100 mg by mouth 3 (three) times daily.   Marland Kitchen letrozole (FEMARA) 2.5 MG tablet TAKE 1 TABLET DAILY  . lisinopril (PRINIVIL,ZESTRIL) 20 MG tablet Take 1 tablet by mouth every evening.  . metFORMIN (GLUCOPHAGE) 500 MG tablet Take 500 mg by mouth every evening.   . Multiple Vitamin (MULTIVITAMIN) tablet Take 1 tablet by mouth daily.    . [DISCONTINUED] HYDROcodone-acetaminophen (NORCO/VICODIN) 5-325 MG tablet Take 1 tablet by mouth every 4 (four) hours as needed. (Patient not taking: Reported on 07/15/2018)   No facility-administered encounter medications on file as of 01/16/2019.     ALLERGIES:  Allergies  Allergen Reactions  . Ciprofloxacin Nausea Only  . Ferrous Sulfate Nausea And Vomiting  . Sulfa Antibiotics Diarrhea  . Sulfa Drugs Cross Reactors Nausea And Vomiting     PHYSICAL EXAM:  ECOG Performance status: 1  Vitals:   01/16/19 1009  BP: (!) 144/79  Pulse: (!) 59  Resp: 18  Temp: (!) 97.1 F (36.2 C)  SpO2: 98%   Filed Weights   01/16/19 1009  Weight: 204 lb 12.8 oz (92.9 kg)    Physical Exam Constitutional:      Appearance: Normal appearance. She is normal weight.  Cardiovascular:     Rate and Rhythm: Normal rate and regular rhythm.     Heart sounds: Normal heart sounds.  Pulmonary:     Effort: Pulmonary effort is normal.     Breath sounds: Normal breath sounds.  Abdominal:     General: Bowel sounds are normal.     Palpations: Abdomen is soft.  Musculoskeletal: Normal range of motion.  Skin:    General: Skin is warm.  Neurological:     Mental Status: She is alert and oriented to  person, place, and time. Mental status is at baseline.  Psychiatric:        Mood and Affect: Mood normal.        Behavior: Behavior normal.        Thought Content: Thought content normal.        Judgment: Judgment normal.      LABORATORY DATA:  I have reviewed the labs as listed.  CBC    Component Value Date/Time   WBC 5.5 01/09/2019 1000   RBC 3.66 (L) 01/09/2019  1000   HGB 12.0 01/09/2019 1000   HCT 37.1 01/09/2019 1000   PLT 168 01/09/2019 1000   MCV 101.4 (H) 01/09/2019 1000   MCH 32.8 01/09/2019 1000   MCHC 32.3 01/09/2019 1000   RDW 13.2 01/09/2019 1000   LYMPHSABS 1.0 01/09/2019 1000   MONOABS 0.6 01/09/2019 1000   EOSABS 0.2 01/09/2019 1000   BASOSABS 0.1 01/09/2019 1000   CMP Latest Ref Rng & Units 01/09/2019 07/15/2018 01/05/2018  Glucose 70 - 99 mg/dL 166(H) 108(H) 138(H)  BUN 8 - 23 mg/dL 25(H) 30(H) 25(H)  Creatinine 0.44 - 1.00 mg/dL 1.19(H) 1.27(H) 1.24(H)  Sodium 135 - 145 mmol/L 142 139 139  Potassium 3.5 - 5.1 mmol/L 4.4 4.5 4.6  Chloride 98 - 111 mmol/L 103 106 105  CO2 22 - 32 mmol/L 26 23 28   Calcium 8.9 - 10.3 mg/dL 10.4(H) 10.1 9.9  Total Protein 6.5 - 8.1 g/dL 7.2 7.1 7.2  Total Bilirubin 0.3 - 1.2 mg/dL 0.7 0.2(L) 0.7  Alkaline Phos 38 - 126 U/L 58 56 65  AST 15 - 41 U/L 23 22 29   ALT 0 - 44 U/L 23 22 29        DIAGNOSTIC IMAGING:  I have independently reviewed the mammogram scans and discussed with the patient.  I personally performed a face-to-face visit.  All questions were answered to patient's stated satisfaction. Encouraged patient to call with any new concerns or questions before his next visit to the cancer center and we can certain see him sooner, if needed.     ASSESSMENT & PLAN:   Invasive ductal carcinoma of left breast 1.  Stage IIIa, invasive ductal carcinoma the left breast: - Patient was diagnosed at the beginning of 2012 with invasive ductal carcinoma. -She presented with a large mass, retraction of the nipple, 6 x 6  cm clinically with suspicious lymph nodes in the right axillary region. - She was ER/PR 100% positive, HER-2 negative, Ki-67 marker 66%. -She was treated with neoadjuvant letrozole which started 09/19/2010 for several months then continued. -She then went a left modified radical mastectomy on 01/09/2011 4 of 4 lymph nodes were positive. -She completed radiation in 2013 postoperatively  -BCI not able to be performed due to her nodal status. -Her last mammogram was done on 01/09/2019 which was a RADS category 1 negative. - She is now on Femara which she will take for 10 years of tolerating therapy well. She will have completed 10 years in 09/2020. - She will follow-up in 6 months with labs.   2.  Anemia: - Due to CKD. -Labs done on 01/09/2019 showed ferritin 140, percent saturation 20, hemoglobin 12 -We will continue to monitor  3.  Osteopenia: -DEXA scan was done on 01/04/2017 which showed osteopenia with a T score of -1.6. - Patient is taking daily calcium. -DEXA scan done on 01/09/2019 showed osteopenia with a T score of -1.5. -Patient will continue taking calcium daily.      Orders placed this encounter:  Orders Placed This Encounter  Procedures  . Lactate dehydrogenase  . Vitamin B12  . VITAMIN D 25 Hydroxy (Vit-D Deficiency, Fractures)  . CBC with Differential/Platelet  . Comprehensive metabolic panel  . Ferritin  . Iron and TIBC  . Folate      Francene Finders, FNP-C [facility] [phone]

## 2019-01-16 NOTE — Assessment & Plan Note (Addendum)
1.  Stage IIIa, invasive ductal carcinoma the left breast: - Patient was diagnosed at the beginning of 2012 with invasive ductal carcinoma. -She presented with a large mass, retraction of the nipple, 6 x 6 cm clinically with suspicious lymph nodes in the right axillary region. - She was ER/PR 100% positive, HER-2 negative, Ki-67 marker 66%. -She was treated with neoadjuvant letrozole which started 09/19/2010 for several months then continued. -She then went a left modified radical mastectomy on 01/09/2011 4 of 4 lymph nodes were positive. -She completed radiation in 2013 postoperatively  -BCI not able to be performed due to her nodal status. -Her last mammogram was done on 01/09/2019 which was a RADS category 1 negative. - She is now on Femara which she will take for 10 years of tolerating therapy well. She will have completed 10 years in 09/2020. - She will follow-up in 6 months with labs.   2.  Anemia: - Due to CKD. -Labs done on 01/09/2019 showed ferritin 140, percent saturation 20, hemoglobin 12 -We will continue to monitor  3.  Osteopenia: -DEXA scan was done on 01/04/2017 which showed osteopenia with a T score of -1.6. - Patient is taking daily calcium. -DEXA scan done on 01/09/2019 showed osteopenia with a T score of -1.5. -Patient will continue taking calcium daily.

## 2019-07-17 ENCOUNTER — Inpatient Hospital Stay (HOSPITAL_COMMUNITY): Payer: Medicare Other | Attending: Hematology

## 2019-07-24 ENCOUNTER — Ambulatory Visit (HOSPITAL_COMMUNITY): Payer: Medicare Other | Admitting: Hematology

## 2020-02-02 ENCOUNTER — Other Ambulatory Visit (HOSPITAL_COMMUNITY): Payer: Self-pay

## 2020-02-02 DIAGNOSIS — C50912 Malignant neoplasm of unspecified site of left female breast: Secondary | ICD-10-CM

## 2020-02-05 ENCOUNTER — Other Ambulatory Visit: Payer: Self-pay

## 2020-02-05 ENCOUNTER — Inpatient Hospital Stay (HOSPITAL_COMMUNITY): Payer: Medicare Other | Attending: Hematology

## 2020-02-05 DIAGNOSIS — I10 Essential (primary) hypertension: Secondary | ICD-10-CM | POA: Diagnosis not present

## 2020-02-05 DIAGNOSIS — I129 Hypertensive chronic kidney disease with stage 1 through stage 4 chronic kidney disease, or unspecified chronic kidney disease: Secondary | ICD-10-CM | POA: Insufficient documentation

## 2020-02-05 DIAGNOSIS — D539 Nutritional anemia, unspecified: Secondary | ICD-10-CM | POA: Insufficient documentation

## 2020-02-05 DIAGNOSIS — Z923 Personal history of irradiation: Secondary | ICD-10-CM | POA: Insufficient documentation

## 2020-02-05 DIAGNOSIS — C50912 Malignant neoplasm of unspecified site of left female breast: Secondary | ICD-10-CM | POA: Insufficient documentation

## 2020-02-05 DIAGNOSIS — Z87891 Personal history of nicotine dependence: Secondary | ICD-10-CM | POA: Diagnosis not present

## 2020-02-05 DIAGNOSIS — Z7984 Long term (current) use of oral hypoglycemic drugs: Secondary | ICD-10-CM | POA: Diagnosis not present

## 2020-02-05 DIAGNOSIS — E1122 Type 2 diabetes mellitus with diabetic chronic kidney disease: Secondary | ICD-10-CM | POA: Diagnosis not present

## 2020-02-05 DIAGNOSIS — M8580 Other specified disorders of bone density and structure, unspecified site: Secondary | ICD-10-CM | POA: Diagnosis not present

## 2020-02-05 DIAGNOSIS — Z9012 Acquired absence of left breast and nipple: Secondary | ICD-10-CM | POA: Diagnosis not present

## 2020-02-05 DIAGNOSIS — Z79899 Other long term (current) drug therapy: Secondary | ICD-10-CM | POA: Insufficient documentation

## 2020-02-05 DIAGNOSIS — Z170 Estrogen receptor positive status [ER+]: Secondary | ICD-10-CM | POA: Insufficient documentation

## 2020-02-05 LAB — CBC WITH DIFFERENTIAL/PLATELET
Abs Immature Granulocytes: 0.03 10*3/uL (ref 0.00–0.07)
Basophils Absolute: 0.1 10*3/uL (ref 0.0–0.1)
Basophils Relative: 1 %
Eosinophils Absolute: 0.2 10*3/uL (ref 0.0–0.5)
Eosinophils Relative: 3 %
HCT: 33.9 % — ABNORMAL LOW (ref 36.0–46.0)
Hemoglobin: 10.8 g/dL — ABNORMAL LOW (ref 12.0–15.0)
Immature Granulocytes: 1 %
Lymphocytes Relative: 15 %
Lymphs Abs: 0.9 10*3/uL (ref 0.7–4.0)
MCH: 32.1 pg (ref 26.0–34.0)
MCHC: 31.9 g/dL (ref 30.0–36.0)
MCV: 100.9 fL — ABNORMAL HIGH (ref 80.0–100.0)
Monocytes Absolute: 0.6 10*3/uL (ref 0.1–1.0)
Monocytes Relative: 10 %
Neutro Abs: 4.4 10*3/uL (ref 1.7–7.7)
Neutrophils Relative %: 70 %
Platelets: 184 10*3/uL (ref 150–400)
RBC: 3.36 MIL/uL — ABNORMAL LOW (ref 3.87–5.11)
RDW: 13.7 % (ref 11.5–15.5)
WBC: 6.2 10*3/uL (ref 4.0–10.5)
nRBC: 0 % (ref 0.0–0.2)

## 2020-02-05 LAB — COMPREHENSIVE METABOLIC PANEL
ALT: 20 U/L (ref 0–44)
AST: 22 U/L (ref 15–41)
Albumin: 4.1 g/dL (ref 3.5–5.0)
Alkaline Phosphatase: 64 U/L (ref 38–126)
Anion gap: 10 (ref 5–15)
BUN: 29 mg/dL — ABNORMAL HIGH (ref 8–23)
CO2: 23 mmol/L (ref 22–32)
Calcium: 9.9 mg/dL (ref 8.9–10.3)
Chloride: 103 mmol/L (ref 98–111)
Creatinine, Ser: 1.4 mg/dL — ABNORMAL HIGH (ref 0.44–1.00)
GFR, Estimated: 37 mL/min — ABNORMAL LOW (ref >60)
Glucose, Bld: 209 mg/dL — ABNORMAL HIGH (ref 70–99)
Potassium: 4.4 mmol/L (ref 3.5–5.1)
Sodium: 136 mmol/L (ref 135–145)
Total Bilirubin: 0.3 mg/dL (ref 0.3–1.2)
Total Protein: 7.2 g/dL (ref 6.5–8.1)

## 2020-02-05 LAB — VITAMIN D 25 HYDROXY (VIT D DEFICIENCY, FRACTURES): Vit D, 25-Hydroxy: 75.21 ng/mL (ref 30–100)

## 2020-02-05 LAB — IRON AND TIBC
Iron: 67 ug/dL (ref 28–170)
Saturation Ratios: 19 % (ref 10.4–31.8)
TIBC: 346 ug/dL (ref 250–450)
UIBC: 279 ug/dL

## 2020-02-05 LAB — FERRITIN: Ferritin: 67 ng/mL (ref 11–307)

## 2020-02-05 LAB — VITAMIN B12: Vitamin B-12: 414 pg/mL (ref 180–914)

## 2020-02-05 LAB — LACTATE DEHYDROGENASE: LDH: 131 U/L (ref 98–192)

## 2020-02-05 LAB — FOLATE: Folate: 62.9 ng/mL (ref >5.9)

## 2020-02-12 ENCOUNTER — Inpatient Hospital Stay (HOSPITAL_BASED_OUTPATIENT_CLINIC_OR_DEPARTMENT_OTHER): Payer: Medicare Other | Admitting: Oncology

## 2020-02-12 ENCOUNTER — Other Ambulatory Visit: Payer: Self-pay

## 2020-02-12 VITALS — BP 135/65 | HR 58 | Temp 97.1°F | Resp 18 | Wt 201.0 lb

## 2020-02-12 DIAGNOSIS — C50912 Malignant neoplasm of unspecified site of left female breast: Secondary | ICD-10-CM | POA: Diagnosis not present

## 2020-02-12 NOTE — Progress Notes (Signed)
[place], [place] [address]   CLINIC:  Medical Oncology/Hematology  PCP:  Donalynn Furlong, MD [place] [address] [phone]   REASON FOR VISIT: Follow-up for breast cancer  CURRENT THERAPY: Letrozole   CANCER STAGING: Cancer Staging Invasive ductal carcinoma of left breast ([place]) Staging form: Breast, AJCC 7th Edition - Clinical: Stage IIIA (T2, N2a, cM0) - Signed by Baird Cancer, PA-C on 10/13/2012 - Pathologic: No stage assigned - Unsigned    INTERVAL HISTORY:  Stacy [name] 86 y.o. female returns for routine follow-up for breast cancer.  Patient reports she has been doing well since her last visit.  Her energy level is good.  She denies any unwanted side effects from letrozole.  She is currently caring for her sister who are suffering from a nerve disorder.  She denies any pain.  Denies any new lumps or bumps.  Denies any nausea, vomiting, or diarrhea. Denies any new pains. Had not noticed any recent bleeding such as epistaxis, hematuria or hematochezia. Denies recent chest pain on exertion, shortness of breath on minimal exertion, pre-syncopal episodes, or palpitations. Denies any numbness or tingling in hands or feet. Denies any recent fevers, infections, or recent hospitalizations. Patient reports appetite at 100% and energy level at 100%.  She is eating well maintaining her weight at this time.   REVIEW OF SYSTEMS:  Review of Systems  All other systems reviewed and are negative.    PAST MEDICAL/SURGICAL HISTORY:  Past Medical History:  Diagnosis Date  . Anemia   . Anxiety   . Cancer ([place])   . Depression   . Diabetes mellitus type II   . Fracture of hand 09/29/12   fell and fractured right hand  . Hyperlipemia   . Hypertension   . Invasive ductal carcinoma of left breast ([place]) 10/03/2010  . Normocytic normochromic anemia 06/23/2016   Past Surgical History:  Procedure Laterality Date  .  APPENDECTOMY    . CATARACT EXTRACTION W/ INTRAOCULAR LENS IMPLANT  08/26/10   Right; [facility]  . CATARACT EXTRACTION W/PHACO  10/14/2010   Procedure: CATARACT EXTRACTION PHACO AND INTRAOCULAR LENS PLACEMENT (IOC);  Surgeon: Elta Guadeloupe T. Gershon Crane;  Location: [facility];  Service: Ophthalmology;  Laterality: Left;  CDE:  8.79  . INCISIONAL BREAST BIOPSY  09/05/10   Left, [facility], [facility]  . MASTECTOMY    . YAG LASER APPLICATION Left 11/07/537   Procedure: YAG LASER APPLICATION;  Surgeon: Rutherford Guys, MD;  Location: [facility];  Service: Ophthalmology;  Laterality: Left;     SOCIAL HISTORY:  Social History   Socioeconomic History  . Marital status: Married    Spouse name: Not on file  . Number of children: Not on file  . Years of education: Not on file  . Highest education level: Not on file  Occupational History  . Not on file  Tobacco Use  . Smoking status: Former Research scientist (life sciences)  . Smokeless tobacco: Never Used  Substance and Sexual Activity  . Alcohol use: No  . Drug use: No  . Sexual activity: Not Currently  Other Topics Concern  . Not on file  Social History Narrative  . Not on file   Social Determinants of Health   Financial Resource Strain:   . Difficulty of Paying Living Expenses: Not on file  Food Insecurity:   . Worried About Charity fundraiser in the Last Year: Not on file  . Ran Out of Food  in the Last Year: Not on file  Transportation Needs:   . Lack of Transportation (Medical): Not on file  . Lack of Transportation (Non-Medical): Not on file  Physical Activity:   . Days of Exercise per Week: Not on file  . Minutes of Exercise per Session: Not on file  Stress:   . Feeling of Stress : Not on file  Social Connections:   . Frequency of Communication with Friends and Family: Not on file  . Frequency of Social Gatherings with Friends and Family: Not on file  . Attends Religious Services: Not on file  . Active Member of Clubs or Organizations: Not on file  . Attends Theatre manager Meetings: Not on file  . Marital Status: Not on file  Intimate Partner Violence:   . Fear of Current or Ex-Partner: Not on file  . Emotionally Abused: Not on file  . Physically Abused: Not on file  . Sexually Abused: Not on file    FAMILY HISTORY:  Family History  Problem Relation Age of Onset  . Anesthesia problems Neg Hx     CURRENT MEDICATIONS:  Outpatient Encounter Medications as of 02/12/2020  Medication Sig  . atorvastatin (LIPITOR) 20 MG tablet Take 1 tablet by mouth daily.  . Calcium Carbonate-Vitamin D (CALCIUM 600+D) 600-400 MG-UNIT per tablet Take 1 tablet by mouth daily.    . cholecalciferol (VITAMIN D) 1000 UNITS tablet Take 1,000 Units by mouth daily.    Marland Kitchen escitalopram (LEXAPRO) 10 MG tablet Take 1 tablet (10 mg total) by mouth daily.  Marland Kitchen gabapentin (NEURONTIN) 100 MG capsule Take 100 mg by mouth 3 (three) times daily.   Marland Kitchen letrozole (FEMARA) 2.5 MG tablet Take 1 tablet (2.5 mg total) by mouth daily.  Marland Kitchen lisinopril (PRINIVIL,ZESTRIL) 20 MG tablet Take 1 tablet by mouth every evening.  . metFORMIN (GLUCOPHAGE) 500 MG tablet Take 500 mg by mouth every evening.   . Multiple Vitamin (MULTIVITAMIN) tablet Take 1 tablet by mouth daily.     No facility-administered encounter medications on file as of 02/12/2020.    ALLERGIES:  Allergies  Allergen Reactions  . Ciprofloxacin Nausea Only  . Ferrous Sulfate Nausea And Vomiting  . Sulfa Antibiotics Diarrhea  . Sulfa Drugs Cross Reactors Nausea And Vomiting     PHYSICAL EXAM:  ECOG Performance status: 1  There were no vitals filed for this visit. There were no vitals filed for this visit.  Physical Exam Constitutional:      Appearance: Normal appearance. She is normal weight.  Cardiovascular:     Rate and Rhythm: Normal rate and regular rhythm.     Heart sounds: Normal heart sounds.  Pulmonary:     Effort: Pulmonary effort is normal.     Breath sounds: Normal breath sounds.  Abdominal:     General:  Bowel sounds are normal.     Palpations: Abdomen is soft.  Musculoskeletal:        General: Normal range of motion.  Skin:    General: Skin is warm.  Neurological:     Mental Status: She is alert and oriented to person, place, and time. Mental status is at baseline.  Psychiatric:        Mood and Affect: Mood normal.        Behavior: Behavior normal.        Thought Content: Thought content normal.        Judgment: Judgment normal.      LABORATORY DATA:  I have reviewed the  labs as listed.  CBC    Component Value Date/Time   WBC 6.2 02/05/2020 1021   RBC 3.36 (L) 02/05/2020 1021   HGB 10.8 (L) 02/05/2020 1021   HCT 33.9 (L) 02/05/2020 1021   PLT 184 02/05/2020 1021   MCV 100.9 (H) 02/05/2020 1021   MCH 32.1 02/05/2020 1021   MCHC 31.9 02/05/2020 1021   RDW 13.7 02/05/2020 1021   LYMPHSABS 0.9 02/05/2020 1021   MONOABS 0.6 02/05/2020 1021   EOSABS 0.2 02/05/2020 1021   BASOSABS 0.1 02/05/2020 1021   CMP Latest Ref Rng & Units 02/05/2020 01/09/2019 07/15/2018  Glucose 70 - 99 mg/dL 209(H) 166(H) 108(H)  BUN 8 - 23 mg/dL 29(H) 25(H) 30(H)  Creatinine 0.44 - 1.00 mg/dL 1.40(H) 1.19(H) 1.27(H)  Sodium 135 - 145 mmol/L 136 142 139  Potassium 3.5 - 5.1 mmol/L 4.4 4.4 4.5  Chloride 98 - 111 mmol/L 103 103 106  CO2 22 - 32 mmol/L 23 26 23   Calcium 8.9 - 10.3 mg/dL 9.9 10.4(H) 10.1  Total Protein 6.5 - 8.1 g/dL 7.2 7.2 7.1  Total Bilirubin 0.3 - 1.2 mg/dL 0.3 0.7 0.2(L)  Alkaline Phos 38 - 126 U/L 64 58 56  AST 15 - 41 U/L 22 23 22   ALT 0 - 44 U/L 20 23 22        DIAGNOSTIC IMAGING:  I have independently reviewed the mammogram scans and discussed with the patient.  I personally performed a face-to-face visit.  All questions were answered to patient's stated satisfaction. Encouraged patient to call with any new concerns or questions before his next visit to the cancer center and we can certain see him sooner, if needed.     ASSESSMENT & PLAN:  .  Stage IIIa, invasive  ductal carcinoma the left breast: - Patient was diagnosed at the beginning of 2012 with invasive ductal carcinoma. -She presented with a large mass, retraction of the nipple, 6 x 6 cm clinically with suspicious lymph nodes in the right axillary region. - She was ER/PR 100% positive, HER-2 negative, Ki-67 marker 66%. -She was treated with neoadjuvant letrozole which started 09/19/2010 for several months then continued. -She then went a left modified radical mastectomy on 01/09/2011 4 of 4 lymph nodes were positive. -She completed radiation in 2013 postoperatively  -BCI not able to be performed due to her nodal status. -Last mammogram was on 01/09/2019 which was BI-RADS Category 1 negative. -She is due for a repeat mammogram ASAP.  Orders placed. - She is now on Femara which she will take for 10 years of tolerating therapy well. She will have completed 10 years in 09/2020. - She will follow-up in 6 months with labs.   2.    Macrocytic anemia: - Due to CKD. -Labs done on 02/05/2020 showed a ferritin of 67, saturation ratio of 19 and a hemoglobin of 10.8. -We will recheck labs in about 6 months.  She is intolerant of oral iron secondary to severe constipation.  She has never had an IV iron infusion.  Hemoglobin and iron levels appear to be trending down. -Denies any GI bleeding. -Last colonoscopy reported in April 2015. -We will continue to monitor  3.  Osteopenia: -DEXA scan was done on 01/04/2017 which showed osteopenia with a T score of -1.6. - Patient is taking daily calcium. -DEXA scan done on 01/09/2019 showed osteopenia with a T score of -1.5. -Patient will be due for repeat DEXA in October 2022. -Patient will continue taking calcium daily.  4.  Chronic kidney disease: -Not followed by a nephrologist. -Kidney function has trended up and is 1.40. -I have encouraged her to hydrate as much as she can. -We will recheck labs in 6 months.  If persistently elevated would recommend  nephrology consult.  Disposition: -We will schedule mammogram ASAP.  Will call patient with results. -Return to clinic in 6 months with repeat labs (CBC, CMP, ferritin, iron panel, vitamin D, vitamin B12 and folate) and assessment.   No problem-specific Assessment & Plan notes found for this encounter.  Orders placed this encounter:  No orders of the defined types were placed in this encounter.  Faythe Casa, NP 02/12/2020 11:39 AM  [place] [phone]

## 2020-02-12 NOTE — Progress Notes (Signed)
Patient is taking letrozole as prescribed and denies any side effects.

## 2020-02-26 ENCOUNTER — Ambulatory Visit (HOSPITAL_COMMUNITY): Payer: Medicare Other

## 2020-03-13 ENCOUNTER — Other Ambulatory Visit: Payer: Self-pay

## 2020-03-13 ENCOUNTER — Ambulatory Visit (HOSPITAL_COMMUNITY)
Admission: RE | Admit: 2020-03-13 | Discharge: 2020-03-13 | Disposition: A | Discharge status: Home / Self Care | Payer: Medicare Other | Source: Ambulatory Visit | Attending: Oncology | Admitting: Oncology

## 2020-03-13 DIAGNOSIS — Z853 Personal history of malignant neoplasm of breast: Secondary | ICD-10-CM | POA: Insufficient documentation

## 2020-03-13 DIAGNOSIS — Z1231 Encounter for screening mammogram for malignant neoplasm of breast: Secondary | ICD-10-CM | POA: Insufficient documentation

## 2020-03-13 DIAGNOSIS — C50912 Malignant neoplasm of unspecified site of left female breast: Secondary | ICD-10-CM

## 2020-04-15 ENCOUNTER — Other Ambulatory Visit (HOSPITAL_COMMUNITY): Payer: Self-pay

## 2020-04-15 DIAGNOSIS — C50912 Malignant neoplasm of unspecified site of left female breast: Secondary | ICD-10-CM

## 2020-04-15 MED ORDER — LETROZOLE 2.5 MG PO TABS: 2.5000 mg | ORAL_TABLET | Freq: Every day | ORAL | 3 refills | Status: DC

## 2020-04-16 ENCOUNTER — Other Ambulatory Visit (HOSPITAL_COMMUNITY): Payer: Self-pay

## 2020-04-16 DIAGNOSIS — C50912 Malignant neoplasm of unspecified site of left female breast: Secondary | ICD-10-CM

## 2020-04-16 MED ORDER — LETROZOLE 2.5 MG PO TABS: 2.5000 mg | ORAL_TABLET | Freq: Every day | ORAL | 3 refills | Status: DC

## 2020-04-23 ENCOUNTER — Other Ambulatory Visit (HOSPITAL_COMMUNITY): Payer: Self-pay

## 2020-04-23 ENCOUNTER — Telehealth (HOSPITAL_COMMUNITY): Payer: Self-pay | Admitting: *Deleted

## 2020-04-23 DIAGNOSIS — C50912 Malignant neoplasm of unspecified site of left female breast: Secondary | ICD-10-CM

## 2020-04-23 MED ORDER — LETROZOLE 2.5 MG PO TABS: 2.5000 mg | ORAL_TABLET | Freq: Every day | ORAL | 3 refills | Status: DC

## 2020-08-14 ENCOUNTER — Encounter (INDEPENDENT_AMBULATORY_CARE_PROVIDER_SITE_OTHER): Payer: Self-pay | Admitting: *Deleted

## 2020-08-22 NOTE — Progress Notes (Signed)
[facility] [address], [place] [address]   Patient Care Team: Dhivianathan, Candida Peeling, MD as PCP - General (Family Medicine)  SUMMARY OF ONCOLOGIC HISTORY: Oncology History   No history exists.    CHIEF COMPLIANT: Follow-up for left breast cancer   INTERVAL HISTORY: [name] is a 87 y.o. female here today for follow up of her left breast cancer. Her last visit was on 02/12/2020.   Today she reports feeling okay. She is not currently taking any iron supplements.   REVIEW OF SYSTEMS:   Review of Systems  Constitutional:  Positive for appetite change (75%) and fatigue (25%).  All other systems reviewed and are negative.  I have reviewed the past medical history, past surgical history, social history and family history with the patient and they are unchanged from previous note.   ALLERGIES:   is allergic to ciprofloxacin, ferrous sulfate, sulfa antibiotics, and sulfa drugs cross reactors.   MEDICATIONS:  Current Outpatient Medications  Medication Sig Dispense Refill   atorvastatin (LIPITOR) 20 MG tablet Take 1 tablet by mouth daily.     Calcium Carbonate-Vitamin D (CALCIUM 600+D) 600-400 MG-UNIT per tablet Take 1 tablet by mouth daily.       cholecalciferol (VITAMIN D) 1000 UNITS tablet Take 1,000 Units by mouth daily.       cyanocobalamin 1000 MCG tablet Take by mouth.     escitalopram (LEXAPRO) 10 MG tablet Take 1 tablet (10 mg total) by mouth daily. 90 tablet 1   gabapentin (NEURONTIN) 100 MG capsule Take 100 mg by mouth 3 (three) times daily.   2   letrozole (FEMARA) 2.5 MG tablet Take 1 tablet (2.5 mg total) by mouth daily. 90 tablet 3   lisinopril (PRINIVIL,ZESTRIL) 20 MG tablet Take 1 tablet by mouth every evening.     metFORMIN (GLUCOPHAGE) 500 MG tablet Take 1,000 mg by mouth every evening.      Multiple Vitamin (MULTIVITAMIN) tablet Take 1 tablet by mouth daily.       No current facility-administered medications for this visit.      PHYSICAL EXAMINATION: Performance status (ECOG): 1 - Symptomatic but completely ambulatory  There were no vitals filed for this visit. Wt Readings from Last 3 Encounters:  02/12/20 201 lb (91.2 kg)  01/16/19 204 lb 12.8 oz (92.9 kg)  07/15/18 202 lb 12.8 oz (92 kg)   Physical Exam Vitals reviewed.  Constitutional:      Appearance: Normal appearance.  Cardiovascular:     Rate and Rhythm: Normal rate and regular rhythm.     Pulses: Normal pulses.     Heart sounds: Normal heart sounds.  Pulmonary:     Effort: Pulmonary effort is normal.     Breath sounds: Normal breath sounds.  Chest:  Breasts:    Right: No inverted nipple, mass, nipple discharge, skin change or axillary adenopathy.     Left: Absent. No axillary adenopathy. Skin change: mastectomy site within normal limits. Lymphadenopathy:     Upper Body:     Right upper body: No axillary or pectoral adenopathy.     Left upper body: No axillary or pectoral adenopathy.  Neurological:     General: No focal deficit present.     Mental Status: She is alert and oriented to person, place, and time.  Psychiatric:        Mood and Affect: Mood normal.        Behavior: Behavior normal.    Breast Exam Chaperone:  Thana Ates     LABORATORY DATA:  I have reviewed the data as listed CMP Latest Ref Rng & Units 02/05/2020 01/09/2019 07/15/2018  Glucose 70 - 99 mg/dL 209(H) 166(H) 108(H)  BUN 8 - 23 mg/dL 29(H) 25(H) 30(H)  Creatinine 0.44 - 1.00 mg/dL 1.40(H) 1.19(H) 1.27(H)  Sodium 135 - 145 mmol/L 136 142 139  Potassium 3.5 - 5.1 mmol/L 4.4 4.4 4.5  Chloride 98 - 111 mmol/L 103 103 106  CO2 22 - 32 mmol/L _0 Calcium 8.9 - 10.3 mg/dL 9.9 10.4(H) 10.1  Total Protein 6.5 - 8.1 g/dL 7.2 7.2 7.1  Total Bilirubin 0.3 - 1.2 mg/dL 0.3 0.7 0.2(L)  Alkaline Phos 38 - 126 U/L 64 58 56  AST 15 - 41 U/L _1 ALT 0 - 44 U/L _2 No results found for: UXN235 Lab Results  Component Value Date   WBC 6.2 02/05/2020    HGB 10.8 (L) 02/05/2020   HCT 33.9 (L) 02/05/2020   MCV 100.9 (H) 02/05/2020   PLT 184 02/05/2020   NEUTROABS 4.4 02/05/2020    ASSESSMENT:  1.  Stage IIIa, invasive ductal carcinoma the left breast: - Patient was diagnosed at the beginning of 2012 with invasive ductal carcinoma. -She presented with a large mass, retraction of the nipple, 6 x 6 cm clinically with suspicious lymph nodes in the right axillary region. - She was ER/PR 100% positive, HER-2 negative, Ki-67 marker 66%. -She was treated with neoadjuvant letrozole which started 09/19/2010 for several months then continued. -She then went a left modified radical mastectomy on 01/09/2011 4 of 4 lymph nodes were positive. -She completed radiation in 2013 postoperatively -BCI not able to be performed due to her nodal status.    2.    Macrocytic anemia: - Due to CKD. -Last colonoscopy reported in April 2015.    3.  Osteopenia: -DEXA scan was done on 01/04/2017 which showed osteopenia with a T score of -1.6. -DEXA scan done on 01/09/2019 showed osteopenia with a T score of -1.5. -Patient will be due for repeat DEXA in October 2022.     PLAN:  1.  Stage IIIa, invasive ductal carcinoma the left breast: - Left mastectomy site is within normal limits.  Right breast has no palpable masses.  No palpable adenopathy. - Reviewed mammogram of the right breast from 03/13/2021 which was BI-RADS Category 1. - Reviewed labs from 08/26/2020 which showed normal LFTs. - She will continue letrozole until end of July. - RTC 6 months for follow-up.    2.    Macrocytic anemia: - Hemoglobin is 10.4 with MCV 100.  Ferritin is 55 and is downtrending.  Combination anemia from mild CKD and relative iron deficiency. - Recommend starting iron tablet every other day with stool softener.  We will plan to repeat iron panel in 6 months.    3.  Osteopenia: - Continue calcium and vitamin D supplements.   Breast Cancer therapy associated bone loss: I  have recommended calcium, Vitamin D and weight bearing exercises.  Orders placed this encounter:  No orders of the defined types were placed in this encounter.   The patient has a good understanding of the overall plan. She agrees with it. She will call with any problems that may develop before the next visit here.  Derek Jack, MD [place] [phone]   I, Thana Ates, am acting as a scribe for Dr. Derek Jack.  Kinnie Scales  MD, have reviewed the above documentation for accuracy and completeness, and I agree with the above.

## 2020-08-23 ENCOUNTER — Other Ambulatory Visit (HOSPITAL_COMMUNITY): Payer: Self-pay | Admitting: *Deleted

## 2020-08-23 DIAGNOSIS — D518 Other vitamin B12 deficiency anemias: Secondary | ICD-10-CM

## 2020-08-23 DIAGNOSIS — D649 Anemia, unspecified: Secondary | ICD-10-CM

## 2020-08-23 DIAGNOSIS — E559 Vitamin D deficiency, unspecified: Secondary | ICD-10-CM

## 2020-08-23 DIAGNOSIS — E538 Deficiency of other specified B group vitamins: Secondary | ICD-10-CM

## 2020-08-23 DIAGNOSIS — C50912 Malignant neoplasm of unspecified site of left female breast: Secondary | ICD-10-CM

## 2020-08-26 ENCOUNTER — Other Ambulatory Visit: Payer: Self-pay

## 2020-08-26 ENCOUNTER — Inpatient Hospital Stay (HOSPITAL_COMMUNITY): Payer: Medicare Other | Attending: Hematology | Admitting: Hematology

## 2020-08-26 ENCOUNTER — Other Ambulatory Visit (HOSPITAL_COMMUNITY): Payer: Self-pay | Admitting: Hematology

## 2020-08-26 ENCOUNTER — Inpatient Hospital Stay (HOSPITAL_COMMUNITY): Payer: Medicare Other

## 2020-08-26 VITALS — BP 125/71 | HR 74 | Temp 97.0°F | Resp 18 | Wt 195.4 lb

## 2020-08-26 DIAGNOSIS — D649 Anemia, unspecified: Secondary | ICD-10-CM

## 2020-08-26 DIAGNOSIS — Z9012 Acquired absence of left breast and nipple: Secondary | ICD-10-CM | POA: Insufficient documentation

## 2020-08-26 DIAGNOSIS — Z170 Estrogen receptor positive status [ER+]: Secondary | ICD-10-CM | POA: Diagnosis not present

## 2020-08-26 DIAGNOSIS — N189 Chronic kidney disease, unspecified: Secondary | ICD-10-CM | POA: Diagnosis not present

## 2020-08-26 DIAGNOSIS — M8580 Other specified disorders of bone density and structure, unspecified site: Secondary | ICD-10-CM | POA: Insufficient documentation

## 2020-08-26 DIAGNOSIS — E559 Vitamin D deficiency, unspecified: Secondary | ICD-10-CM

## 2020-08-26 DIAGNOSIS — C50112 Malignant neoplasm of central portion of left female breast: Secondary | ICD-10-CM

## 2020-08-26 DIAGNOSIS — D518 Other vitamin B12 deficiency anemias: Secondary | ICD-10-CM

## 2020-08-26 DIAGNOSIS — Z923 Personal history of irradiation: Secondary | ICD-10-CM | POA: Diagnosis not present

## 2020-08-26 DIAGNOSIS — D631 Anemia in chronic kidney disease: Secondary | ICD-10-CM | POA: Insufficient documentation

## 2020-08-26 DIAGNOSIS — E538 Deficiency of other specified B group vitamins: Secondary | ICD-10-CM

## 2020-08-26 DIAGNOSIS — Z79811 Long term (current) use of aromatase inhibitors: Secondary | ICD-10-CM | POA: Diagnosis not present

## 2020-08-26 DIAGNOSIS — D520 Dietary folate deficiency anemia: Secondary | ICD-10-CM

## 2020-08-26 DIAGNOSIS — C50912 Malignant neoplasm of unspecified site of left female breast: Secondary | ICD-10-CM

## 2020-08-26 DIAGNOSIS — Z1231 Encounter for screening mammogram for malignant neoplasm of breast: Secondary | ICD-10-CM

## 2020-08-26 LAB — CBC WITH DIFFERENTIAL/PLATELET
Abs Immature Granulocytes: 0.03 10*3/uL (ref 0.00–0.07)
Basophils Absolute: 0.1 10*3/uL (ref 0.0–0.1)
Basophils Relative: 1 %
Eosinophils Absolute: 0.3 10*3/uL (ref 0.0–0.5)
Eosinophils Relative: 4 %
HCT: 32.9 % — ABNORMAL LOW (ref 36.0–46.0)
Hemoglobin: 10.4 g/dL — ABNORMAL LOW (ref 12.0–15.0)
Immature Granulocytes: 1 %
Lymphocytes Relative: 16 %
Lymphs Abs: 1 10*3/uL (ref 0.7–4.0)
MCH: 31.6 pg (ref 26.0–34.0)
MCHC: 31.6 g/dL (ref 30.0–36.0)
MCV: 100 fL (ref 80.0–100.0)
Monocytes Absolute: 0.6 10*3/uL (ref 0.1–1.0)
Monocytes Relative: 11 %
Neutro Abs: 4 10*3/uL (ref 1.7–7.7)
Neutrophils Relative %: 67 %
Platelets: 168 10*3/uL (ref 150–400)
RBC: 3.29 MIL/uL — ABNORMAL LOW (ref 3.87–5.11)
RDW: 14.6 % (ref 11.5–15.5)
WBC: 6 10*3/uL (ref 4.0–10.5)
nRBC: 0 % (ref 0.0–0.2)

## 2020-08-26 LAB — FERRITIN: Ferritin: 55 ng/mL (ref 11–307)

## 2020-08-26 LAB — COMPREHENSIVE METABOLIC PANEL
ALT: 25 U/L (ref 0–44)
AST: 28 U/L (ref 15–41)
Albumin: 4 g/dL (ref 3.5–5.0)
Alkaline Phosphatase: 66 U/L (ref 38–126)
Anion gap: 8 (ref 5–15)
BUN: 24 mg/dL — ABNORMAL HIGH (ref 8–23)
CO2: 27 mmol/L (ref 22–32)
Calcium: 9.9 mg/dL (ref 8.9–10.3)
Chloride: 102 mmol/L (ref 98–111)
Creatinine, Ser: 1.1 mg/dL — ABNORMAL HIGH (ref 0.44–1.00)
GFR, Estimated: 49 mL/min — ABNORMAL LOW (ref >60)
Glucose, Bld: 116 mg/dL — ABNORMAL HIGH (ref 70–99)
Potassium: 4.6 mmol/L (ref 3.5–5.1)
Sodium: 137 mmol/L (ref 135–145)
Total Bilirubin: 0.4 mg/dL (ref 0.3–1.2)
Total Protein: 6.9 g/dL (ref 6.5–8.1)

## 2020-08-26 LAB — IRON AND TIBC
Iron: 50 ug/dL (ref 28–170)
Saturation Ratios: 15 % (ref 10.4–31.8)
TIBC: 326 ug/dL (ref 250–450)
UIBC: 276 ug/dL

## 2020-08-26 LAB — FOLATE: Folate: 41 ng/mL (ref >5.9)

## 2020-08-26 LAB — VITAMIN B12: Vitamin B-12: 699 pg/mL (ref 180–914)

## 2020-08-26 LAB — VITAMIN D 25 HYDROXY (VIT D DEFICIENCY, FRACTURES): Vit D, 25-Hydroxy: 64.41 ng/mL (ref 30–100)

## 2020-08-26 NOTE — Patient Instructions (Addendum)
[facility] at [facility] Discharge Instructions  You were seen today by Dr. Delton Coombes. He went over your recent results. You will be scheduled for a mammogram in December 2022. Purchase iron tablets over the counter and take 325 mg daily. These tablets may constipate you, so purchase a stool softener as well to be taken with the iron tablets. Dr. Delton Coombes will see you back in 6 months for labs and follow up.   Thank you for choosing [facility] at [facility] to provide your oncology and hematology care.  To afford each patient quality time with our provider, please arrive at least 15 minutes before your scheduled appointment time.   If you have a lab appointment with the [facility] please come in thru the Main Entrance and check in at the main information desk  You need to re-schedule your appointment should you arrive 10 or more minutes late.  We strive to give you quality time with our providers, and arriving late affects you and other patients whose appointments are after yours.  Also, if you no show three or more times for appointments you may be dismissed from the clinic at the providers discretion.     Again, thank you for choosing [facility].  Our hope is that these requests will decrease the amount of time that you wait before being seen by our physicians.       _____________________________________________________________  Should you have questions after your visit to [facility], please contact our office at [phone] between the hours of 8:00 a.m. and 4:30 p.m.  Voicemails left after 4:00 p.m. will not be returned until the following business day.  For prescription refill requests, have your pharmacy contact our office and allow 72 hours.    Cancer Center Support Programs:   > Cancer Support Group  2nd Tuesday of the month 1pm-2pm, Journey Room

## 2020-09-12 ENCOUNTER — Ambulatory Visit (HOSPITAL_COMMUNITY): Payer: Medicare Other | Admitting: Hematology and Oncology

## 2020-09-13 ENCOUNTER — Encounter (HOSPITAL_COMMUNITY): Payer: Self-pay | Admitting: Hematology and Oncology

## 2020-09-13 ENCOUNTER — Other Ambulatory Visit: Payer: Self-pay

## 2020-09-13 ENCOUNTER — Telehealth: Payer: Self-pay | Admitting: Hematology and Oncology

## 2020-09-13 ENCOUNTER — Inpatient Hospital Stay (HOSPITAL_COMMUNITY): Payer: Medicare Other | Attending: Hematology and Oncology | Admitting: Hematology and Oncology

## 2020-09-13 DIAGNOSIS — R630 Anorexia: Secondary | ICD-10-CM | POA: Insufficient documentation

## 2020-09-13 DIAGNOSIS — Z853 Personal history of malignant neoplasm of breast: Secondary | ICD-10-CM | POA: Diagnosis not present

## 2020-09-13 DIAGNOSIS — I10 Essential (primary) hypertension: Secondary | ICD-10-CM | POA: Insufficient documentation

## 2020-09-13 DIAGNOSIS — R634 Abnormal weight loss: Secondary | ICD-10-CM | POA: Insufficient documentation

## 2020-09-13 DIAGNOSIS — R911 Solitary pulmonary nodule: Secondary | ICD-10-CM | POA: Diagnosis not present

## 2020-09-13 DIAGNOSIS — E119 Type 2 diabetes mellitus without complications: Secondary | ICD-10-CM | POA: Diagnosis not present

## 2020-09-13 DIAGNOSIS — C184 Malignant neoplasm of transverse colon: Secondary | ICD-10-CM | POA: Insufficient documentation

## 2020-09-13 DIAGNOSIS — Z87891 Personal history of nicotine dependence: Secondary | ICD-10-CM | POA: Diagnosis not present

## 2020-09-13 NOTE — Progress Notes (Signed)
Caris testing requested per Dr. Chryl Heck

## 2020-09-13 NOTE — Telephone Encounter (Signed)
Scheduled per 06/30 scheduled message, patient has been called and notified of 07/07 appointment.

## 2020-09-13 NOTE — Progress Notes (Signed)
[facility] CONSULT NOTE  Patient Care Team: Dhivianathan, Candida Peeling, MD as PCP - General (Family Medicine)  CHIEF COMPLAINTS/PURPOSE OF CONSULTATION:  Adenocarcinoma colon  ASSESSMENT & PLAN:   This is a very pleasant 87 year old female patient with type 2 diabetes mellitus left breast carcinoma in 2012, hypertension referred to hematology and medical oncology again for new diagnosis of adenocarcinoma of the transverse colon.  She presented to emergency room in [place] with chief complaint of abdominal pain and 1 episode of melena.  She was transferred to [facility], had further imaging and colonoscopy which suggested presence of single mass measuring 3 cm in the hepatic flexure covering the whole circumference, unable to advance beyond this lesion.  CT imaging suggest metastatic multiple ill-defined lesions in the liver as well as multiple mesenteric soft tissue nodules concerning for metastatic eccentric disease.  She is here for recommendations. I did discuss with the patient that this is likely advanced adenocarcinoma of the colon and the intent of treatment is palliative.  She most likely will need chemotherapy however the important question I have is if she needs surgery urgently to avoid complete obstruction of the colon.  We will try to present her in the tumor board as well coming week for recommendations.  If she does not meet criteria for immediate surgery after surgical evaluation, we will proceed with chemotherapy, dose modified FOLFOX and we have also sent Caris testing for RAS, BRAF mutations and had to amplifications.  She is MSI stable. She would like to think about chemotherapy which she is amenable to all the referrals and recommendations.  We have briefly discussed that chemotherapy can be toxic at this age and can cause adverse effects including but not limited to fatigue, nausea, vomiting, diarrhea, increased risk of infections, neuropathy, cold hypersensitivity,  coronary vasospasm etc.  She understands that some of the side effects of chemotherapy can be fatal.  She can continue on the antiestrogen therapy for her breast cancer.  She was recently seen by Dr. Delton Coombes for her invasive ductal carcinoma of the left breast, deferred breast exam today.  She will continue letrozole until the end of July.  She had a mammogram in December 2022 which was BI-RADS Category 1. She will return to clinic in 1 week to review recommendations. Urgent referral will be sent to colorectal surgery team.  HISTORY OF PRESENTING ILLNESS:   Stacy [name] 87 y.o. female is here because of Adenocarcinoma of transverse colon  This is a very pleasant 87 yr old female patient with type II DM, left breast carcinoma, HTN, dyslipidemia referred to oncology for new diagnosis of adenoca of transverse colon. She complained of abdominal pain in RLQ, a month ago, went to [facility] ED, thought to have diverticulitis, had abx.  She hasn't had any trouble having bowel movements but has noticed one episode of black water. She complains of loss of appetite, 20 lbs weight loss in 1 month. Ct imaging at [facility] showed narrowing of the ascending colon in the region of hepatic flexure less apparent on today's exam but bowel more decompressed in this area.  Mild persistent surrounding stranding and multiple prominent soft tissue nodules within the mesentery.  Given the reported history findings are concerning for metastatic mesenteric adenopathy.  Multiple ill-defined lesions throughout the liver also concerning for metastatic neoplastic disease.  Indeterminate 11 mm right upper lobe solid pulmonary nodule.  Metastatic disease in the differential.  Pancreatic ductal dilatation measuring up to 6 mm.  Colonoscopy showed a single mass measuring 3 cm in the hepatic flexure covering the whole circumference, no bleeding was identified.  Unable to advance beyond this lesion. He is now referred to  Medical Oncology for further recommendations. She now denies any abdominal pain, difficulty having a bowel movement, using stool softener daily No more hematochezia or melena Rest of the pertinent 10 point ROS reviewed and neg   MEDICAL HISTORY:  Past Medical History:  Diagnosis Date   Anemia    Anxiety    Cancer ([place])    Depression    Diabetes mellitus type II    Fracture of hand 09/29/12   fell and fractured right hand   Hyperlipemia    Hypertension    Invasive ductal carcinoma of left breast ([place]) 10/03/2010   Normocytic normochromic anemia 06/23/2016    SURGICAL HISTORY: Past Surgical History:  Procedure Laterality Date   APPENDECTOMY     CATARACT EXTRACTION W/ INTRAOCULAR LENS IMPLANT  08/26/10   Right; [facility]   CATARACT EXTRACTION W/PHACO  10/14/2010   Procedure: CATARACT EXTRACTION PHACO AND INTRAOCULAR LENS PLACEMENT ([place]);  Surgeon: Elta Guadeloupe T. [facility];  Location: [facility];  Service: Ophthalmology;  Laterality: Left;  CDE:  8.79   INCISIONAL BREAST BIOPSY  09/05/10   Left, [facility], [place]   MASTECTOMY     YAG LASER APPLICATION Left 8/50/2774   Procedure: YAG LASER APPLICATION;  Surgeon: Rutherford Guys, MD;  Location: [facility];  Service: Ophthalmology;  Laterality: Left;    SOCIAL HISTORY: Social History   Socioeconomic History   Marital status: Widowed    Spouse name: Not on file   Number of children: Not on file   Years of education: Not on file   Highest education level: Not on file  Occupational History   Not on file  Tobacco Use   Smoking status: Former    Pack years: 0.00   Smokeless tobacco: Never  Vaping Use   Vaping Use: Not on file  Substance and Sexual Activity   Alcohol use: No   Drug use: No   Sexual activity: Not Currently  Other Topics Concern   Not on file  Social History Narrative   Not on file   Social Determinants of Health   Financial Resource Strain: Low Risk    Difficulty of Paying Living Expenses: Not very hard  Food Insecurity: No  Food Insecurity   Worried About Running Out of Food in the Last Year: Never true   Ran Out of Food in the Last Year: Never true  Transportation Needs: No Transportation Needs   Lack of Transportation (Medical): No   Lack of Transportation (Non-Medical): No  Physical Activity: Insufficiently Active   Days of Exercise per Week: 5 days   Minutes of Exercise per Session: 10 min  Stress: No Stress Concern Present   Feeling of Stress : Not at all  Social Connections: Moderately Isolated   Frequency of Communication with Friends and Family: More than three times a week   Frequency of Social Gatherings with Friends and Family: More than three times a week   Attends Religious Services: More than 4 times per year   Active Member of [employer] or Organizations: No   Attends Archivist Meetings: Never   Marital Status: Widowed  Human resources officer Violence: Not At Risk   Fear of Current or Ex-Partner: No   Emotionally Abused: No   Physically Abused: No   Sexually Abused: No    FAMILY HISTORY: Family History  Problem Relation Age of Onset   Anesthesia problems Neg Hx     ALLERGIES:  is allergic to ciprofloxacin, ferrous sulfate, sulfa antibiotics, and sulfa drugs cross reactors.  MEDICATIONS:  Current Outpatient Medications  Medication Sig Dispense Refill   acetaminophen (TYLENOL) 500 MG tablet Take by mouth.     atorvastatin (LIPITOR) 20 MG tablet Take 1 tablet by mouth daily.     Calcium Carbonate-Vitamin D 600-400 MG-UNIT tablet Take 1 tablet by mouth daily.       cholecalciferol (VITAMIN D) 1000 UNITS tablet Take 1,000 Units by mouth daily.       cyanocobalamin 1000 MCG tablet Take by mouth.     escitalopram (LEXAPRO) 10 MG tablet Take 1 tablet (10 mg total) by mouth daily. 90 tablet 1   ferrous sulfate 325 (65 FE) MG EC tablet Take 325 mg by mouth daily with breakfast.     gabapentin (NEURONTIN) 100 MG capsule Take 100 mg by mouth 3 (three) times daily.   2   letrozole (FEMARA)  2.5 MG tablet Take 1 tablet (2.5 mg total) by mouth daily. 90 tablet 3   lisinopril (PRINIVIL,ZESTRIL) 20 MG tablet Take 1 tablet by mouth every evening.     metFORMIN (GLUCOPHAGE) 500 MG tablet Take 1,000 mg by mouth every evening.      Multiple Vitamin (MULTIVITAMIN) tablet Take 1 tablet by mouth daily.       No current facility-administered medications for this visit.     PHYSICAL EXAMINATION: ECOG PERFORMANCE STATUS: 0 - Asymptomatic  There were no vitals filed for this visit. There were no vitals filed for this visit.  GENERAL:alert, no distress and comfortable SKIN: skin color, texture, turgor are normal, no rashes or significant lesions EYES: normal, conjunctiva are pink and non-injected, sclera clear OROPHARYNX:no exudate, no erythema and lips, buccal mucosa, and tongue normal  NECK: supple, thyroid normal size, non-tender, without nodularity LYMPH:  no palpable lymphadenopathy in the cervical, axillary or inguinal LUNGS: clear to auscultation and percussion with normal breathing effort HEART: regular rate & rhythm and no murmurs and no lower extremity edema ABDOMEN:abdomen soft, non-tender and normal bowel sounds Musculoskeletal:no cyanosis of digits and no clubbing  PSYCH: alert & oriented x 3 with fluent speech NEURO: no focal motor/sensory deficits  LABORATORY DATA:  I have reviewed the data as listed Lab Results  Component Value Date   WBC 6.0 08/26/2020   HGB 10.4 (L) 08/26/2020   HCT 32.9 (L) 08/26/2020   MCV 100.0 08/26/2020   PLT 168 08/26/2020     Chemistry      Component Value Date/Time   NA 137 08/26/2020 1301   K 4.6 08/26/2020 1301   CL 102 08/26/2020 1301   CO2 27 08/26/2020 1301   BUN 24 (H) 08/26/2020 1301   CREATININE 1.10 (H) 08/26/2020 1301      Component Value Date/Time   CALCIUM 9.9 08/26/2020 1301   ALKPHOS 66 08/26/2020 1301   AST 28 08/26/2020 1301   ALT 25 08/26/2020 1301   BILITOT 0.4 08/26/2020 1301       RADIOGRAPHIC  STUDIES: I have personally reviewed the radiological images as listed and agreed with the findings in the report. No results found.  All questions were answered. The patient knows to call the clinic with any problems, questions or concerns. I spent 60 minutes in the care of this patient including H and P, review of records, counseling and coordination of care.     Benay Pike, MD 09/13/2020  9:38 AM

## 2020-09-17 ENCOUNTER — Encounter (HOSPITAL_COMMUNITY): Payer: Self-pay | Admitting: Lab

## 2020-09-17 ENCOUNTER — Ambulatory Visit
Admission: RE | Admit: 2020-09-17 | Discharge: 2020-09-17 | Disposition: A | Discharge status: Home / Self Care | Payer: Self-pay | Source: Ambulatory Visit | Attending: Hematology and Oncology | Admitting: Hematology and Oncology

## 2020-09-17 ENCOUNTER — Other Ambulatory Visit: Payer: Self-pay

## 2020-09-17 DIAGNOSIS — C184 Malignant neoplasm of transverse colon: Secondary | ICD-10-CM

## 2020-09-17 NOTE — Progress Notes (Unsigned)
Referral sent to [facility].  Records faxed on 7/5

## 2020-09-18 ENCOUNTER — Other Ambulatory Visit: Payer: Self-pay

## 2020-09-18 DIAGNOSIS — C184 Malignant neoplasm of transverse colon: Secondary | ICD-10-CM

## 2020-09-18 NOTE — Progress Notes (Signed)
The proposed treatment discussed in conference is for discussion purpose only and is not a binding recommendation.  The patients have not been physically examined, or presented with their treatment options.  Therefore, final treatment plans cannot be decided.  

## 2020-09-19 ENCOUNTER — Encounter: Payer: Self-pay | Admitting: Hematology and Oncology

## 2020-09-19 ENCOUNTER — Other Ambulatory Visit: Payer: Self-pay

## 2020-09-19 ENCOUNTER — Inpatient Hospital Stay: Payer: Medicare Other | Attending: Hematology and Oncology | Admitting: Hematology and Oncology

## 2020-09-19 VITALS — BP 125/63 | HR 76 | Temp 98.9°F | Resp 19 | Ht 60.3 in | Wt 185.5 lb

## 2020-09-19 DIAGNOSIS — C184 Malignant neoplasm of transverse colon: Secondary | ICD-10-CM | POA: Insufficient documentation

## 2020-09-19 DIAGNOSIS — Z853 Personal history of malignant neoplasm of breast: Secondary | ICD-10-CM | POA: Insufficient documentation

## 2020-09-19 DIAGNOSIS — Z79899 Other long term (current) drug therapy: Secondary | ICD-10-CM | POA: Diagnosis not present

## 2020-09-19 NOTE — Progress Notes (Signed)
[name] CONSULT NOTE  Patient Care Team: Stacy, Candida Peeling, MD as PCP - General (Family Medicine)  CHIEF COMPLAINTS/PURPOSE OF CONSULTATION:  Adenocarcinoma colon  ASSESSMENT & PLAN:   This is a very pleasant 87 year old female patient with type 2 diabetes mellitus left breast carcinoma in 2012, hypertension referred to hematology and medical oncology again for new diagnosis of adenocarcinoma of the transverse colon.  She presented to emergency room in [place] with chief complaint of abdominal pain and 1 episode of melena.   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  She is here for follow-up with her daughter.  Since last visit, she denies any new abdominal complaints, takes stool softener daily and has denied any more melena or abdominal pain or inability to pass stool.  We have discussed about [name] in the GI tumor board and the recommendation was to consider PET/CT and proceed with chemotherapy frontline and surgery as needed. Have discussed these recommendations with patient and her daughter today.  We have also discussed about possible chemotherapy regimen which would be FOLFOX or [place] ox for her every 2 or 3 weeks respectively depending on the choice.  We have discussed about mechanism of action, adverse side effects of chemotherapy including but not limited to fatigue, nausea, vomiting, diarrhea, increased risk of infections, coronary vasospasm, cold hypersensitivity, neuropathy.  She understands that some of the side effects can be life-threatening.  She we have discussed about median overall survival with metastatic colorectal cancer which can vary from 2 to 3 years.  We have discussed about possibly adding in radiotherapy depending on her Caris testing results. She would like to proceed with a PET/CT and think about the role of chemotherapy and return to  clinic for follow-up on July 22 for further discussion.  She may also proceed with no treatment since she has a very reasonable quality of life.  In this case, we have discussed that we can engage hospice when her performance status deteriorates.  HISTORY OF PRESENTING ILLNESS:   Stacy [name] 87 y.o. female is here because of Adenocarcinoma of transverse colon  This is a very pleasant 87 yr old female patient with type II DM, left breast carcinoma, HTN, dyslipidemia referred to oncology for new diagnosis of adenoca of transverse colon.  Interval history  She is here for follow-up with her daughter.  She denies any abdominal pain or episodes of melena.  She has been using a daily stool softener and has been able to have a bowel movement.  She feels well overall and lives independently.  Rest of the pertinent review of systems reviewed and negative.  MEDICAL HISTORY:  Past Medical History:  Diagnosis Date   Anemia    Anxiety    Cancer ([place])    Depression    Diabetes mellitus type II    Fracture of hand 09/29/12   fell and fractured right hand   Hyperlipemia    Hypertension    Invasive ductal carcinoma of left breast ([place]) 10/03/2010   Normocytic normochromic anemia 06/23/2016    SURGICAL HISTORY: Past Surgical History:  Procedure Laterality Date   APPENDECTOMY     CATARACT EXTRACTION W/ INTRAOCULAR LENS IMPLANT  08/26/10   Right; [facility]   CATARACT EXTRACTION W/PHACO  10/14/2010   Procedure: CATARACT EXTRACTION PHACO AND INTRAOCULAR LENS PLACEMENT ([place]);  Surgeon: Elta Guadeloupe T. [facility];  Location: [facility];  Service: Ophthalmology;  Laterality: Left;  CDE:  8.79   INCISIONAL BREAST BIOPSY  09/05/10   Left, [facility], [facility]   MASTECTOMY     YAG LASER APPLICATION Left 1/82/9937   Procedure: YAG LASER APPLICATION;  Surgeon: Rutherford Guys, MD;  Location: [facility];  Service: Ophthalmology;  Laterality: Left;    SOCIAL HISTORY: Social History   Socioeconomic History   Marital status: Widowed     Spouse name: Not on file   Number of children: Not on file   Years of education: Not on file   Highest education level: Not on file  Occupational History   Not on file  Tobacco Use   Smoking status: Former    Pack years: 0.00   Smokeless tobacco: Never  Vaping Use   Vaping Use: Not on file  Substance and Sexual Activity   Alcohol use: No   Drug use: No   Sexual activity: Not Currently  Other Topics Concern   Not on file  Social History Narrative   Not on file   Social Determinants of Health   Financial Resource Strain: Low Risk    Difficulty of Paying Living Expenses: Not very hard  Food Insecurity: No Food Insecurity   Worried About Running Out of Food in the Last Year: Never true   Ran Out of Food in the Last Year: Never true  Transportation Needs: No Transportation Needs   Lack of Transportation (Medical): No   Lack of Transportation (Non-Medical): No  Physical Activity: Insufficiently Active   Days of Exercise per Week: 5 days   Minutes of Exercise per Session: 10 min  Stress: No Stress Concern Present   Feeling of Stress : Not at all  Social Connections: Moderately Isolated   Frequency of Communication with Friends and Family: More than three times a week   Frequency of Social Gatherings with Friends and Family: More than three times a week   Attends Religious Services: More than 4 times per year   Active Member of [employer] or Organizations: No   Attends Archivist Meetings: Never   Marital Status: Widowed  Human resources officer Violence: Not At Risk   Fear of Current or Ex-Partner: No   Emotionally Abused: No   Physically Abused: No   Sexually Abused: No    FAMILY HISTORY: Family History  Problem Relation Age of Onset   Anesthesia problems Neg Hx     ALLERGIES:  is allergic to ciprofloxacin, ferrous sulfate, sulfa antibiotics, and sulfa drugs cross reactors.  MEDICATIONS:  Current Outpatient Medications  Medication Sig Dispense Refill    acetaminophen (TYLENOL) 500 MG tablet Take by mouth.     atorvastatin (LIPITOR) 20 MG tablet Take 1 tablet by mouth daily.     Calcium Carbonate-Vitamin D 600-400 MG-UNIT tablet Take 1 tablet by mouth daily.       cholecalciferol (VITAMIN D) 1000 UNITS tablet Take 1,000 Units by mouth daily.       cyanocobalamin 1000 MCG tablet Take by mouth.     escitalopram (LEXAPRO) 10 MG tablet Take 1 tablet (10 mg total) by mouth daily. 90 tablet 1   ferrous sulfate 325 (65 FE) MG EC tablet Take 325 mg by mouth daily with breakfast.     gabapentin (NEURONTIN) 100 MG capsule Take 100 mg by mouth 3 (three) times daily.   2   letrozole (FEMARA) 2.5 MG tablet Take 1 tablet (2.5 mg total) by mouth daily. 90 tablet 3   lisinopril (PRINIVIL,ZESTRIL) 20 MG tablet  Take 1 tablet by mouth every evening.     metFORMIN (GLUCOPHAGE) 500 MG tablet Take 1,000 mg by mouth every evening.      Multiple Vitamin (MULTIVITAMIN) tablet Take 1 tablet by mouth daily.       No current facility-administered medications for this visit.     PHYSICAL EXAMINATION: ECOG PERFORMANCE STATUS: 0 - Asymptomatic  Vitals:   09/19/20 1346  BP: 125/63  Pulse: 76  Resp: 19  Temp: 98.9 F (37.2 C)  SpO2: 97%   Filed Weights   09/19/20 1346  Weight: 185 lb 8 oz (84.1 kg)    Physical exam deferred today in lieu of counseling  LABORATORY DATA:  I have reviewed the data as listed Lab Results  Component Value Date   WBC 6.0 08/26/2020   HGB 10.4 (L) 08/26/2020   HCT 32.9 (L) 08/26/2020   MCV 100.0 08/26/2020   PLT 168 08/26/2020     Chemistry      Component Value Date/Time   NA 137 08/26/2020 1301   K 4.6 08/26/2020 1301   CL 102 08/26/2020 1301   CO2 27 08/26/2020 1301   BUN 24 (H) 08/26/2020 1301   CREATININE 1.10 (H) 08/26/2020 1301      Component Value Date/Time   CALCIUM 9.9 08/26/2020 1301   ALKPHOS 66 08/26/2020 1301   AST 28 08/26/2020 1301   ALT 25 08/26/2020 1301   BILITOT 0.4 08/26/2020 1301        RADIOGRAPHIC STUDIES: I have personally reviewed the radiological images as listed and agreed with the findings in the report.  No results found.  All questions were answered. The patient knows to call the clinic with any problems, questions or concerns. I spent 30 minutes in the care of this patient including H and P, review of records, counseling and coordination of care. We have discussed about tumor board recommendations, chemotherapy, prognosis, adverse effects of chemotherapy and follow-up recommendations.    Benay Pike, MD 09/19/2020 6:22 PM

## 2020-10-03 ENCOUNTER — Other Ambulatory Visit: Payer: Self-pay

## 2020-10-03 ENCOUNTER — Encounter (HOSPITAL_COMMUNITY)
Admission: RE | Admit: 2020-10-03 | Discharge: 2020-10-03 | Disposition: A | Discharge status: Home / Self Care | Payer: Medicare Other | Source: Ambulatory Visit | Attending: Hematology and Oncology | Admitting: Hematology and Oncology

## 2020-10-03 DIAGNOSIS — R911 Solitary pulmonary nodule: Secondary | ICD-10-CM | POA: Diagnosis not present

## 2020-10-03 DIAGNOSIS — C787 Secondary malignant neoplasm of liver and intrahepatic bile duct: Secondary | ICD-10-CM | POA: Insufficient documentation

## 2020-10-03 DIAGNOSIS — C184 Malignant neoplasm of transverse colon: Secondary | ICD-10-CM | POA: Diagnosis present

## 2020-10-03 LAB — GLUCOSE, CAPILLARY: Glucose-Capillary: 96 mg/dL (ref 70–99)

## 2020-10-03 MED ORDER — FLUDEOXYGLUCOSE F - 18 (FDG) INJECTION
9.2000 | Freq: Once | INTRAVENOUS | Status: AC
  Administered 2020-10-03: 9.26 via INTRAVENOUS

## 2020-10-04 ENCOUNTER — Ambulatory Visit (HOSPITAL_COMMUNITY): Payer: Medicare Other | Admitting: Hematology and Oncology

## 2020-10-11 ENCOUNTER — Inpatient Hospital Stay (HOSPITAL_BASED_OUTPATIENT_CLINIC_OR_DEPARTMENT_OTHER): Payer: Medicare Other | Admitting: Hematology and Oncology

## 2020-10-11 ENCOUNTER — Encounter (HOSPITAL_COMMUNITY): Payer: Self-pay | Admitting: Hematology and Oncology

## 2020-10-11 ENCOUNTER — Other Ambulatory Visit: Payer: Self-pay

## 2020-10-11 VITALS — BP 126/60 | HR 81 | Temp 98.0°F | Resp 18 | Wt 187.0 lb

## 2020-10-11 DIAGNOSIS — C184 Malignant neoplasm of transverse colon: Secondary | ICD-10-CM

## 2020-10-11 NOTE — Progress Notes (Signed)
[name] CONSULT NOTE  Patient Care Team: Dhivianathan, Candida Peeling, MD as PCP - General (Family Medicine)  CHIEF COMPLAINTS/PURPOSE OF CONSULTATION:  Adenocarcinoma colon  ASSESSMENT & PLAN:   This is a very pleasant 87 year old female patient with type 2 diabetes mellitus left breast carcinoma in 2012, hypertension referred to hematology and medical oncology again for new diagnosis of adenocarcinoma of the transverse colon.  She presented to emergency room in [place] with chief complaint of abdominal pain and 1 episode of melena.   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  She is here for follow-up with her daughter after PET imaging and molecular testing results.  PET confirmed multifocal hypermetabolic hepatic metastasis, solitary hypermetabolic right upper lobe pulmonary nodule highly concerning for pulmonary metastasis and hypermetabolic thickening through the hepatic flexure of the colon suggestive of primary colorectal carcinoma.  Probable metastatic lymph node along the mesenteric border.  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  She was noted to have HER2 positivity however since this patient also harbors a mutation in RAS, we do not have data on the efficacy of her 2 targeted agents in Ras mutated colorectal cancer.  Mismatch repair status, proficient, TMB low 7 mutations per MB  I have discussed about considering FOLFOX with bevacizumab or 5-FU with bevacizumab in first-line metastatic colorectal cancer if she is worried about tolerance.  We have discussed about dose modification and monitoring.  At this time patient is not interested in pursuing any treatment since she feels well.  She understands that the cancer will continue to progress and there is risk of obstruction, bowel perforation leading to sepsis and septic shock and resulting in death.  She understands  the consequences, does not want to proceed with any treatment but if her clinical situation deteriorates, she would like to enroll in hospice at that time.  She will continue follow-up with Dr. Delton Coombes and will return to clinic in 2 months.  Daughter was encouraged to keep in touch with [place] and let us know if she changes her mind or she continues to deteriorate clinically.  Thank you for consulting [place] the care of this patient.  Please do not hesitate to contact us with any additional questions or concerns.  HISTORY OF PRESENTING ILLNESS:   Stacy [name] 87 y.o. female is here because of Adenocarcinoma of transverse colon  This is a very pleasant 87 yr old female patient with type II DM, left breast carcinoma, HTN, dyslipidemia referred to oncology for new diagnosis of adenoca of transverse colon.  Interval history  During her last visit, we have discussed about further staging and molecular testing of the metastatic adenocarcinoma of the transverse colon.  She also needed some time to think about her treatment recommendations.  She is here for follow-up with her daughter.  Since last visit she had PET imaging.  She denies any abdominal pain, constipation or difficulty having a bowel movement.  She feels very well.  She is hoping to not move forward with any chemotherapy and when her clinical situation deteriorates, she wants to proceed with hospice. Rest of the pertinent 10 point ROS reviewed and negative.  MEDICAL HISTORY:  Past Medical History:  Diagnosis Date   Anemia    Anxiety    Cancer ([place])    Depression    Diabetes mellitus type II    Fracture of hand 09/29/12   fell  and fractured right hand   Hyperlipemia    Hypertension    Invasive ductal carcinoma of left breast ([place]) 10/03/2010   Normocytic normochromic anemia 06/23/2016    SURGICAL HISTORY: Past Surgical History:  Procedure Laterality Date   APPENDECTOMY     CATARACT EXTRACTION W/ INTRAOCULAR LENS IMPLANT  08/26/10    Right; [facility]   CATARACT EXTRACTION W/PHACO  10/14/2010   Procedure: CATARACT EXTRACTION PHACO AND INTRAOCULAR LENS PLACEMENT ([place]);  Surgeon: Elta Guadeloupe T. [facility] Crane;  Location: [facility];  Service: Ophthalmology;  Laterality: Left;  CDE:  8.79   INCISIONAL BREAST BIOPSY  09/05/10   Left, [facility], [facility]   MASTECTOMY     YAG LASER APPLICATION Left 0/97/3532   Procedure: YAG LASER APPLICATION;  Surgeon: Rutherford Guys, MD;  Location: [facility];  Service: Ophthalmology;  Laterality: Left;    SOCIAL HISTORY: Social History   Socioeconomic History   Marital status: Widowed    Spouse name: Not on file   Number of children: Not on file   Years of education: Not on file   Highest education level: Not on file  Occupational History   Not on file  Tobacco Use   Smoking status: Former   Smokeless tobacco: Never  Vaping Use   Vaping Use: Not on file  Substance and Sexual Activity   Alcohol use: No   Drug use: No   Sexual activity: Not Currently  Other Topics Concern   Not on file  Social History Narrative   Not on file   Social Determinants of Health   Financial Resource Strain: Low Risk    Difficulty of Paying Living Expenses: Not very hard  Food Insecurity: No Food Insecurity   Worried About Running Out of Food in the Last Year: Never true   Ran Out of Food in the Last Year: Never true  Transportation Needs: No Transportation Needs   Lack of Transportation (Medical): No   Lack of Transportation (Non-Medical): No  Physical Activity: Insufficiently Active   Days of Exercise per Week: 5 days   Minutes of Exercise per Session: 10 min  Stress: No Stress Concern Present   Feeling of Stress : Not at all  Social Connections: Moderately Isolated   Frequency of Communication with Friends and Family: More than three times a week   Frequency of Social Gatherings with Friends and Family: More than three times a week   Attends Religious Services: More than 4 times per year   Active Member of [employer] or  Organizations: No   Attends Archivist Meetings: Never   Marital Status: Widowed  Human resources officer Violence: Not At Risk   Fear of Current or Ex-Partner: No   Emotionally Abused: No   Physically Abused: No   Sexually Abused: No    FAMILY HISTORY: Family History  Problem Relation Age of Onset   Anesthesia problems Neg Hx     ALLERGIES:  is allergic to ciprofloxacin, ferrous sulfate, sulfa antibiotics, and sulfa drugs cross reactors.  MEDICATIONS:  Current Outpatient Medications  Medication Sig Dispense Refill   acetaminophen (TYLENOL) 500 MG tablet Take by mouth.     atorvastatin (LIPITOR) 20 MG tablet Take 1 tablet by mouth daily.     Calcium Carbonate-Vitamin D 600-400 MG-UNIT tablet Take 1 tablet by mouth daily.       cholecalciferol (VITAMIN D) 1000 UNITS tablet Take 1,000 Units by mouth daily.       cyanocobalamin 1000 MCG tablet Take by mouth.  escitalopram (LEXAPRO) 10 MG tablet Take 1 tablet (10 mg total) by mouth daily. 90 tablet 1   ferrous sulfate 325 (65 FE) MG EC tablet Take 325 mg by mouth daily with breakfast.     gabapentin (NEURONTIN) 100 MG capsule Take 100 mg by mouth 3 (three) times daily.   2   letrozole (FEMARA) 2.5 MG tablet Take 1 tablet (2.5 mg total) by mouth daily. 90 tablet 3   lisinopril (PRINIVIL,ZESTRIL) 20 MG tablet Take 1 tablet by mouth every evening.     metFORMIN (GLUCOPHAGE) 500 MG tablet Take 1,000 mg by mouth every evening.      Multiple Vitamin (MULTIVITAMIN) tablet Take 1 tablet by mouth daily.       No current facility-administered medications for this visit.     PHYSICAL EXAMINATION: ECOG PERFORMANCE STATUS: 0 - Asymptomatic  Vitals:   10/11/20 0922  BP: 126/60  Pulse: 81  Resp: 18  Temp: 98 F (36.7 C)  SpO2: 96%   Filed Weights   10/11/20 0922  Weight: 187 lb (84.8 kg)    Physical exam deferred today in lieu of counseling  LABORATORY DATA:  I have reviewed the data as listed Lab Results  Component  Value Date   WBC 6.0 08/26/2020   HGB 10.4 (L) 08/26/2020   HCT 32.9 (L) 08/26/2020   MCV 100.0 08/26/2020   PLT 168 08/26/2020     Chemistry      Component Value Date/Time   NA 137 08/26/2020 1301   K 4.6 08/26/2020 1301   CL 102 08/26/2020 1301   CO2 27 08/26/2020 1301   BUN 24 (H) 08/26/2020 1301   CREATININE 1.10 (H) 08/26/2020 1301      Component Value Date/Time   CALCIUM 9.9 08/26/2020 1301   ALKPHOS 66 08/26/2020 1301   AST 28 08/26/2020 1301   ALT 25 08/26/2020 1301   BILITOT 0.4 08/26/2020 1301       RADIOGRAPHIC STUDIES: I have personally reviewed the radiological images as listed and agreed with the findings in the report.  NM PET Image Initial (PI) Skull Base To Thigh  Result Date: 10/05/2020 CLINICAL DATA:  Initial treatment strategy for colorectal carcinoma. Transverse colon adenocarcinoma. Additional history of LEFT breast cancer. EXAM: NUCLEAR MEDICINE PET SKULL BASE TO THIGH TECHNIQUE: 9.3 mCi F-18 FDG was injected intravenously. Full-ring PET imaging was performed from the skull base to thigh after the radiotracer. CT data was obtained and used for attenuation correction and anatomic localization. Fasting blood glucose: 96 mg/dl COMPARISON:  CT 08/31/2020 FINDINGS: Mediastinal blood pool activity: SUV max 2.9 Liver activity: SUV max NA NECK: No hypermetabolic lymph nodes in the neck. Incidental CT findings: none CHEST: Lobular nodule in the anterior RIGHT upper lobe measuring 10 mm (image 58) with SUV max equal 3.92. No additional pulmonary nodules. No hypermetabolic mediastinal lymph nodes. Incidental CT findings: Coronary artery calcification and aortic atherosclerotic calcification. Post LEFT mastectomy and axillary nodal dissection. ABDOMEN/PELVIS: There are several discrete foci of hypermetabolic activity within the liver with corresponding to hypointensities on noncontrast CT. For example: Lesion RIGHT hepatic lobe SUV max equal 11.0 corresponds to a hypodense  lesion measuring 2.4 cm on image 83). Lesion in the more inferior RIGHT hepatic lobe with SUV max equal 10.5 measures 2.2 cm image 96. Lesion in the LEFT hepatic lobe along the falciform ligament laterally medially with SUV max equal 6.8. This lesion is more difficult define on CT. There is intense activity throughout the small bowel and colon  which is background physiologic. Difficult to define a hypermetabolic activity in relation to the diffuse background metabolic activity; however, there is a focus of luminal narrowing and mild pericolonic inflammation with intense radiotracer activity at the level of the hepatic flexure with SUV max equal 15.4 on image 104. 8 mm lymph node along the adjacent mesenteric border (image 106/CT series 4) No hypermetabolic periportal lymph nodes. No hypermetabolic retroperitoneal or pelvic lymph nodes. Incidental CT findings: Potential lesion in the hepatic flexure described above. SKELETON: No focal hypermetabolic activity to suggest skeletal metastasis. Incidental CT findings: Injection granuloma in the posterior RIGHT subcutaneous tissue IMPRESSION: 1. Multifocal hypermetabolic hepatic metastasis. 2. Hypermetabolic thickening through the hepatic flexure of the colon suggestive of primary colorectal carcinoma. Probable metastatic lymph node along the mesenteric border. 3. Solitary hypermetabolic RIGHT upper lobe pulmonary nodule highly concerning for pulmonary metastasis. Electronically Signed   By: Suzy Bouchard M.D.   On: 10/05/2020 09:48    All questions were answered. The patient knows to call the clinic with any problems, questions or concerns. I spent 30 minutes in the care of this patient including History, review of records, counseling and coordination of care. We have discussed about PET imaging results, chemotherapy options, prognosis, hospice.   Benay Pike, MD 10/11/2020 9:50 AM

## 2020-10-14 ENCOUNTER — Other Ambulatory Visit (HOSPITAL_COMMUNITY): Payer: Self-pay

## 2020-10-14 DIAGNOSIS — C184 Malignant neoplasm of transverse colon: Secondary | ICD-10-CM

## 2020-12-09 ENCOUNTER — Inpatient Hospital Stay (HOSPITAL_COMMUNITY): Payer: Medicare Other | Attending: Hematology

## 2020-12-09 ENCOUNTER — Other Ambulatory Visit: Payer: Self-pay

## 2020-12-09 DIAGNOSIS — C184 Malignant neoplasm of transverse colon: Secondary | ICD-10-CM | POA: Diagnosis not present

## 2020-12-09 DIAGNOSIS — C50912 Malignant neoplasm of unspecified site of left female breast: Secondary | ICD-10-CM

## 2020-12-09 DIAGNOSIS — D518 Other vitamin B12 deficiency anemias: Secondary | ICD-10-CM

## 2020-12-09 DIAGNOSIS — Z853 Personal history of malignant neoplasm of breast: Secondary | ICD-10-CM | POA: Diagnosis present

## 2020-12-09 DIAGNOSIS — Z170 Estrogen receptor positive status [ER+]: Secondary | ICD-10-CM

## 2020-12-09 DIAGNOSIS — C50112 Malignant neoplasm of central portion of left female breast: Secondary | ICD-10-CM

## 2020-12-09 DIAGNOSIS — D520 Dietary folate deficiency anemia: Secondary | ICD-10-CM

## 2020-12-09 LAB — CBC WITH DIFFERENTIAL/PLATELET
Abs Immature Granulocytes: 0.03 10*3/uL (ref 0.00–0.07)
Basophils Absolute: 0.1 10*3/uL (ref 0.0–0.1)
Basophils Relative: 1 %
Eosinophils Absolute: 0.2 10*3/uL (ref 0.0–0.5)
Eosinophils Relative: 2 %
HCT: 32.8 % — ABNORMAL LOW (ref 36.0–46.0)
Hemoglobin: 10.3 g/dL — ABNORMAL LOW (ref 12.0–15.0)
Immature Granulocytes: 0 %
Lymphocytes Relative: 8 %
Lymphs Abs: 0.8 10*3/uL (ref 0.7–4.0)
MCH: 31.4 pg (ref 26.0–34.0)
MCHC: 31.4 g/dL (ref 30.0–36.0)
MCV: 100 fL (ref 80.0–100.0)
Monocytes Absolute: 1.1 10*3/uL — ABNORMAL HIGH (ref 0.1–1.0)
Monocytes Relative: 11 %
Neutro Abs: 7.6 10*3/uL (ref 1.7–7.7)
Neutrophils Relative %: 78 %
Platelets: 188 10*3/uL (ref 150–400)
RBC: 3.28 MIL/uL — ABNORMAL LOW (ref 3.87–5.11)
RDW: 14.9 % (ref 11.5–15.5)
WBC: 9.7 10*3/uL (ref 4.0–10.5)
nRBC: 0 % (ref 0.0–0.2)

## 2020-12-09 LAB — VITAMIN B12: Vitamin B-12: 1469 pg/mL — ABNORMAL HIGH (ref 180–914)

## 2020-12-09 LAB — COMPREHENSIVE METABOLIC PANEL
ALT: 24 U/L (ref 0–44)
AST: 45 U/L — ABNORMAL HIGH (ref 15–41)
Albumin: 3.7 g/dL (ref 3.5–5.0)
Alkaline Phosphatase: 98 U/L (ref 38–126)
Anion gap: 8 (ref 5–15)
BUN: 21 mg/dL (ref 8–23)
CO2: 24 mmol/L (ref 22–32)
Calcium: 9.6 mg/dL (ref 8.9–10.3)
Chloride: 102 mmol/L (ref 98–111)
Creatinine, Ser: 1.27 mg/dL — ABNORMAL HIGH (ref 0.44–1.00)
GFR, Estimated: 41 mL/min — ABNORMAL LOW (ref >60)
Glucose, Bld: 157 mg/dL — ABNORMAL HIGH (ref 70–99)
Potassium: 4.3 mmol/L (ref 3.5–5.1)
Sodium: 134 mmol/L — ABNORMAL LOW (ref 135–145)
Total Bilirubin: 0.4 mg/dL (ref 0.3–1.2)
Total Protein: 6.8 g/dL (ref 6.5–8.1)

## 2020-12-09 LAB — FERRITIN: Ferritin: 138 ng/mL (ref 11–307)

## 2020-12-09 LAB — FOLATE: Folate: 45 ng/mL (ref >5.9)

## 2020-12-09 LAB — IRON AND TIBC
Iron: 24 ug/dL — ABNORMAL LOW (ref 28–170)
Saturation Ratios: 7 % — ABNORMAL LOW (ref 10.4–31.8)
TIBC: 329 ug/dL (ref 250–450)
UIBC: 305 ug/dL

## 2020-12-09 LAB — VITAMIN D 25 HYDROXY (VIT D DEFICIENCY, FRACTURES): Vit D, 25-Hydroxy: 62.95 ng/mL (ref 30–100)

## 2020-12-10 LAB — CEA: CEA: 1385 ng/mL — ABNORMAL HIGH (ref 0.0–4.7)

## 2020-12-14 NOTE — Progress Notes (Signed)
[facility] [address], [place] [address]   Patient Care Team: Dhivianathan, Candida Peeling, MD as PCP - General (Family Medicine)  SUMMARY OF ONCOLOGIC HISTORY: Oncology History   No history exists.    CHIEF COMPLIANT: Follow-up for left breast cancer and colon cancer   INTERVAL HISTORY: [name] is a 87 y.o. female here today for follow up of her left breast cancer and colon cancer. Her last visit was on 08/26/2020.   Today she reports feeling good. Her energy levels and appetite are good, and she is able to do all of her typical home activities. She denies new pains, black stools, hematochezia, and recent weight loss. She takes stool softener daily for recurrent episodic constipation. She is not current taking iron tablets as she was unable to tolerate them.  REVIEW OF SYSTEMS:   Review of Systems  Constitutional:  Negative for appetite change and fatigue.  Gastrointestinal:  Positive for constipation (resolved with stool softener). Negative for blood in stool.  Musculoskeletal:  Negative for arthralgias and myalgias.  All other systems reviewed and are negative.  I have reviewed the past medical history, past surgical history, social history and family history with the patient and they are unchanged from previous note.   ALLERGIES:   is allergic to ciprofloxacin, ferrous sulfate, sulfa antibiotics, and sulfa drugs cross reactors.   MEDICATIONS:  Current Outpatient Medications  Medication Sig Dispense Refill   acetaminophen (TYLENOL) 500 MG tablet Take by mouth.     atorvastatin (LIPITOR) 20 MG tablet Take 1 tablet by mouth daily.     Calcium Carbonate-Vitamin D 600-400 MG-UNIT tablet Take 1 tablet by mouth daily.       cholecalciferol (VITAMIN D) 1000 UNITS tablet Take 1,000 Units by mouth daily.       cyanocobalamin 1000 MCG tablet Take by mouth.     escitalopram (LEXAPRO) 20 MG tablet Take 20 mg by mouth daily.     ferrous sulfate 325 (65 FE)  MG EC tablet Take 325 mg by mouth daily with breakfast.     gabapentin (NEURONTIN) 100 MG capsule Take 100 mg by mouth 3 (three) times daily.   2   letrozole (FEMARA) 2.5 MG tablet Take 1 tablet (2.5 mg total) by mouth daily. 90 tablet 3   lisinopril (PRINIVIL,ZESTRIL) 20 MG tablet Take 1 tablet by mouth every evening.     metFORMIN (GLUCOPHAGE) 500 MG tablet Take 1,000 mg by mouth every evening.      Multiple Vitamin (MULTIVITAMIN) tablet Take 1 tablet by mouth daily.       Current Facility-Administered Medications  Medication Dose Route Frequency Provider Last Rate Last Admin   influenza vaccine adjuvanted (FLUAD) injection 0.5 mL  0.5 mL Intramuscular Once Derek Jack, MD         PHYSICAL EXAMINATION: Performance status (ECOG): 1 - Symptomatic but completely ambulatory  Vitals:   12/16/20 1448  BP: 113/67  Pulse: 78  Resp: 16  Temp: (!) 97.3 F (36.3 C)  SpO2: 95%   Wt Readings from Last 3 Encounters:  12/16/20 186 lb 1.1 oz (84.4 kg)  10/11/20 187 lb (84.8 kg)  09/19/20 185 lb 8 oz (84.1 kg)   Physical Exam Vitals reviewed.  Constitutional:      Appearance: Normal appearance.  Cardiovascular:     Rate and Rhythm: Normal rate and regular rhythm.     Pulses: Normal pulses.     Heart sounds: Normal heart sounds.  Pulmonary:  Effort: Pulmonary effort is normal.     Breath sounds: Normal breath sounds.  Abdominal:     Palpations: Abdomen is soft. There is no hepatomegaly, splenomegaly or mass.     Tenderness: There is no abdominal tenderness.  Neurological:     General: No focal deficit present.     Mental Status: She is alert and oriented to person, place, and time.  Psychiatric:        Mood and Affect: Mood normal.        Behavior: Behavior normal.    Breast Exam Chaperone: Thana Ates     LABORATORY DATA:  I have reviewed the data as listed CMP Latest Ref Rng & Units 12/09/2020 08/26/2020 02/05/2020  Glucose 70 - 99 mg/dL 157(H) 116(H) 209(H)   BUN 8 - 23 mg/dL 21 24(H) 29(H)  Creatinine 0.44 - 1.00 mg/dL 1.27(H) 1.10(H) 1.40(H)  Sodium 135 - 145 mmol/L 134(L) 137 136  Potassium 3.5 - 5.1 mmol/L 4.3 4.6 4.4  Chloride 98 - 111 mmol/L 102 102 103  CO2 22 - 32 mmol/L _0 Calcium 8.9 - 10.3 mg/dL 9.6 9.9 9.9  Total Protein 6.5 - 8.1 g/dL 6.8 6.9 7.2  Total Bilirubin 0.3 - 1.2 mg/dL 0.4 0.4 0.3  Alkaline Phos 38 - 126 U/L 98 66 64  AST 15 - 41 U/L 45(H) 28 22  ALT 0 - 44 U/L _1 No results found for: [id number] Lab Results  Component Value Date   WBC 9.7 12/09/2020   HGB 10.3 (L) 12/09/2020   HCT 32.8 (L) 12/09/2020   MCV 100.0 12/09/2020   PLT 188 12/09/2020   NEUTROABS 7.6 12/09/2020    ASSESSMENT:  1.  Stage IIIa, invasive ductal carcinoma the left breast: - Patient was diagnosed at the beginning of 2012 with invasive ductal carcinoma. -She presented with a large mass, retraction of the nipple, 6 x 6 cm clinically with suspicious lymph nodes in the right axillary region. - She was ER/PR 100% positive, HER-2 negative, Ki-67 marker 66%. -She was treated with neoadjuvant letrozole which started 09/19/2010 for several months then continued. -She then went a left modified radical mastectomy on 01/09/2011 4 of 4 lymph nodes were positive. -She completed radiation in 2013 postoperatively -BCI not able to be performed due to her nodal status.     2.    Macrocytic anemia: - Due to CKD. -Last colonoscopy reported in April 2015.   PLAN:  1.  Stage IIIa, invasive ductal carcinoma the left breast: - She completed letrozole in July.     2.    Macrocytic anemia: - Anemia from combination of CKD, relative iron deficiency and blood loss. - Ferritin is 138 and percent saturation is 7. - She cannot tolerate iron tablet.  Hemoglobin is 10.3.  We will continue to closely monitor. - If there is any deterioration, consider parenteral iron therapy.     3.  Metastatic colon cancer to the liver and lung: - She denies any  bleeding per rectum or melena.  No obstructive symptoms. - She is taking stool softener daily.  No change in bowel habits. - Reviewed labs which showed mildly elevated AST of 45 with normal bilirubin.  CEA was elevated at 1385. - She is functioning really well and lives independently.  She is independent of all ADLs and IADLs.  She does not want any type of active treatment for metastatic colon cancer.  We will continue close monitoring. - RTC 2 months  for follow-up with repeat labs.  If her physical condition deteriorates, hospice will be recommended.   Orders placed this encounter:  No orders of the defined types were placed in this encounter.   The patient has a good understanding of the overall plan. She agrees with it. She will call with any problems that may develop before the next visit here.  Derek Jack, MD [place] [phone]   I, Thana Ates, am acting as a scribe for Dr. Derek Jack.  I, Derek Jack MD, have reviewed the above documentation for accuracy and completeness, and I agree with the above.

## 2020-12-16 ENCOUNTER — Inpatient Hospital Stay (HOSPITAL_COMMUNITY): Payer: Medicare Other | Attending: Hematology and Oncology | Admitting: Hematology

## 2020-12-16 ENCOUNTER — Other Ambulatory Visit: Payer: Self-pay

## 2020-12-16 VITALS — BP 113/67 | HR 78 | Temp 97.3°F | Resp 16 | Wt 186.1 lb

## 2020-12-16 DIAGNOSIS — C184 Malignant neoplasm of transverse colon: Secondary | ICD-10-CM

## 2020-12-16 DIAGNOSIS — C50112 Malignant neoplasm of central portion of left female breast: Secondary | ICD-10-CM | POA: Diagnosis not present

## 2020-12-16 DIAGNOSIS — Z170 Estrogen receptor positive status [ER+]: Secondary | ICD-10-CM

## 2020-12-16 DIAGNOSIS — D520 Dietary folate deficiency anemia: Secondary | ICD-10-CM

## 2020-12-16 DIAGNOSIS — C50912 Malignant neoplasm of unspecified site of left female breast: Secondary | ICD-10-CM

## 2020-12-16 DIAGNOSIS — D518 Other vitamin B12 deficiency anemias: Secondary | ICD-10-CM

## 2020-12-16 DIAGNOSIS — Z23 Encounter for immunization: Secondary | ICD-10-CM | POA: Diagnosis not present

## 2020-12-16 MED ORDER — INFLUENZA VAC A&B SA ADJ QUAD 0.5 ML IM PRSY
0.5000 mL | PREFILLED_SYRINGE | Freq: Once | INTRAMUSCULAR | Status: AC
  Administered 2020-12-16: 0.5 mL via INTRAMUSCULAR
  Filled 2020-12-16: qty 0.5

## 2020-12-16 NOTE — Patient Instructions (Addendum)
[facility] at [facility] Discharge Instructions  You were seen today by Dr. Katragadda. He went over your recent results. Dr. Katragadda will see you back in 2 months for labs and follow up.   Thank you for choosing [facility] at [facility] to provide your oncology and hematology care.  To afford each patient quality time with our provider, please arrive at least 15 minutes before your scheduled appointment time.   If you have a lab appointment with the Cancer Center please come in thru the Main Entrance and check in at the main information desk  You need to re-schedule your appointment should you arrive 10 or more minutes late.  We strive to give you quality time with our providers, and arriving late affects you and other patients whose appointments are after yours.  Also, if you no show three or more times for appointments you may be dismissed from the clinic at the providers discretion.     Again, thank you for choosing [facility].  Our hope is that these requests will decrease the amount of time that you wait before being seen by our physicians.       _____________________________________________________________  Should you have questions after your visit to [facility], please contact our office at [phone] between the hours of 8:00 a.m. and 4:30 p.m.  Voicemails left after 4:00 p.m. will not be returned until the following business day.  For prescription refill requests, have your pharmacy contact our office and allow 72 hours.    Cancer Center Support Programs:   > Cancer Support Group  2nd Tuesday of the month 1pm-2pm, Journey Room   

## 2020-12-16 NOTE — Progress Notes (Signed)
Stacy [name] presents today for injection per the provider's orders.  Fluad administration without incident; injection site WNL; see MAR for injection details.  Patient tolerated procedure well and without incident. Patient remained stable throughout visit.  No questions or complaints noted at this time. Discharged ambulatory and in stable condition with family member.

## 2020-12-19 ENCOUNTER — Ambulatory Visit (INDEPENDENT_AMBULATORY_CARE_PROVIDER_SITE_OTHER): Payer: Medicare Other | Admitting: Gastroenterology

## 2021-02-03 ENCOUNTER — Inpatient Hospital Stay (HOSPITAL_COMMUNITY): Payer: Medicare Other | Attending: Hematology

## 2021-02-03 DIAGNOSIS — C50912 Malignant neoplasm of unspecified site of left female breast: Secondary | ICD-10-CM

## 2021-02-03 DIAGNOSIS — C184 Malignant neoplasm of transverse colon: Secondary | ICD-10-CM | POA: Diagnosis not present

## 2021-02-03 DIAGNOSIS — D518 Other vitamin B12 deficiency anemias: Secondary | ICD-10-CM

## 2021-02-03 LAB — CBC WITH DIFFERENTIAL/PLATELET
Abs Immature Granulocytes: 0.05 10*3/uL (ref 0.00–0.07)
Basophils Absolute: 0.1 10*3/uL (ref 0.0–0.1)
Basophils Relative: 1 %
Eosinophils Absolute: 0.2 10*3/uL (ref 0.0–0.5)
Eosinophils Relative: 2 %
HCT: 31.3 % — ABNORMAL LOW (ref 36.0–46.0)
Hemoglobin: 10.1 g/dL — ABNORMAL LOW (ref 12.0–15.0)
Immature Granulocytes: 1 %
Lymphocytes Relative: 6 %
Lymphs Abs: 0.6 10*3/uL — ABNORMAL LOW (ref 0.7–4.0)
MCH: 30.6 pg (ref 26.0–34.0)
MCHC: 32.3 g/dL (ref 30.0–36.0)
MCV: 94.8 fL (ref 80.0–100.0)
Monocytes Absolute: 1.1 10*3/uL — ABNORMAL HIGH (ref 0.1–1.0)
Monocytes Relative: 11 %
Neutro Abs: 7.8 10*3/uL — ABNORMAL HIGH (ref 1.7–7.7)
Neutrophils Relative %: 79 %
Platelets: 196 10*3/uL (ref 150–400)
RBC: 3.3 MIL/uL — ABNORMAL LOW (ref 3.87–5.11)
RDW: 15.6 % — ABNORMAL HIGH (ref 11.5–15.5)
WBC: 9.7 10*3/uL (ref 4.0–10.5)
nRBC: 0 % (ref 0.0–0.2)

## 2021-02-03 LAB — COMPREHENSIVE METABOLIC PANEL
ALT: 53 U/L — ABNORMAL HIGH (ref 0–44)
AST: 79 U/L — ABNORMAL HIGH (ref 15–41)
Albumin: 3.5 g/dL (ref 3.5–5.0)
Alkaline Phosphatase: 196 U/L — ABNORMAL HIGH (ref 38–126)
Anion gap: 9 (ref 5–15)
BUN: 26 mg/dL — ABNORMAL HIGH (ref 8–23)
CO2: 26 mmol/L (ref 22–32)
Calcium: 10 mg/dL (ref 8.9–10.3)
Chloride: 103 mmol/L (ref 98–111)
Creatinine, Ser: 0.97 mg/dL (ref 0.44–1.00)
GFR, Estimated: 57 mL/min — ABNORMAL LOW (ref >60)
Glucose, Bld: 155 mg/dL — ABNORMAL HIGH (ref 70–99)
Potassium: 4.8 mmol/L (ref 3.5–5.1)
Sodium: 138 mmol/L (ref 135–145)
Total Bilirubin: 0.7 mg/dL (ref 0.3–1.2)
Total Protein: 6.9 g/dL (ref 6.5–8.1)

## 2021-02-03 LAB — IRON AND TIBC
Iron: 34 ug/dL (ref 28–170)
Saturation Ratios: 12 % (ref 10.4–31.8)
TIBC: 275 ug/dL (ref 250–450)
UIBC: 241 ug/dL

## 2021-02-03 LAB — FERRITIN: Ferritin: 216 ng/mL (ref 11–307)

## 2021-02-03 LAB — VITAMIN B12: Vitamin B-12: 2983 pg/mL — ABNORMAL HIGH (ref 180–914)

## 2021-02-03 LAB — FOLATE: Folate: 38.9 ng/mL (ref >5.9)

## 2021-02-04 LAB — CEA: CEA: 3088 ng/mL — ABNORMAL HIGH (ref 0.0–4.7)

## 2021-02-09 NOTE — Progress Notes (Signed)
[facility] [address] [place] [address]   Patient Care Team: Dhivianathan, Candida Peeling, MD as PCP - General (Family Medicine)  SUMMARY OF ONCOLOGIC HISTORY: Oncology History   No history exists.    CHIEF COMPLIANT: Follow-up for left breast cancer and colon cancer   INTERVAL HISTORY: [name] is a 87 y.o. female here today for follow up of her left breast cancer and colon cancer. Her last visit was on 10/03/20222.   Today she reports feeling fair. She reports increased fatigue and decreased appetite. She is no longer able to cook her own meals due to this increased fatigue, but she is able to use the restroom and shower unassisted. She reports normal BM. She has lost 7 pounds since 10/03. She denies abdominal pain.   REVIEW OF SYSTEMS:   Review of Systems  Constitutional:  Positive for appetite change (25%), fatigue (depleted) and unexpected weight change (-7 lbs).  Gastrointestinal:  Negative for abdominal pain, constipation and diarrhea.  All other systems reviewed and are negative.  I have reviewed the past medical history, past surgical history, social history and family history with the patient and they are unchanged from previous note.   ALLERGIES:   is allergic to ciprofloxacin, ferrous sulfate, sulfa antibiotics, and sulfa drugs cross reactors.   MEDICATIONS:  Current Outpatient Medications  Medication Sig Dispense Refill   acetaminophen (TYLENOL) 500 MG tablet Take by mouth.     atorvastatin (LIPITOR) 20 MG tablet Take 1 tablet by mouth daily.     Calcium Carbonate-Vitamin D 600-400 MG-UNIT tablet Take 1 tablet by mouth daily.       cholecalciferol (VITAMIN D) 1000 UNITS tablet Take 1,000 Units by mouth daily.       cyanocobalamin 1000 MCG tablet Take by mouth.     escitalopram (LEXAPRO) 20 MG tablet Take 20 mg by mouth daily.     ferrous sulfate 325 (65 FE) MG EC tablet Take 325 mg by mouth daily with breakfast.     gabapentin  (NEURONTIN) 100 MG capsule Take 100 mg by mouth 3 (three) times daily.   2   letrozole (FEMARA) 2.5 MG tablet Take 1 tablet (2.5 mg total) by mouth daily. 90 tablet 3   lisinopril (PRINIVIL,ZESTRIL) 20 MG tablet Take 1 tablet by mouth every evening.     metFORMIN (GLUCOPHAGE) 500 MG tablet Take 1,000 mg by mouth every evening.      Multiple Vitamin (MULTIVITAMIN) tablet Take 1 tablet by mouth daily.       No current facility-administered medications for this visit.     PHYSICAL EXAMINATION: Performance status (ECOG): 1 - Symptomatic but completely ambulatory  There were no vitals filed for this visit. Wt Readings from Last 3 Encounters:  12/16/20 186 lb 1.1 oz (84.4 kg)  10/11/20 187 lb (84.8 kg)  09/19/20 185 lb 8 oz (84.1 kg)   Physical Exam Vitals reviewed.  Constitutional:      Appearance: Normal appearance.  Cardiovascular:     Rate and Rhythm: Normal rate and regular rhythm.     Pulses: Normal pulses.     Heart sounds: Normal heart sounds.  Pulmonary:     Effort: Pulmonary effort is normal.     Breath sounds: Normal breath sounds.  Neurological:     General: No focal deficit present.     Mental Status: She is alert and oriented to person, place, and time.  Psychiatric:        Mood  and Affect: Mood normal.        Behavior: Behavior normal.    Breast Exam Chaperone: Thana Ates     LABORATORY DATA:  I have reviewed the data as listed CMP Latest Ref Rng & Units 02/03/2021 12/09/2020 08/26/2020  Glucose 70 - 99 mg/dL 155(H) 157(H) 116(H)  BUN 8 - 23 mg/dL 26(H) 21 24(H)  Creatinine 0.44 - 1.00 mg/dL 0.97 1.27(H) 1.10(H)  Sodium 135 - 145 mmol/L 138 134(L) 137  Potassium 3.5 - 5.1 mmol/L 4.8 4.3 4.6  Chloride 98 - 111 mmol/L 103 102 102  CO2 22 - 32 mmol/L _0 Calcium 8.9 - 10.3 mg/dL 10.0 9.6 9.9  Total Protein 6.5 - 8.1 g/dL 6.9 6.8 6.9  Total Bilirubin 0.3 - 1.2 mg/dL 0.7 0.4 0.4  Alkaline Phos 38 - 126 U/L 196(H) 98 66  AST 15 - 41 U/L 79(H) 45(H) 28   ALT 0 - 44 U/L 53(H) 24 25   No results found for: [id number] Lab Results  Component Value Date   WBC 9.7 02/03/2021   HGB 10.1 (L) 02/03/2021   HCT 31.3 (L) 02/03/2021   MCV 94.8 02/03/2021   PLT 196 02/03/2021   NEUTROABS 7.8 (H) 02/03/2021    ASSESSMENT:  1.  Stage IIIa, invasive ductal carcinoma the left breast: - Patient was diagnosed at the beginning of 2012 with invasive ductal carcinoma. -She presented with a large mass, retraction of the nipple, 6 x 6 cm clinically with suspicious lymph nodes in the right axillary region. - She was ER/PR 100% positive, HER-2 negative, Ki-67 marker 66%. -She was treated with neoadjuvant letrozole which started 09/19/2010 for several months then continued. -She then went a left modified radical mastectomy on 01/09/2011 4 of 4 lymph nodes were positive. -She completed radiation in 2013 postoperatively -BCI not able to be performed due to her nodal status.     2.    Macrocytic anemia: - Due to CKD. -Last colonoscopy reported in April 2015.   PLAN:  1.  Stage IIIa, invasive ductal carcinoma the left breast: - She has completed letrozole earlier this year.     2.    Macrocytic anemia: - Anemia from mild CKD, relative iron deficiency and blood loss. - Hemoglobin is 10.1 and stable.  Ferritin is 216 and normal O35 and folic acid.     3.  Metastatic colon cancer to the liver and lung: - She reports a decrease in energy levels.  Denies any abdominal pains. - Reviewed labs which showed elevated AST, ALT and alk phos.  Bilirubin is normal.  CEA has increased to 3088 from 1385. - We discussed about palliative care in the form of hospice.  We will make a referral to [facility] in [place]. - RTC as needed.  4.  Decreased appetite: - He lost about 9 pounds since last visit.  She reports decreased appetite. - We will start her on Megace 441m twice daily.  Breast Cancer therapy associated bone loss: I have recommended calcium, Vitamin  D and weight bearing exercises.  Orders placed this encounter:  No orders of the defined types were placed in this encounter.  Total time spent is 30 minutes with more than 50% of the time spent face-to-face discussing prognosis, palliative care/hospice option, counseling and coordination of care.  The patient has a good understanding of the overall plan. She agrees with it. She will call with any problems that may develop before the next visit here.  SDerek Jack  MD [place] [phone]   I, Thana Ates, am acting as a scribe for Dr. Derek Jack.  I, Derek Jack MD, have reviewed the above documentation for accuracy and completeness, and I agree with the above.

## 2021-02-10 ENCOUNTER — Other Ambulatory Visit (HOSPITAL_COMMUNITY): Payer: Self-pay | Admitting: Hematology

## 2021-02-10 ENCOUNTER — Inpatient Hospital Stay (HOSPITAL_COMMUNITY): Payer: Medicare Other | Attending: Hematology | Admitting: Hematology

## 2021-02-10 ENCOUNTER — Other Ambulatory Visit (HOSPITAL_COMMUNITY): Payer: Self-pay | Admitting: *Deleted

## 2021-02-10 VITALS — BP 125/75 | HR 82 | Temp 96.8°F | Resp 18 | Wt 179.0 lb

## 2021-02-10 DIAGNOSIS — R748 Abnormal levels of other serum enzymes: Secondary | ICD-10-CM | POA: Diagnosis not present

## 2021-02-10 DIAGNOSIS — C787 Secondary malignant neoplasm of liver and intrahepatic bile duct: Secondary | ICD-10-CM | POA: Diagnosis not present

## 2021-02-10 DIAGNOSIS — C184 Malignant neoplasm of transverse colon: Secondary | ICD-10-CM | POA: Diagnosis not present

## 2021-02-10 DIAGNOSIS — Z9012 Acquired absence of left breast and nipple: Secondary | ICD-10-CM | POA: Insufficient documentation

## 2021-02-10 DIAGNOSIS — N189 Chronic kidney disease, unspecified: Secondary | ICD-10-CM | POA: Insufficient documentation

## 2021-02-10 DIAGNOSIS — Z853 Personal history of malignant neoplasm of breast: Secondary | ICD-10-CM | POA: Insufficient documentation

## 2021-02-10 DIAGNOSIS — R630 Anorexia: Secondary | ICD-10-CM | POA: Diagnosis not present

## 2021-02-10 DIAGNOSIS — C7800 Secondary malignant neoplasm of unspecified lung: Secondary | ICD-10-CM | POA: Insufficient documentation

## 2021-02-10 DIAGNOSIS — D631 Anemia in chronic kidney disease: Secondary | ICD-10-CM | POA: Diagnosis not present

## 2021-02-10 DIAGNOSIS — Z170 Estrogen receptor positive status [ER+]: Secondary | ICD-10-CM

## 2021-02-10 DIAGNOSIS — R5383 Other fatigue: Secondary | ICD-10-CM | POA: Diagnosis not present

## 2021-02-10 DIAGNOSIS — C50912 Malignant neoplasm of unspecified site of left female breast: Secondary | ICD-10-CM

## 2021-02-10 DIAGNOSIS — C50112 Malignant neoplasm of central portion of left female breast: Secondary | ICD-10-CM

## 2021-02-10 MED ORDER — MEGESTROL ACETATE 400 MG/10ML PO SUSP: 400.0000 mg | Freq: Two times a day (BID) | ORAL | 0 refills | Status: DC

## 2021-02-10 MED ORDER — MISC. DEVICES MISC: 0 refills | Status: DC

## 2021-02-10 NOTE — Patient Instructions (Addendum)
[facility] at [facility] Discharge Instructions  You were seen and examined today by Dr. Delton Coombes. He reviewed your most recent labs and your liver test and tumor marker has increased. Dr. Delton Coombes discussed referral to palliative care. He sent in a prescription for medication to increase your appetite.    Thank you for choosing [facility] at [facility] to provide your oncology and hematology care.  To afford each patient quality time with our provider, please arrive at least 15 minutes before your scheduled appointment time.   If you have a lab appointment with the [facility] please come in thru the Main Entrance and check in at the main information desk.  You need to re-schedule your appointment should you arrive 10 or more minutes late.  We strive to give you quality time with our providers, and arriving late affects you and other patients whose appointments are after yours.  Also, if you no show three or more times for appointments you may be dismissed from the clinic at the providers discretion.     Again, thank you for choosing [facility].  Our hope is that these requests will decrease the amount of time that you wait before being seen by our physicians.       _____________________________________________________________  Should you have questions after your visit to [facility], please contact our office at [phone] and follow the prompts.  Our office hours are 8:00 a.m. and 4:30 p.m. Monday - Friday.  Please note that voicemails left after 4:00 p.m. may not be returned until the following business day.  We are closed weekends and major holidays.  You do have access to a nurse 24-7, just call the main number to the clinic [phone] and do not press any options, hold on the line and a nurse will answer the phone.    For prescription refill requests, have your pharmacy contact our office and allow 72  hours.    Due to Covid, you will need to wear a mask upon entering the hospital. If you do not have a mask, a mask will be given to you at the Main Entrance upon arrival. For doctor visits, patients may have 1 support person age 16 or older with them. For treatment visits, patients can not have anyone with them due to social distancing guidelines and our immunocompromised population.

## 2021-02-20 ENCOUNTER — Encounter (HOSPITAL_COMMUNITY): Payer: Self-pay | Admitting: *Deleted

## 2021-02-20 ENCOUNTER — Other Ambulatory Visit (HOSPITAL_COMMUNITY): Payer: Self-pay | Admitting: *Deleted

## 2021-02-20 NOTE — Progress Notes (Signed)
Fax and telephone call received from [facility] and [facility] requesting order to transition from palliative care to hospice.  Order, OVN and demopgraphics sent to [place], RN @ [phone] P [phone].  Dr Delton Coombes aware and is agreeable.

## 2021-03-19 ENCOUNTER — Ambulatory Visit (HOSPITAL_COMMUNITY): Payer: Medicare Other

## 2021-03-19 ENCOUNTER — Other Ambulatory Visit (HOSPITAL_COMMUNITY): Payer: Medicare Other

## 2021-03-26 ENCOUNTER — Ambulatory Visit (HOSPITAL_COMMUNITY): Payer: Medicare Other | Admitting: Hematology

## 2021-04-16 DEATH — deceased

## 2021-07-13 IMAGING — MG DIGITAL SCREENING UNILAT RIGHT W/ TOMO W/ CAD
4 series · 4 of 12 positions shown · non-contrast
Comparison: Previous exam(s).

CLINICAL DATA: Screening.

EXAM:
DIGITAL SCREENING UNILATERAL RIGHT MAMMOGRAM WITH CAD AND TOMO

[R CC synth-2D]
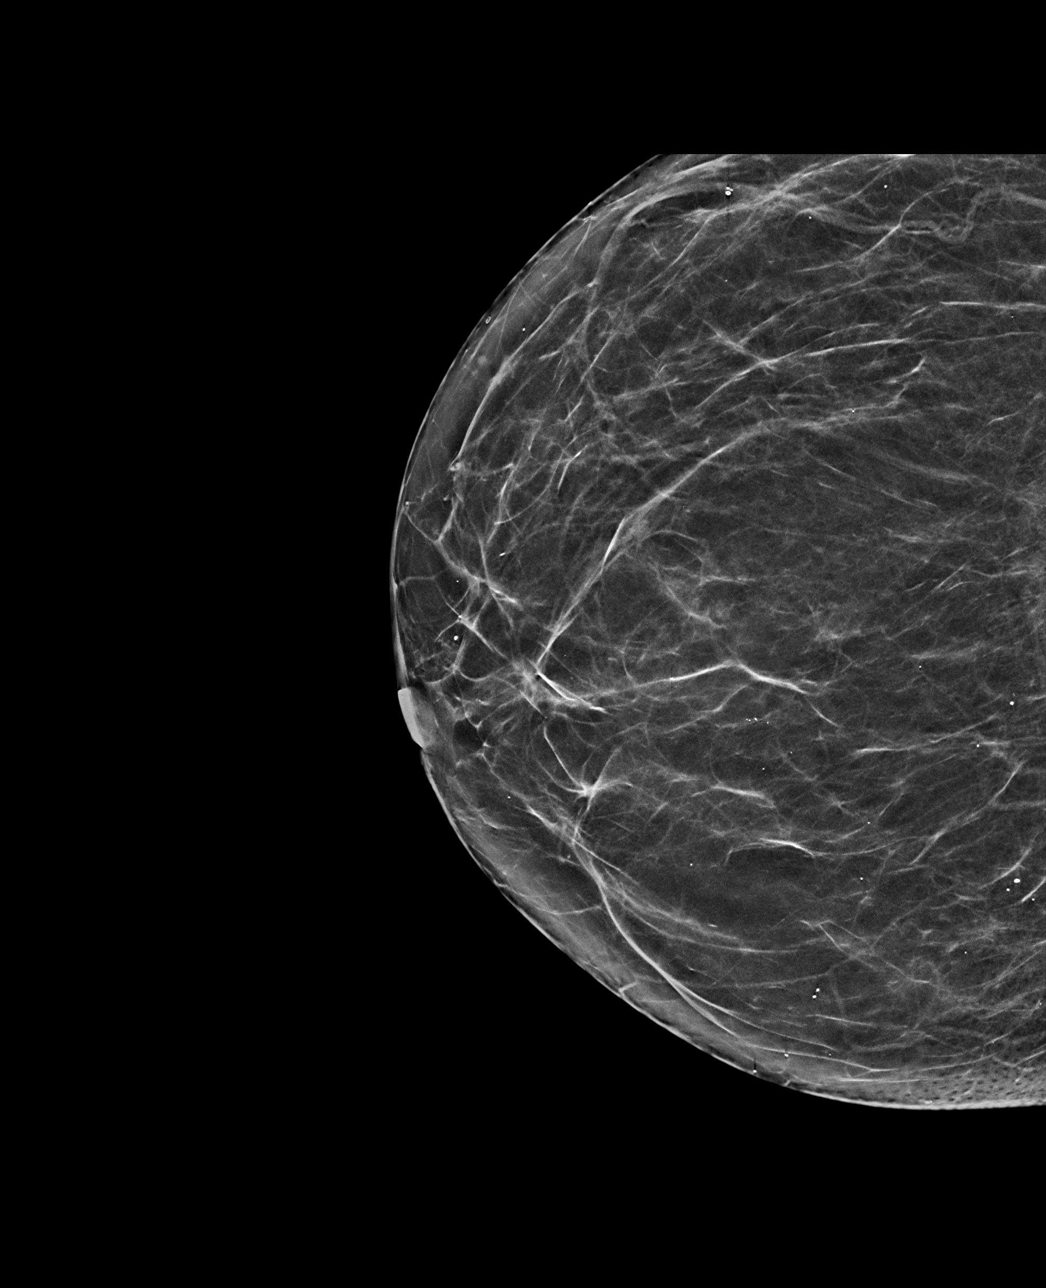

[R MLO synth-2D]
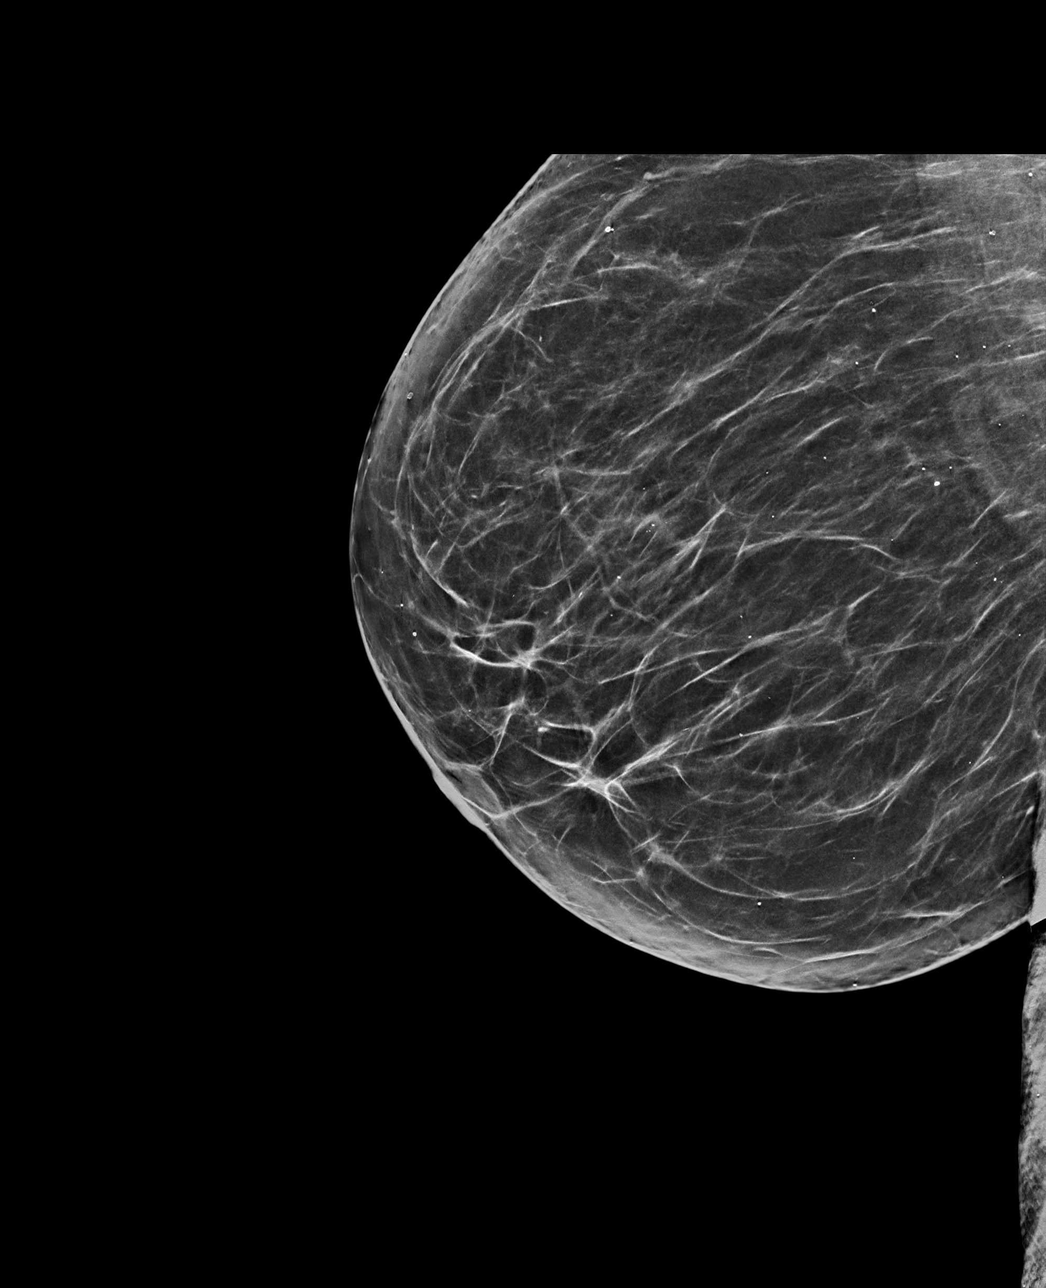

[R MLO tomo · tomo slice 35/70.0]
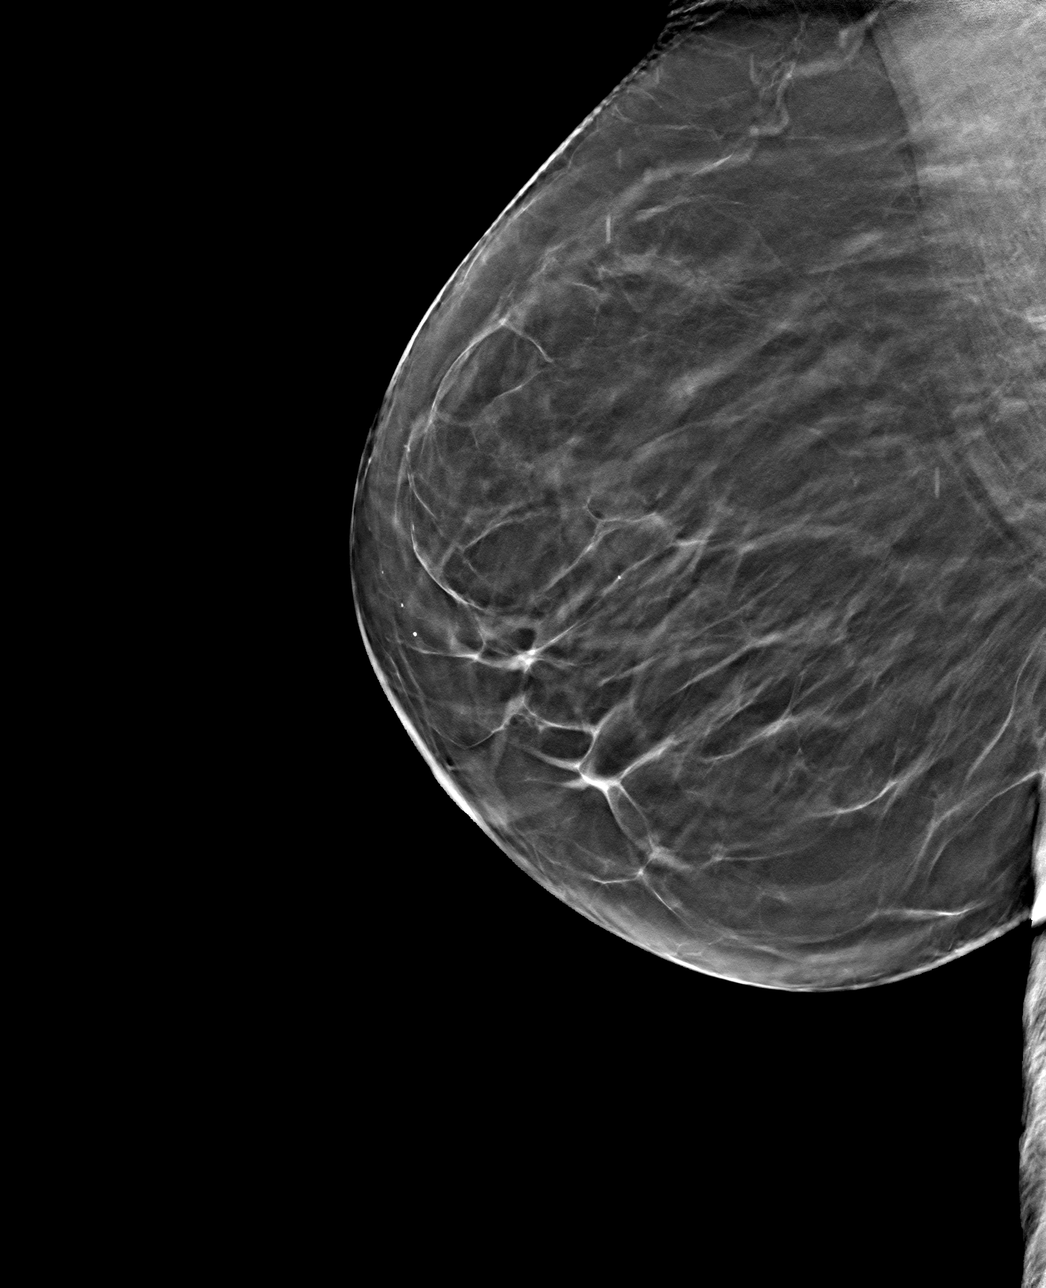

[R CC tomo · tomo slice 31/62.0]
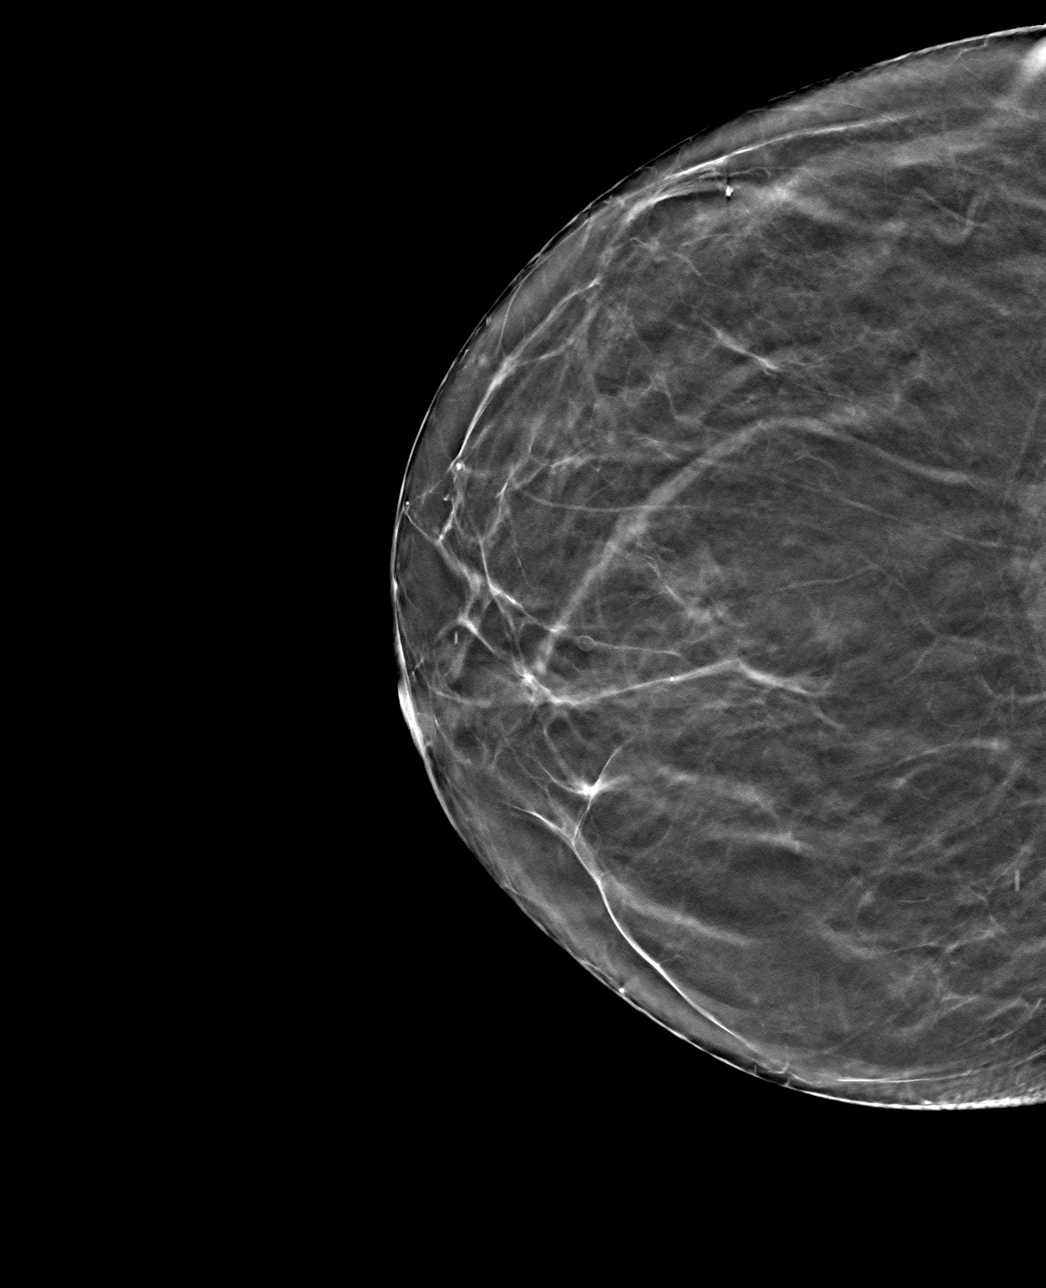

[4 of 12 positions shown; findings below may reference images not displayed]

ACR Breast Density Category b: There are scattered areas of
fibroglandular density.
FINDINGS: The patient has had a left mastectomy. There are no findings
suspicious for malignancy. Images were processed with CAD.
IMPRESSION: No mammographic evidence of malignancy. A result letter of this
screening mammogram will be mailed directly to the patient.

RECOMMENDATION:
Screening mammogram in one year.  (Code:H3-9-9K3)

BI-RADS CATEGORY  1: Negative.
